# Patient Record
Sex: Female | Born: 1950
Health system: Southern US, Community
[De-identification: ages and names within clinical notes are randomized; demographics above are authoritative.]

## PROBLEM LIST (undated history)

## (undated) DIAGNOSIS — E785 Hyperlipidemia, unspecified: Secondary | ICD-10-CM

## (undated) DIAGNOSIS — E119 Type 2 diabetes mellitus without complications: Secondary | ICD-10-CM

## (undated) DIAGNOSIS — I10 Essential (primary) hypertension: Secondary | ICD-10-CM

## (undated) HISTORY — DX: Type 2 diabetes mellitus without complications: E11.9

## (undated) HISTORY — PX: EYE SURGERY: SHX253

## (undated) HISTORY — PX: LEG SURGERY: SHX1003

## (undated) HISTORY — DX: Hyperlipidemia, unspecified: E78.5

## (undated) HISTORY — DX: Essential (primary) hypertension: I10

## (undated) HISTORY — PX: TUBAL LIGATION: SHX77

---

## 2019-08-28 ENCOUNTER — Other Ambulatory Visit: Payer: Self-pay

## 2019-08-28 ENCOUNTER — Ambulatory Visit (INDEPENDENT_AMBULATORY_CARE_PROVIDER_SITE_OTHER): Payer: Medicare HMO | Admitting: Family Medicine

## 2019-08-28 ENCOUNTER — Encounter: Payer: Self-pay | Admitting: Family Medicine

## 2019-08-28 DIAGNOSIS — R6889 Other general symptoms and signs: Secondary | ICD-10-CM | POA: Diagnosis not present

## 2019-08-28 DIAGNOSIS — Z1231 Encounter for screening mammogram for malignant neoplasm of breast: Secondary | ICD-10-CM | POA: Diagnosis not present

## 2019-08-28 DIAGNOSIS — E1149 Type 2 diabetes mellitus with other diabetic neurological complication: Secondary | ICD-10-CM | POA: Diagnosis not present

## 2019-08-28 DIAGNOSIS — Z13820 Encounter for screening for osteoporosis: Secondary | ICD-10-CM | POA: Diagnosis not present

## 2019-08-28 DIAGNOSIS — E1140 Type 2 diabetes mellitus with diabetic neuropathy, unspecified: Secondary | ICD-10-CM | POA: Diagnosis not present

## 2019-08-28 DIAGNOSIS — Z1159 Encounter for screening for other viral diseases: Secondary | ICD-10-CM | POA: Diagnosis not present

## 2019-08-28 DIAGNOSIS — E1169 Type 2 diabetes mellitus with other specified complication: Secondary | ICD-10-CM | POA: Diagnosis not present

## 2019-08-28 DIAGNOSIS — I10 Essential (primary) hypertension: Secondary | ICD-10-CM | POA: Diagnosis not present

## 2019-08-28 DIAGNOSIS — E559 Vitamin D deficiency, unspecified: Secondary | ICD-10-CM | POA: Diagnosis not present

## 2019-08-28 DIAGNOSIS — E785 Hyperlipidemia, unspecified: Secondary | ICD-10-CM

## 2019-08-28 MED ORDER — GLIPIZIDE ER 5 MG PO TB24: 5.0000 mg | ORAL_TABLET | Freq: Every day | ORAL | 1 refills | Status: DC

## 2019-08-28 MED ORDER — GABAPENTIN 300 MG PO CAPS: 300.0000 mg | ORAL_CAPSULE | Freq: Three times a day (TID) | ORAL | 1 refills | Status: DC

## 2019-08-28 MED ORDER — PRAVASTATIN SODIUM 40 MG PO TABS: 40.0000 mg | ORAL_TABLET | Freq: Every day | ORAL | 1 refills | Status: DC

## 2019-08-28 MED ORDER — FARXIGA 5 MG PO TABS: 5.0000 mg | ORAL_TABLET | Freq: Every day | ORAL | 1 refills | Status: DC

## 2019-08-28 NOTE — Progress Notes (Signed)
Subjective:  Patient ID: Ghina Bittinger, female    DOB: [birth year]  Age: 69 y.o. MRN: [id number]  CC:  Chief Complaint  Patient presents with  . New Patient (Initial Visit)    establish care      HPI  HPI   Ms Donaldson is a 69 year old female patient who recently moved up here in November 2020 from [place].  She is now living with her daughter at this time.  She reports a history of smoking, diabetes, high blood pressure, high cholesterol, vitamin deficiency in the, obesity.  She reports that she is down 25 pounds since stopping Metformin.  Decided to stop that secondary to the side effects that she saw on recalls.  Does not recall if she was on extended release or not.  Has had a leg surgery and a tibial ligation.  She reports that she is doing really well with her legs she does not have any issues.  She has no mobility issues to report.  She is unable to tell me how many cigarettes she smokes today because she rolls her own.  Occasional beer or 2 but not a weekly event per her.  She likes to crochet and play apps on her phone.  She loves to eat seafood, eggs and veggies.  Limited fast food and red meat intake.  Does drink 2 pots of coffee daily.  And will drink a lot of sweet tea.  Does not drive anymore.  Health maintenance wise she declines having vaccines.  Is open to Cologuard/colonoscopy.  Bone scan, mammogram.  Need to get her to sign a release for her records so that we can make sure that we do not repeat anything that she has had in the past.  And that we try to get her up today as much as possible.  She reports she is run out of her medications now that it has been over 3 months that she moved up here.  Needs refills on those that are provided today.  She denies having any other concerns or issues at this time.  And is willing to get updated labs as well.  Today patient denies signs and symptoms of COVID 19 infection including fever, chills, cough, shortness  of breath, and headache. Past Medical, Surgical, Social History, Allergies, and Medications have been Reviewed.   Past Medical History:  Diagnosis Date  . Diabetes mellitus without complication ([place])   . Hyperlipidemia   . Hypertension     Current Meds  Medication Sig  . FARXIGA 5 MG TABS tablet Take 5 mg by mouth daily.  . fluticasone (FLONASE) 50 MCG/ACT nasal spray Place 1 spray into both nostrils 2 (two) times daily as needed for allergies or rhinitis.  Marland Kitchen gabapentin (NEURONTIN) 300 MG capsule Take 1 capsule (300 mg total) by mouth 3 (three) times daily.  Marland Kitchen glipiZIDE (GLUCOTROL XL) 5 MG 24 hr tablet Take 1 tablet (5 mg total) by mouth daily after breakfast.  . IBU 800 MG tablet Take 800 mg by mouth 3 (three) times daily.  . pravastatin (PRAVACHOL) 40 MG tablet Take 1 tablet (40 mg total) by mouth daily.  . [DISCONTINUED] FARXIGA 5 MG TABS tablet Take 5 mg by mouth daily.  . [DISCONTINUED] gabapentin (NEURONTIN) 300 MG capsule Take 300 mg by mouth in the morning, at noon, in the evening, and at bedtime.  . [DISCONTINUED] glipiZIDE (GLUCOTROL XL) 5 MG 24 hr tablet Take 5 mg by mouth 2 (two) times daily.  . [  DISCONTINUED] pravastatin (PRAVACHOL) 40 MG tablet Take 40 mg by mouth daily.    ROS:  Review of Systems  HENT: Negative.        Dentures -bottom and top   Eyes: Negative.        Sty on eye- healing  Glasses - just readers Cataract sx on both eyes   Respiratory: Negative.   Cardiovascular: Negative.   Gastrointestinal: Negative.   Genitourinary: Negative.   Musculoskeletal: Negative.   Skin: Negative.   Neurological: Negative.   Endo/Heme/Allergies: Negative.   Psychiatric/Behavioral: Negative.   All other systems reviewed and are negative.    Objective:   Today's Vitals: BP 118/78   Pulse 86   Temp (!) 97.2 F (36.2 C) (Temporal)   Resp 15   Ht 5' 5.5" (1.664 m)   Wt 226 lb 1.3 oz (102.5 kg)   SpO2 96%   BMI 37.05 kg/m  Vitals with BMI 08/28/2019  Height  5' 5.5"  Weight 226 lbs 1 oz  BMI 37.04  Systolic 118  Diastolic 78  Pulse 86     Physical Exam Vitals and nursing note reviewed.  Constitutional:      Appearance: Normal appearance. She is well-developed and well-groomed. She is morbidly obese.  HENT:     Head: Normocephalic and atraumatic.     Comments: Mask in place    Right Ear: External ear normal.     Left Ear: External ear normal.  Eyes:     General:        Right eye: No discharge.        Left eye: No discharge.     Conjunctiva/sclera: Conjunctivae normal.  Cardiovascular:     Rate and Rhythm: Normal rate and regular rhythm.     Pulses: Normal pulses.     Heart sounds: Normal heart sounds.  Pulmonary:     Effort: Pulmonary effort is normal.     Breath sounds: Normal breath sounds.  Musculoskeletal:        General: Normal range of motion.     Cervical back: Normal range of motion and neck supple.  Skin:    General: Skin is warm.  Neurological:     General: No focal deficit present.     Mental Status: She is alert and oriented to person, place, and time.  Psychiatric:        Attention and Perception: Attention normal.        Mood and Affect: Mood normal.        Speech: Speech normal.        Behavior: Behavior normal. Behavior is cooperative.        Thought Content: Thought content normal.        Cognition and Memory: Cognition normal.        Judgment: Judgment normal.     Assessment   1. Morbid obesity (HCC)   2. Type 2 diabetes mellitus with diabetic neuropathy, without long-term current use of insulin (HCC)   3. Hyperlipidemia associated with type 2 diabetes mellitus (HCC)   4. Other diabetic neurological complication associated with type 2 diabetes mellitus (HCC)   5. Essential hypertension   6. Vitamin D deficiency   7. Encounter for hepatitis C screening test for low risk patient   8. Encounter for imaging to assess osteoporosis   9. Encounter for screening mammogram for malignant neoplasm of breast      Tests ordered Orders Placed This Encounter  Procedures  . DG Bone Density  . MM  3D SCREEN BREAST BILATERAL  . CBC  . COMPLETE METABOLIC PANEL WITH GFR  . Hemoglobin A1c  . Lipid panel  . TSH  . VITAMIN D 25 Hydroxy (Vit-D Deficiency, Fractures)  . HEP C AB W/REFL     Plan: Please see assessment and plan per problem list above.   Meds ordered this encounter  Medications  . FARXIGA 5 MG TABS tablet    Sig: Take 5 mg by mouth daily.    Dispense:  90 tablet    Refill:  1    Order Specific Question:   Supervising Provider    Answer:   SIMPSON, MARGARET E [[id number]]  . gabapentin (NEURONTIN) 300 MG capsule    Sig: Take 1 capsule (300 mg total) by mouth 3 (three) times daily.    Dispense:  270 capsule    Refill:  1    Order Specific Question:   Supervising Provider    Answer:   SIMPSON, MARGARET E [[id number]]  . glipiZIDE (GLUCOTROL XL) 5 MG 24 hr tablet    Sig: Take 1 tablet (5 mg total) by mouth daily after breakfast.    Dispense:  90 tablet    Refill:  1    Order Specific Question:   Supervising Provider    Answer:   SIMPSON, MARGARET E [[id number]]  . pravastatin (PRAVACHOL) 40 MG tablet    Sig: Take 1 tablet (40 mg total) by mouth daily.    Dispense:  90 tablet    Refill:  1    Order Specific Question:   Supervising Provider    Answer:   Kerri Perches [[id number]]    Patient to follow-up in 3 months .  Freddy Finner, NP

## 2019-08-28 NOTE — Patient Instructions (Addendum)
Happy New Year! May you have a year filled with hope, love, happiness and laughter.  I appreciate the opportunity to provide you with care for your health and wellness. Today we discussed: establish care  Follow up: 3 month *sign release form for records  Labs in the next week fasting at [facility] (same floor, at end of hallway)  Bone Scan and Mammogram placed today.  Please continue to practice social distancing to keep you, your family, and our community safe.  If you must go out, please wear a mask and practice good handwashing.  It was a pleasure to see you and I look forward to continuing to work together on your health and well-being. Please do not hesitate to call the office if you need care or have questions about your care.  Have a wonderful day and week. With Gratitude, Tereasa Coop, DNP, AGNP-BC

## 2019-08-30 ENCOUNTER — Encounter: Payer: Self-pay | Admitting: Family Medicine

## 2019-08-30 DIAGNOSIS — E1149 Type 2 diabetes mellitus with other diabetic neurological complication: Secondary | ICD-10-CM

## 2019-08-30 DIAGNOSIS — E1140 Type 2 diabetes mellitus with diabetic neuropathy, unspecified: Secondary | ICD-10-CM | POA: Insufficient documentation

## 2019-08-30 DIAGNOSIS — Z1231 Encounter for screening mammogram for malignant neoplasm of breast: Secondary | ICD-10-CM | POA: Insufficient documentation

## 2019-08-30 DIAGNOSIS — Z13820 Encounter for screening for osteoporosis: Secondary | ICD-10-CM | POA: Insufficient documentation

## 2019-08-30 DIAGNOSIS — Z1159 Encounter for screening for other viral diseases: Secondary | ICD-10-CM

## 2019-08-30 DIAGNOSIS — I10 Essential (primary) hypertension: Secondary | ICD-10-CM | POA: Insufficient documentation

## 2019-08-30 DIAGNOSIS — E1169 Type 2 diabetes mellitus with other specified complication: Secondary | ICD-10-CM | POA: Insufficient documentation

## 2019-08-30 DIAGNOSIS — E559 Vitamin D deficiency, unspecified: Secondary | ICD-10-CM | POA: Insufficient documentation

## 2019-08-30 DIAGNOSIS — E785 Hyperlipidemia, unspecified: Secondary | ICD-10-CM | POA: Insufficient documentation

## 2019-08-30 HISTORY — DX: Encounter for screening for osteoporosis: Z13.820

## 2019-08-30 HISTORY — DX: Encounter for screening mammogram for malignant neoplasm of breast: Z12.31

## 2019-08-30 HISTORY — DX: Encounter for screening for other viral diseases: Z11.59

## 2019-08-30 HISTORY — DX: Type 2 diabetes mellitus with other diabetic neurological complication: E11.49

## 2019-08-30 NOTE — Assessment & Plan Note (Signed)
Needs updated labs-will prescribe supplement if needed

## 2019-08-30 NOTE — Assessment & Plan Note (Signed)
Madison Ho is encouraged to check blood sugar daily as directed. Continue current medications. Is on statin as well. Might need ACE pending labs Educated on importance of maintain a well balanced diabetic friendly diet.  She is reminded the importance of maintaining  good blood sugars,  taking medications as directed, daily foot care, annual eye exams. Additionally educated about keeping good control over blood pressure and cholesterol as well.

## 2019-08-30 NOTE — Assessment & Plan Note (Signed)
Needs refill on medications. Provided today. Advised tight control with BS to help keep this from getting worse.

## 2019-08-30 NOTE — Assessment & Plan Note (Signed)
She is not currently on anything for this, would benefit from ACE given she is DM. She wants to wait on labs.  Will address at that time.

## 2019-08-30 NOTE — Assessment & Plan Note (Signed)
US task force recommendation 

## 2019-08-30 NOTE — Assessment & Plan Note (Signed)
  Madison Ho is educated about the importance of exercise daily to help with weight management. A minumum of 30 minutes daily is recommended. Additionally, importance of healthy food choices  with portion control discussed.  Wt Readings from Last 3 Encounters:  08/28/19 226 lb 1.3 oz (102.5 kg)

## 2019-08-30 NOTE — Assessment & Plan Note (Signed)
Continue statin, will get updated labs, encouraged a heart healthy, low fat diet. 5 days of 30 minutes of exercise recommended.

## 2019-08-30 NOTE — Assessment & Plan Note (Signed)
Does not recall last one, maybe 2 years per her. Will order update.

## 2019-08-30 NOTE — Assessment & Plan Note (Signed)
Does not recall having this. Need records and update of scan.  Has fractured ribs by lending over a drawer.  Encouraged vitamin D and Ca+ Will be getting labs as well

## 2019-09-05 DIAGNOSIS — E119 Type 2 diabetes mellitus without complications: Secondary | ICD-10-CM | POA: Diagnosis not present

## 2019-09-05 DIAGNOSIS — Z6837 Body mass index (BMI) 37.0-37.9, adult: Secondary | ICD-10-CM | POA: Diagnosis not present

## 2019-09-05 DIAGNOSIS — E6609 Other obesity due to excess calories: Secondary | ICD-10-CM | POA: Diagnosis not present

## 2019-09-05 DIAGNOSIS — Z1159 Encounter for screening for other viral diseases: Secondary | ICD-10-CM | POA: Diagnosis not present

## 2019-09-05 DIAGNOSIS — R6889 Other general symptoms and signs: Secondary | ICD-10-CM | POA: Diagnosis not present

## 2019-09-05 DIAGNOSIS — E559 Vitamin D deficiency, unspecified: Secondary | ICD-10-CM | POA: Diagnosis not present

## 2019-09-05 DIAGNOSIS — I10 Essential (primary) hypertension: Secondary | ICD-10-CM | POA: Diagnosis not present

## 2019-09-06 LAB — HEMOGLOBIN A1C
Hgb A1c MFr Bld: 10.7 % of total Hgb — ABNORMAL HIGH (ref <5.7)
Mean Plasma Glucose: 260 (calc)
eAG (mmol/L): 14.4 (calc)

## 2019-09-06 LAB — COMPLETE METABOLIC PANEL WITH GFR
AG Ratio: 1.3 (calc) (ref 1.0–2.5)
ALT: 7 U/L (ref 6–29)
AST: 8 U/L — ABNORMAL LOW (ref 10–35)
Albumin: 3.9 g/dL (ref 3.6–5.1)
Alkaline phosphatase (APISO): 82 U/L (ref 37–153)
BUN: 13 mg/dL (ref 7–25)
CO2: 33 mmol/L — ABNORMAL HIGH (ref 20–32)
Calcium: 9.4 mg/dL (ref 8.6–10.4)
Chloride: 97 mmol/L — ABNORMAL LOW (ref 98–110)
Creat: 0.85 mg/dL (ref 0.60–0.93)
GFR, Est African American: 80 mL/min/{1.73_m2} (ref >=60)
GFR, Est Non African American: 69 mL/min/{1.73_m2} (ref >=60)
Globulin: 3.1 g/dL (calc) (ref 1.9–3.7)
Glucose, Bld: 321 mg/dL — ABNORMAL HIGH (ref 65–99)
Potassium: 4.9 mmol/L (ref 3.5–5.3)
Sodium: 136 mmol/L (ref 135–146)
Total Bilirubin: 0.3 mg/dL (ref 0.2–1.2)
Total Protein: 7 g/dL (ref 6.1–8.1)

## 2019-09-06 LAB — CBC
HCT: 47 % — ABNORMAL HIGH (ref 35.0–45.0)
Hemoglobin: 15.4 g/dL (ref 11.7–15.5)
MCH: 31.1 pg (ref 27.0–33.0)
MCHC: 32.8 g/dL (ref 32.0–36.0)
MCV: 94.9 fL (ref 80.0–100.0)
MPV: 10.8 fL (ref 7.5–12.5)
Platelets: 278 10*3/uL (ref 140–400)
RBC: 4.95 10*6/uL (ref 3.80–5.10)
RDW: 13.1 % (ref 11.0–15.0)
WBC: 10.1 10*3/uL (ref 3.8–10.8)

## 2019-09-06 LAB — LIPID PANEL
Cholesterol: 200 mg/dL — ABNORMAL HIGH (ref <200)
HDL: 46 mg/dL — ABNORMAL LOW (ref >=50)
LDL Cholesterol (Calc): 131 mg/dL (calc) — ABNORMAL HIGH
Non-HDL Cholesterol (Calc): 154 mg/dL (calc) — ABNORMAL HIGH (ref <130)
Total CHOL/HDL Ratio: 4.3 (calc) (ref <5.0)
Triglycerides: 120 mg/dL (ref <150)

## 2019-09-06 LAB — HEP C AB W/REFL
HEPATITIS C ANTIBODY REFILL$(REFL): NONREACTIVE
SIGNAL TO CUT-OFF: 0.01 (ref <1.00)

## 2019-09-06 LAB — VITAMIN D 25 HYDROXY (VIT D DEFICIENCY, FRACTURES): Vit D, 25-Hydroxy: 10 ng/mL — ABNORMAL LOW (ref 30–100)

## 2019-09-06 LAB — TSH: TSH: 1.12 mIU/L (ref 0.40–4.50)

## 2019-09-06 LAB — REFLEX TIQ

## 2019-09-24 ENCOUNTER — Other Ambulatory Visit: Payer: Self-pay | Admitting: Family Medicine

## 2019-10-20 ENCOUNTER — Ambulatory Visit (INDEPENDENT_AMBULATORY_CARE_PROVIDER_SITE_OTHER): Payer: Medicare HMO

## 2019-10-20 ENCOUNTER — Other Ambulatory Visit: Payer: Self-pay

## 2019-10-20 VITALS — BP 118/78 | Ht 65.5 in | Wt 226.0 lb

## 2019-10-20 DIAGNOSIS — Z0000 Encounter for general adult medical examination without abnormal findings: Secondary | ICD-10-CM

## 2019-10-20 NOTE — Progress Notes (Deleted)
Subjective:   Madison Ho is a 69 y.o. female who presents for an Initial Medicare Annual Wellness Visit.  Review of Systems    ***        Objective:    There were no vitals filed for this visit. There is no height or weight on file to calculate BMI.  No flowsheet data found.  Current Medications (verified) Outpatient Encounter Medications as of 10/20/2019  Medication Sig  . Blood Glucose Monitoring Suppl (ACCU-CHEK AVIVA PLUS) w/Device KIT   . FARXIGA 5 MG TABS tablet Take 5 mg by mouth daily.  . fluticasone (FLONASE) 50 MCG/ACT nasal spray Place 1 spray into both nostrils 2 (two) times daily as needed for allergies or rhinitis.  Marland Kitchen gabapentin (NEURONTIN) 300 MG capsule Take 1 capsule (300 mg total) by mouth 3 (three) times daily.  Marland Kitchen glipiZIDE (GLUCOTROL XL) 5 MG 24 hr tablet Take 1 tablet (5 mg total) by mouth daily after breakfast.  . IBU 800 MG tablet Take 800 mg by mouth 3 (three) times daily.  . pravastatin (PRAVACHOL) 40 MG tablet Take 1 tablet (40 mg total) by mouth daily.   No facility-administered encounter medications on file as of 10/20/2019.    Allergies (verified) Penicillins   History: Past Medical History:  Diagnosis Date  . Diabetes mellitus without complication ([place])   . Hyperlipidemia   . Hypertension    Past Surgical History:  Procedure Laterality Date  . LEG SURGERY    . TUBAL LIGATION     Family History  Problem Relation Age of Onset  . Stroke Mother   . Heart attack Mother   . Heart attack Father    Social History   Socioeconomic History  . Marital status: Widowed    Spouse name: Not on file  . Number of children: 2  . Years of education: Not on file  . Highest education level: Bachelor's degree (e.g., BA, AB, BS)  Occupational History  . Not on file  Tobacco Use  . Smoking status: Current Every Day Smoker  . Smokeless tobacco: Never Used  Substance and Sexual Activity  . Alcohol use: Yes    Comment: occas  . Drug use:  Never  . Sexual activity: Not Currently  Other Topics Concern  . Not on file  Social History Narrative   Lives with Lynelle Smoke recently moved from [place] in nov 2020   Grandson and her son       Cats      Enjoy: crochet, play merge dragons phone apps game       Diet: loves seafood, eggs, veggies, chicken and sides, limited fast food and red meat   Caffeine: 2 pots of coffee daily, drink a lot of sweet tea, (root beer)   Water: 3 cups daily at least       Wears seat belt   Smoke and carbon monoxide detectors    Does not drive   Social Determinants of Health   Financial Resource Strain: Low Risk   . Difficulty of Paying Living Expenses: Not very hard  Food Insecurity: No Food Insecurity  . Worried About Charity fundraiser in the Last Year: Never true  . Ran Out of Food in the Last Year: Never true  Transportation Needs: No Transportation Needs  . Lack of Transportation (Medical): No  . Lack of Transportation (Non-Medical): No  Physical Activity: Inactive  . Days of Exercise per Week: 0 days  . Minutes of Exercise per Session: 0 min  Stress: No Stress Concern Present  . Feeling of Stress : Not at all  Social Connections: Moderately Isolated  . Frequency of Communication with Friends and Family: More than three times a week  . Frequency of Social Gatherings with Friends and Family: More than three times a week  . Attends Religious Services: Never  . Active Member of Clubs or Organizations: No  . Attends Archivist Meetings: Never  . Marital Status: Widowed    Tobacco Counseling Ready to quit: Not Answered Counseling given: Not Answered   Clinical Intake:                        Activities of Daily Living No flowsheet data found.   Immunizations and Health Maintenance  There is no immunization history on file for this patient. Health Maintenance Due  Topic Date Due  . Hepatitis C Screening  Never done  . FOOT EXAM  Never done  . OPHTHALMOLOGY  EXAM  Never done  . URINE MICROALBUMIN  Never done  . TETANUS/TDAP  Never done  . MAMMOGRAM  Never done  . COLONOSCOPY  Never done  . DEXA SCAN  Never done    Patient Care Team: Perlie Mayo, NP as PCP - General (Family Medicine)  Indicate any recent Medical Services you may have received from other than [facility] providers in the past year (date may be approximate).     Assessment:   This is a routine wellness examination for [name].  Hearing/Vision screen No exam data present  Dietary issues and exercise activities discussed:    Goals   None    Depression Screen PHQ 2/9 Scores 08/28/2019  PHQ - 2 Score 0    Fall Risk Fall Risk  08/28/2019  Falls in the past year? 1  Number falls in past yr: 1  Injury with Fall? 1    Is the patient's home free of loose throw rugs in walkways, pet beds, electrical cords, etc?   {Blank single:19197::"yes","no"}      Grab bars in the bathroom? {Blank single:19197::"yes","no"}      Handrails on the stairs?   {Blank single:19197::"yes","no"}      Adequate lighting?   {Blank single:19197::"yes","no"}  Timed Get Up and Go Performed ***  Cognitive Function:        Screening Tests Health Maintenance  Topic Date Due  . Hepatitis C Screening  Never done  . FOOT EXAM  Never done  . OPHTHALMOLOGY EXAM  Never done  . URINE MICROALBUMIN  Never done  . TETANUS/TDAP  Never done  . MAMMOGRAM  Never done  . COLONOSCOPY  Never done  . DEXA SCAN  Never done  . INFLUENZA VACCINE  10/29/2019 (Originally 03/01/2019)  . PNA vac Low Risk Adult (1 of 2 - PCV13) 08/27/2020 (Originally 06/19/2014)  . HEMOGLOBIN A1C  03/04/2020    Qualifies for Shingles Vaccine? ***  Cancer Screenings: Lung: Low Dose CT Chest recommended if Age 56-80 years, 30 pack-year currently smoking OR have quit w/in 15years. Patient {DOES NOT does:27190::"does not"} qualify. Breast: Up to date on Mammogram? {Yes/No:30480221}   Up to date of Bone Density/Dexa?  {Yes/No:30480221} Colorectal: ***  Additional Screenings: *** Hepatitis C Screening:      Plan:   ***  I have personally reviewed and noted the following in the patient's chart:   . Medical and social history . Use of alcohol, tobacco or illicit drugs  . Current medications and supplements . Functional ability  and status . Nutritional status . Physical activity . Advanced directives . List of other physicians . Hospitalizations, surgeries, and ER visits in previous 12 months . Vitals . Screenings to include cognitive, depression, and falls . Referrals and appointments  In addition, I have reviewed and discussed with patient certain preventive protocols, quality metrics, and best practice recommendations. A written personalized care plan for preventive services as well as general preventive health recommendations were provided to patient.     Kate Sable, LPN, LPN   [id number]

## 2019-11-10 ENCOUNTER — Ambulatory Visit (INDEPENDENT_AMBULATORY_CARE_PROVIDER_SITE_OTHER): Payer: Medicare HMO

## 2019-11-10 ENCOUNTER — Other Ambulatory Visit: Payer: Self-pay

## 2019-11-10 VITALS — BP 118/78 | Ht 65.5 in | Wt 226.0 lb

## 2019-11-10 DIAGNOSIS — Z0000 Encounter for general adult medical examination without abnormal findings: Secondary | ICD-10-CM | POA: Diagnosis not present

## 2019-11-10 MED ORDER — FLUTICASONE PROPIONATE 50 MCG/ACT NA SUSP: 1.0000 | Freq: Two times a day (BID) | NASAL | 1 refills | Status: DC | PRN

## 2019-11-10 NOTE — Patient Instructions (Signed)
Madison Ho , Thank you for taking time to come for your Medicare Wellness Visit. I appreciate your ongoing commitment to your health goals. Please review the following plan we discussed and let me know if I can assist you in the future.   Screening recommendations/referrals: Colonoscopy: need to sign records release  Mammogram:  Needs to schedule mammo for mid May  Bone Density: will call if decides she wants Recommended yearly ophthalmology/optometry visit for glaucoma screening and checkup Recommended yearly dental visit for hygiene and checkup  Vaccinations: Influenza vaccine: declined  Pneumococcal vaccine: declined  Tdap vaccine: declined Shingles vaccine: declined   Advanced directives: will mail with avs   Conditions/risks identified: fall risk  Next appointment: 11/26/2019 1pm   Preventive Care 65 Years and Older, Female Preventive care refers to lifestyle choices and visits with your health care provider that can promote health and wellness. What does preventive care include?  A yearly physical exam. This is also called an annual well check.  Dental exams once or twice a year.  Routine eye exams. Ask your health care provider how often you should have your eyes checked.  Personal lifestyle choices, including:  Daily care of your teeth and gums.  Regular physical activity.  Eating a healthy diet.  Avoiding tobacco and drug use.  Limiting alcohol use.  Practicing safe sex.  Taking low-dose aspirin every day.  Taking vitamin and mineral supplements as recommended by your health care provider. What happens during an annual well check? The services and screenings done by your health care provider during your annual well check will depend on your age, overall health, lifestyle risk factors, and family history of disease. Counseling  Your health care provider may ask you questions about your:  Alcohol use.  Tobacco use.  Drug use.  Emotional  well-being.  Home and relationship well-being.  Sexual activity.  Eating habits.  History of falls.  Memory and ability to understand (cognition).  Work and work Astronomer.  Reproductive health. Screening  You may have the following tests or measurements:  Height, weight, and BMI.  Blood pressure.  Lipid and cholesterol levels. These may be checked every 5 years, or more frequently if you are over 60 years old.  Skin check.  Lung cancer screening. You may have this screening every year starting at age 6 if you have a 30-pack-year history of smoking and currently smoke or have quit within the past 15 years.  Fecal occult blood test (FOBT) of the stool. You may have this test every year starting at age 56.  Flexible sigmoidoscopy or colonoscopy. You may have a sigmoidoscopy every 5 years or a colonoscopy every 10 years starting at age 33.  Hepatitis C blood test.  Hepatitis B blood test.  Sexually transmitted disease (STD) testing.  Diabetes screening. This is done by checking your blood sugar (glucose) after you have not eaten for a while (fasting). You may have this done every 1-3 years.  Bone density scan. This is done to screen for osteoporosis. You may have this done starting at age 35.  Mammogram. This may be done every 1-2 years. Talk to your health care provider about how often you should have regular mammograms. Talk with your health care provider about your test results, treatment options, and if necessary, the need for more tests. Vaccines  Your health care provider may recommend certain vaccines, such as:  Influenza vaccine. This is recommended every year.  Tetanus, diphtheria, and acellular pertussis (Tdap, Td) vaccine. You  may need a Td booster every 10 years.  Zoster vaccine. You may need this after age 27.  Pneumococcal 13-valent conjugate (PCV13) vaccine. One dose is recommended after age 30.  Pneumococcal polysaccharide (PPSV23) vaccine. One  dose is recommended after age 65. Talk to your health care provider about which screenings and vaccines you need and how often you need them. This information is not intended to replace advice given to you by your health care provider. Make sure you discuss any questions you have with your health care provider. Document Released: 08/13/2015 Document Revised: 04/05/2016 Document Reviewed: 05/18/2015 Elsevier Interactive Patient Education  2017 [place] Prevention in the Home Falls can cause injuries. They can happen to people of all ages. There are many things you can do to make your home safe and to help prevent falls. What can I do on the outside of my home?  Regularly fix the edges of walkways and driveways and fix any cracks.  Remove anything that might make you trip as you walk through a door, such as a raised step or threshold.  Trim any bushes or trees on the path to your home.  Use bright outdoor lighting.  Clear any walking paths of anything that might make someone trip, such as rocks or tools.  Regularly check to see if handrails are loose or broken. Make sure that both sides of any steps have handrails.  Any raised decks and porches should have guardrails on the edges.  Have any leaves, snow, or ice cleared regularly.  Use sand or salt on walking paths during winter.  Clean up any spills in your garage right away. This includes oil or grease spills. What can I do in the bathroom?  Use night lights.  Install grab bars by the toilet and in the tub and shower. Do not use towel bars as grab bars.  Use non-skid mats or decals in the tub or shower.  If you need to sit down in the shower, use a plastic, non-slip stool.  Keep the floor dry. Clean up any water that spills on the floor as soon as it happens.  Remove soap buildup in the tub or shower regularly.  Attach bath mats securely with double-sided non-slip rug tape.  Do not have throw rugs and other  things on the floor that can make you trip. What can I do in the bedroom?  Use night lights.  Make sure that you have a light by your bed that is easy to reach.  Do not use any sheets or blankets that are too big for your bed. They should not hang down onto the floor.  Have a firm chair that has side arms. You can use this for support while you get dressed.  Do not have throw rugs and other things on the floor that can make you trip. What can I do in the kitchen?  Clean up any spills right away.  Avoid walking on wet floors.  Keep items that you use a lot in easy-to-reach places.  If you need to reach something above you, use a strong step stool that has a grab bar.  Keep electrical cords out of the way.  Do not use floor polish or wax that makes floors slippery. If you must use wax, use non-skid floor wax.  Do not have throw rugs and other things on the floor that can make you trip. What can I do with my stairs?  Do not leave any items  on the stairs.  Make sure that there are handrails on both sides of the stairs and use them. Fix handrails that are broken or loose. Make sure that handrails are as long as the stairways.  Check any carpeting to make sure that it is firmly attached to the stairs. Fix any carpet that is loose or worn.  Avoid having throw rugs at the top or bottom of the stairs. If you do have throw rugs, attach them to the floor with carpet tape.  Make sure that you have a light switch at the top of the stairs and the bottom of the stairs. If you do not have them, ask someone to add them for you. What else can I do to help prevent falls?  Wear shoes that:  Do not have high heels.  Have rubber bottoms.  Are comfortable and fit you well.  Are closed at the toe. Do not wear sandals.  If you use a stepladder:  Make sure that it is fully opened. Do not climb a closed stepladder.  Make sure that both sides of the stepladder are locked into place.  Ask  someone to hold it for you, if possible.  Clearly mark and make sure that you can see:  Any grab bars or handrails.  First and last steps.  Where the edge of each step is.  Use tools that help you move around (mobility aids) if they are needed. These include:  Canes.  Walkers.  Scooters.  Crutches.  Turn on the lights when you go into a dark area. Replace any light bulbs as soon as they burn out.  Set up your furniture so you have a clear path. Avoid moving your furniture around.  If any of your floors are uneven, fix them.  If there are any pets around you, be aware of where they are.  Review your medicines with your doctor. Some medicines can make you feel dizzy. This can increase your chance of falling. Ask your doctor what other things that you can do to help prevent falls. This information is not intended to replace advice given to you by your health care provider. Make sure you discuss any questions you have with your health care provider. Document Released: 05/13/2009 Document Revised: 12/23/2015 Document Reviewed: 08/21/2014 Elsevier Interactive Patient Education  2017 [employer].

## 2019-11-10 NOTE — Progress Notes (Signed)
Subjective:   Madison Ho is a 69 y.o. female who presents for an Initial Medicare Annual Wellness Visit.  Review of Systems      Cardiac Risk Factors include: advanced age (>57mn, >>35women);diabetes mellitus;dyslipidemia;hypertension;obesity (BMI >30kg/m2);sedentary lifestyle;smoking/ tobacco exposure     Objective:    Today's Vitals   11/10/19 1616 11/10/19 1638  BP: 118/78   Weight: 226 lb (102.5 kg)   Height: 5' 5.5" (1.664 m)   PainSc: 0-No pain 0-No pain   Body mass index is 37.04 kg/m.  No flowsheet data found.  Current Medications (verified) Outpatient Encounter Medications as of 11/10/2019  Medication Sig  . Blood Glucose Monitoring Suppl (ACCU-CHEK AVIVA PLUS) w/Device KIT   . FARXIGA 5 MG TABS tablet Take 5 mg by mouth daily.  . fluticasone (FLONASE) 50 MCG/ACT nasal spray Place 1 spray into both nostrils 2 (two) times daily as needed for allergies or rhinitis.  .Marland Kitchengabapentin (NEURONTIN) 300 MG capsule Take 1 capsule (300 mg total) by mouth 3 (three) times daily.  .Marland KitchenglipiZIDE (GLUCOTROL XL) 5 MG 24 hr tablet Take 1 tablet (5 mg total) by mouth daily after breakfast.  . IBU 800 MG tablet Take 800 mg by mouth 3 (three) times daily.  . pravastatin (PRAVACHOL) 40 MG tablet Take 1 tablet (40 mg total) by mouth daily.  . [DISCONTINUED] fluticasone (FLONASE) 50 MCG/ACT nasal spray Place 1 spray into both nostrils 2 (two) times daily as needed for allergies or rhinitis.   No facility-administered encounter medications on file as of 11/10/2019.    Allergies (verified) Penicillins   History: Past Medical History:  Diagnosis Date  . Diabetes mellitus without complication ([place]   . Hyperlipidemia   . Hypertension    Past Surgical History:  Procedure Laterality Date  . LEG SURGERY    . TUBAL LIGATION     Family History  Problem Relation Age of Onset  . Stroke Mother   . Heart attack Mother   . Heart attack Father    Social History   Socioeconomic  History  . Marital status: Widowed    Spouse name: Not on file  . Number of children: 2  . Years of education: Not on file  . Highest education level: Bachelor's degree (e.g., BA, AB, BS)  Occupational History  . Not on file  Tobacco Use  . Smoking status: Current Every Day Smoker  . Smokeless tobacco: Never Used  Substance and Sexual Activity  . Alcohol use: Yes    Comment: occas  . Drug use: Never  . Sexual activity: Not Currently  Other Topics Concern  . Not on file  Social History Narrative   [place]   Grandson and her son       Cats      Enjoy: crochet, play merge dragons phone apps game       Diet: loves seafood, eggs, veggies, chicken and sides, limited fast food and red meat   Caffeine: 2 pots of coffee daily, drink a lot of sweet tea, (root beer)   Water: 3 cups daily at least       Wears seat belt   Ho and carbon monoxide detectors    Does not drive   Social Determinants of Health   Financial Resource Strain: Low Risk   . Difficulty of Paying Living Expenses: Not very hard  Food Insecurity: No Food Insecurity  . Worried About RCharity fundraiserin the  Last Year: Never true  . Ran Out of Food in the Last Year: Never true  Transportation Needs: No Transportation Needs  . Lack of Transportation (Medical): No  . Lack of Transportation (Non-Medical): No  Physical Activity: Insufficiently Active  . Days of Exercise per Week: 2 days  . Minutes of Exercise per Session: 10 min  Stress: No Stress Concern Present  . Feeling of Stress : Not at all  Social Connections: Moderately Isolated  . Frequency of Communication with Friends and Family: More than three times a week  . Frequency of Social Gatherings with Friends and Family: More than three times a week  . Attends Religious Services: Never  . Active Member of Clubs or Organizations: No  . Attends Archivist Meetings: Never  . Marital Status: Widowed     Tobacco Counseling Ready to quit: Not Answered Counseling given: Not Answered   Clinical Intake:  Pre-visit preparation completed: No  Pain : No/denies pain Pain Score: 0-No pain     Nutritional Status: BMI > 30  Obese Diabetes: Yes CBG done?: No Did pt. bring in CBG monitor from home?: No  How often do you need to have someone help you when you read instructions, pamphlets, or other written materials from your doctor or pharmacy?: 1 - Never         Activities of Daily Living In your present state of health, do you have any difficulty performing the following activities: 11/10/2019  Hearing? N  Vision? N  Difficulty concentrating or making decisions? N  Walking or climbing stairs? Y  Dressing or bathing? N  Doing errands, shopping? N  Preparing Food and eating ? N  Using the Toilet? N  In the past six months, have you accidently leaked urine? N  Do you have problems with loss of bowel control? N  Managing your Medications? N  Managing your Finances? N  Housekeeping or managing your Housekeeping? N     Immunizations and Health Maintenance  There is no immunization history on file for this patient. Health Maintenance Due  Topic Date Due  . Hepatitis C Screening  Never done  . FOOT EXAM  Never done  . OPHTHALMOLOGY EXAM  Never done  . URINE MICROALBUMIN  Never done  . TETANUS/TDAP  Never done  . MAMMOGRAM  Never done  . COLONOSCOPY  Never done  . DEXA SCAN  Never done    Patient Care Team: Perlie Mayo, NP as PCP - General (Family Medicine)  Indicate any recent Medical Services you may have received from other than [facility] providers in the past year (date may be approximate).     Assessment:   This is a routine wellness examination for Madison Ho.  Hearing/Vision screen No exam data present  Dietary issues and exercise activities discussed: Current Exercise Habits: Home exercise routine, Time (Minutes): 15, Frequency (Times/Week): 3, Weekly Exercise  (Minutes/Week): 45, Intensity: Mild  Goals    . DIET - EAT MORE FRUITS AND VEGETABLES    . DIET - INCREASE WATER INTAKE    . LIFESTYLE - DECREASE FALLS RISK      Depression Screen PHQ 2/9 Scores 11/10/2019 08/28/2019  PHQ - 2 Score 0 0    Fall Risk Fall Risk  11/10/2019 08/28/2019  Falls in the past year? 1 1  Number falls in past yr: 0 1  Injury with Fall? 0 1    Is the patient's home free of loose throw rugs in walkways, pet beds, electrical cords,  etc?   no      Grab bars in the bathroom? yes      Handrails on the stairs?   yes      Adequate lighting?   yes  Timed Get Up and Go Performed virtual visit   Cognitive Function:     6CIT Screen 11/10/2019  What Year? 0 points  What month? 0 points  What time? 0 points  Count back from 20 0 points  Months in reverse 2 points  Repeat phrase 0 points  Total Score 2    Screening Tests Health Maintenance  Topic Date Due  . Hepatitis C Screening  Never done  . FOOT EXAM  Never done  . OPHTHALMOLOGY EXAM  Never done  . URINE MICROALBUMIN  Never done  . TETANUS/TDAP  Never done  . MAMMOGRAM  Never done  . COLONOSCOPY  Never done  . DEXA SCAN  Never done  . PNA vac Low Risk Adult (1 of 2 - PCV13) 08/27/2020 (Originally 06/19/2014)  . INFLUENZA VACCINE  02/29/2020  . HEMOGLOBIN A1C  03/04/2020    Qualifies for Shingles Vaccine? declined  Cancer Screenings: Lung: Low Dose CT Chest recommended if Age 40-80 years, 30 pack-year currently smoking OR have quit w/in 15years. Patient does qualify. Breast: Up to date on Mammogram? Yes   Up to date of Bone Density/Dexa? Yes Colorectal:   Additional Screenings: Hepatitis C Screening: does not want     Plan:    I have personally reviewed and noted the following in the patient's chart:   . Medical and social history . Use of alcohol, tobacco or illicit drugs  . Current medications and supplements . Functional ability and status . Nutritional status . Physical activity .  Advanced directives . List of other physicians . Hospitalizations, surgeries, and ER visits in previous 12 months . Vitals . Screenings to include cognitive, depression, and falls . Referrals and appointments  In addition, I have reviewed and discussed with patient certain preventive protocols, quality metrics, and best practice recommendations. A written personalized care plan for preventive services as well as general preventive health recommendations were provided to patient.     Kate Sable, LPN, LPN   [id number]

## 2019-11-11 ENCOUNTER — Telehealth: Payer: Self-pay

## 2019-11-11 NOTE — Telephone Encounter (Signed)
Has appt at the end of the month and at her AWV yesterday she wanted me to see if you wanted her to go back on the metformin because her sugars were still elevated and you had taken her off of it and she wasn't sure why.

## 2019-11-11 NOTE — Telephone Encounter (Signed)
She said she was taking both the glipizide and the farxiga.

## 2019-11-11 NOTE — Telephone Encounter (Signed)
In review, she was not on this at her first visit with me.I did not stop it from the chart she has never been on it from the Epic side of things. I am happy to restart her on this, if she would like. Is she taking the glipizide?

## 2019-11-11 NOTE — Telephone Encounter (Signed)
Lets start metformin 500 mg TID with meals.

## 2019-11-12 ENCOUNTER — Other Ambulatory Visit: Payer: Self-pay

## 2019-11-12 DIAGNOSIS — E1149 Type 2 diabetes mellitus with other diabetic neurological complication: Secondary | ICD-10-CM

## 2019-11-12 MED ORDER — METFORMIN HCL 500 MG PO TABS: 500.0000 mg | ORAL_TABLET | Freq: Three times a day (TID) | ORAL | 1 refills | Status: DC

## 2019-11-12 NOTE — Telephone Encounter (Signed)
Med sent and messsage left for her and lab order mailed

## 2019-11-26 ENCOUNTER — Ambulatory Visit: Payer: Medicare HMO | Admitting: Family Medicine

## 2019-12-04 ENCOUNTER — Encounter: Payer: Self-pay | Admitting: Family Medicine

## 2019-12-04 ENCOUNTER — Ambulatory Visit: Payer: Medicare HMO | Admitting: Family Medicine

## 2019-12-14 DIAGNOSIS — I87312 Chronic venous hypertension (idiopathic) with ulcer of left lower extremity: Secondary | ICD-10-CM | POA: Diagnosis not present

## 2019-12-14 DIAGNOSIS — I87313 Chronic venous hypertension (idiopathic) with ulcer of bilateral lower extremity: Secondary | ICD-10-CM | POA: Diagnosis not present

## 2019-12-14 DIAGNOSIS — I87311 Chronic venous hypertension (idiopathic) with ulcer of right lower extremity: Secondary | ICD-10-CM | POA: Diagnosis not present

## 2019-12-14 DIAGNOSIS — L97909 Non-pressure chronic ulcer of unspecified part of unspecified lower leg with unspecified severity: Secondary | ICD-10-CM | POA: Diagnosis not present

## 2019-12-14 DIAGNOSIS — I83009 Varicose veins of unspecified lower extremity with ulcer of unspecified site: Secondary | ICD-10-CM | POA: Diagnosis not present

## 2019-12-14 DIAGNOSIS — L03115 Cellulitis of right lower limb: Secondary | ICD-10-CM | POA: Diagnosis not present

## 2019-12-14 DIAGNOSIS — E119 Type 2 diabetes mellitus without complications: Secondary | ICD-10-CM | POA: Diagnosis not present

## 2019-12-14 DIAGNOSIS — L03116 Cellulitis of left lower limb: Secondary | ICD-10-CM | POA: Diagnosis not present

## 2019-12-14 DIAGNOSIS — L0390 Cellulitis, unspecified: Secondary | ICD-10-CM | POA: Diagnosis not present

## 2019-12-15 ENCOUNTER — Other Ambulatory Visit: Payer: Self-pay

## 2019-12-15 ENCOUNTER — Ambulatory Visit (HOSPITAL_COMMUNITY)
Admission: RE | Admit: 2019-12-15 | Discharge: 2019-12-15 | Disposition: A | Discharge status: Home / Self Care | Payer: Medicare HMO | Source: Ambulatory Visit | Attending: Family Medicine | Admitting: Family Medicine

## 2019-12-15 DIAGNOSIS — Z1231 Encounter for screening mammogram for malignant neoplasm of breast: Secondary | ICD-10-CM | POA: Insufficient documentation

## 2019-12-15 DIAGNOSIS — R6889 Other general symptoms and signs: Secondary | ICD-10-CM | POA: Diagnosis not present

## 2019-12-17 ENCOUNTER — Ambulatory Visit (INDEPENDENT_AMBULATORY_CARE_PROVIDER_SITE_OTHER): Payer: Medicare HMO | Admitting: Family Medicine

## 2019-12-17 ENCOUNTER — Encounter: Payer: Self-pay | Admitting: Family Medicine

## 2019-12-17 ENCOUNTER — Other Ambulatory Visit: Payer: Self-pay

## 2019-12-17 VITALS — BP 133/75 | HR 79 | Temp 97.1°F | Ht 65.5 in | Wt 233.0 lb

## 2019-12-17 DIAGNOSIS — L97219 Non-pressure chronic ulcer of right calf with unspecified severity: Secondary | ICD-10-CM | POA: Diagnosis not present

## 2019-12-17 DIAGNOSIS — E1149 Type 2 diabetes mellitus with other diabetic neurological complication: Secondary | ICD-10-CM | POA: Diagnosis not present

## 2019-12-17 DIAGNOSIS — R6889 Other general symptoms and signs: Secondary | ICD-10-CM | POA: Diagnosis not present

## 2019-12-17 DIAGNOSIS — I10 Essential (primary) hypertension: Secondary | ICD-10-CM

## 2019-12-17 DIAGNOSIS — I83012 Varicose veins of right lower extremity with ulcer of calf: Secondary | ICD-10-CM

## 2019-12-17 NOTE — Progress Notes (Signed)
Subjective:  Patient ID: Madison Ho, female    DOB: May 15, 1951  Age: 69 y.o. MRN: [id number]  CC:  Chief Complaint  Patient presents with  . Follow-up    3 month follow up pt right leg has open sore on it was seen in ER 12-15-19 was given an antibiotic but was told to follow up by primary also needs a walker with a seat as she gives out easily       HPI  HPI Ms. Person is a 69 year old female patient of mine. Who presents today after being seen in [place] [facility]. [place]. She presented to the ED stating that the wound to the right lower leg with purulent drainage has had it since 2018 but it opened back up a week ago. Had right lower extremity venous stasis ulcer. In the past she been treated with Cipro. She also has chronic peripheral edema. She opted not to get IV treatment and to do trial of outpatient treatment. She received intramuscular antibiotics followed by p.o. outpatient antibiotics. Which she reports that she has not started yet. She was given clindamycin IM and a prescription.  She reports taking all of her medications as directed and without any issue.   Today patient denies signs and symptoms of COVID 19 infection including fever, chills, cough, shortness of breath, and headache. Past Medical, Surgical, Social History, Allergies, and Medications have been Reviewed.   Past Medical History:  Diagnosis Date  . Diabetes mellitus without complication ([place])   . Hyperlipidemia   . Hypertension     Current Meds  Medication Sig  . Blood Glucose Monitoring Suppl (ACCU-CHEK AVIVA PLUS) w/Device KIT   . clindamycin (CLEOCIN) 150 MG capsule Take by mouth.  Marland Kitchen FARXIGA 5 MG TABS tablet Take 5 mg by mouth daily.  . fluticasone (FLONASE) 50 MCG/ACT nasal spray Place 1 spray into both nostrils 2 (two) times daily as needed for allergies or rhinitis.  Marland Kitchen gabapentin (NEURONTIN) 300 MG capsule Take 1 capsule (300 mg total) by mouth 3 (three) times daily.  Marland Kitchen glipiZIDE  (GLUCOTROL XL) 5 MG 24 hr tablet Take 1 tablet (5 mg total) by mouth daily after breakfast.  . IBU 800 MG tablet Take 800 mg by mouth 3 (three) times daily.  . metFORMIN (GLUCOPHAGE) 500 MG tablet Take 1 tablet (500 mg total) by mouth with breakfast, with lunch, and with evening meal.  . pravastatin (PRAVACHOL) 40 MG tablet Take 1 tablet (40 mg total) by mouth daily.    ROS:  Review of Systems  Constitutional: Negative.   HENT: Negative.   Eyes: Negative.   Respiratory: Negative.   Cardiovascular: Negative.   Gastrointestinal: Negative.   Genitourinary: Negative.   Musculoskeletal:       See HPI  Skin: Negative.   Neurological: Negative.   Endo/Heme/Allergies: Negative.   Psychiatric/Behavioral: Negative.   All other systems reviewed and are negative.    Objective:   Today's Vitals: BP 133/75 (BP Location: Right Arm, Patient Position: Sitting, Cuff Size: Normal)   Pulse 79   Temp (!) 97.1 F (36.2 C) (Temporal)   Ht 5' 5.5" (1.664 m)   Wt 233 lb (105.7 kg)   SpO2 91%   BMI 38.18 kg/m  Vitals with BMI 12/17/2019 11/10/2019 10/20/2019  Height 5' 5.5" 5' 5.5" 5' 5.5"  Weight 233 lbs 226 lbs 226 lbs  BMI 38.17 22.48 25.00  Systolic 370 488 891  Diastolic 75 78 78  Pulse 79 - -  Physical Exam Vitals and nursing note reviewed.  Constitutional:      Appearance: Normal appearance. She is well-developed and well-groomed. She is obese.  HENT:     Head: Normocephalic and atraumatic.     Right Ear: External ear normal.     Left Ear: External ear normal.     Mouth/Throat:     Comments: Mask in place Eyes:     General:        Right eye: No discharge.        Left eye: No discharge.     Conjunctiva/sclera: Conjunctivae normal.  Cardiovascular:     Rate and Rhythm: Normal rate and regular rhythm.     Pulses: Normal pulses.     Heart sounds: Normal heart sounds.  Pulmonary:     Effort: Pulmonary effort is normal.     Breath sounds: Normal breath sounds.   Musculoskeletal:        General: Normal range of motion.     Cervical back: Normal range of motion and neck supple.  Skin:    General: Skin is warm.     Comments: Bilateral lower extremity chronic edema with venous stasis ulcer in the right lower extremity draining with small surrounding cellulitis  Neurological:     General: No focal deficit present.     Mental Status: She is alert and oriented to person, place, and time.  Psychiatric:        Attention and Perception: Attention normal.        Mood and Affect: Mood normal.        Speech: Speech normal.        Behavior: Behavior normal. Behavior is cooperative.        Thought Content: Thought content normal.        Cognition and Memory: Cognition normal.        Judgment: Judgment normal.      Assessment   1. Venous stasis ulcer of right calf, unspecified ulcer stage, unspecified whether varicose veins present ([place])   2. Essential hypertension   3. Type 2 diabetes mellitus with neurological complications ([place])     Tests ordered Orders Placed This Encounter  Procedures  . For home use only DME 4 wheeled rolling walker with seat ([id number])     Plan: Please see assessment and plan per problem list above.   No orders of the defined types were placed in this encounter.   Patient to follow-up in 01/16/2020   Perlie Mayo, NP

## 2019-12-17 NOTE — Patient Instructions (Signed)
I appreciate the opportunity to provide you with care for your health and wellness. Today we discussed: leg wound  Follow up: 30 days for leg wound  No labs or referrals today  Take medications for leg infection. If worse go to ED infection: fevers, chills, redness going up leg  Avoid cream and lotion.  Please continue to practice social distancing to keep you, your family, and our community safe.  If you must go out, please wear a mask and practice good handwashing.  It was a pleasure to see you and I look forward to continuing to work together on your health and well-being. Please do not hesitate to call the office if you need care or have questions about your care.  Have a wonderful day and week. With Gratitude, Tereasa Coop, DNP, AGNP-BC

## 2019-12-19 NOTE — Assessment & Plan Note (Addendum)
Encouraged to continue checking blood sugar daily. Is on a statin medication. Would like to get her on an ACE inhibitor. Reports she would like to wait till next appointment to do this.  She is reminded to maintain good blood sugars, take medication as directed, daily foot care, annual eye exams. And to maintain good blood pressure

## 2019-12-19 NOTE — Assessment & Plan Note (Signed)
Treatment given at the emergency room. We will follow-up in 30 days to see if she is started to heal. She declined wanting any wound care or referral to vascular surgery.

## 2019-12-19 NOTE — Assessment & Plan Note (Signed)
She is not currently taking anything would benefit from an ACE given she is a diabetic she wants to wait for the next appointment and will address at that time. This is already been delayed by several months.

## 2019-12-24 ENCOUNTER — Encounter: Payer: Self-pay | Admitting: Family Medicine

## 2019-12-24 NOTE — Telephone Encounter (Signed)
Was seen on 5/19 and wants to know what you want to do about her leg ulcer

## 2019-12-29 DIAGNOSIS — L97911 Non-pressure chronic ulcer of unspecified part of right lower leg limited to breakdown of skin: Secondary | ICD-10-CM | POA: Diagnosis not present

## 2019-12-29 DIAGNOSIS — L97919 Non-pressure chronic ulcer of unspecified part of right lower leg with unspecified severity: Secondary | ICD-10-CM | POA: Diagnosis not present

## 2019-12-29 DIAGNOSIS — R600 Localized edema: Secondary | ICD-10-CM | POA: Diagnosis not present

## 2019-12-29 DIAGNOSIS — R229 Localized swelling, mass and lump, unspecified: Secondary | ICD-10-CM | POA: Diagnosis not present

## 2020-01-13 ENCOUNTER — Other Ambulatory Visit: Payer: Self-pay

## 2020-01-13 MED ORDER — UNABLE TO FIND: 0 refills | Status: DC

## 2020-01-16 ENCOUNTER — Ambulatory Visit: Payer: Medicare HMO | Admitting: Family Medicine

## 2020-01-22 ENCOUNTER — Other Ambulatory Visit: Payer: Self-pay

## 2020-01-22 ENCOUNTER — Ambulatory Visit (INDEPENDENT_AMBULATORY_CARE_PROVIDER_SITE_OTHER): Payer: Medicare HMO | Admitting: Family Medicine

## 2020-01-22 ENCOUNTER — Encounter: Payer: Self-pay | Admitting: Family Medicine

## 2020-01-22 VITALS — BP 134/73 | HR 73 | Temp 97.3°F | Ht 65.0 in | Wt 229.1 lb

## 2020-01-22 DIAGNOSIS — I83012 Varicose veins of right lower extremity with ulcer of calf: Secondary | ICD-10-CM | POA: Diagnosis not present

## 2020-01-22 DIAGNOSIS — L97219 Non-pressure chronic ulcer of right calf with unspecified severity: Secondary | ICD-10-CM

## 2020-01-22 DIAGNOSIS — R6889 Other general symptoms and signs: Secondary | ICD-10-CM | POA: Diagnosis not present

## 2020-01-22 NOTE — Patient Instructions (Addendum)
I appreciate the opportunity to provide you with care for your health and wellness. Today we discussed: leg wound   Follow up: 8 weeks   No labs  Referrals today wound clinic in [facility] for Dan Humphreys resent to [facility]   Please continue to keep the wound clean and dressed.  Please continue to practice social distancing to keep you, your family, and our community safe.  If you must go out, please wear a mask and practice good handwashing.  It was a pleasure to see you and I look forward to continuing to work together on your health and well-being. Please do not hesitate to call the office if you need care or have questions about your care.  Have a wonderful day and week. With Gratitude, Tereasa Coop, DNP, AGNP-BC

## 2020-01-22 NOTE — Progress Notes (Signed)
Subjective:  Patient ID: Madison Ho, female    DOB: 02/07/1951  Age: 69 y.o. MRN: [id number]  CC:  Chief Complaint  Patient presents with  . Follow-up    right leg wound pt feels like its better its not as dark as it was she thinks it was much worse       HPI  HPI   Madison Ho is a pleasant 69 year old female patient of mine.  Who presents today for follow-up on the leg wound.  Was seen about a month ago for venous stasis ulcer of the right calf on the medial side.  This looks to be down to the the fascia if not further.  Is willing to do wound clinic.  Did do a treatment course of antibiotics without much relief.  Has been seen by emergency room as well.  They have told her that it was not infected but she definitely needed to get wound care treatment and would have to get that order from me.  Previously did not want to do the wound care if antibiotic was available and could help.  She appeared to have some cellulitis when I first met her she was started on antibiotic the redness has improved to some of the swelling.  However the open area appears to be a little bit worse draining serous sanguinous fluid There is still some increased warmth and redness noted.  There is foul odor coming from the site.  Pulses still present 2+ with cap refill present less than 3.   Today patient denies signs and symptoms of COVID 19 infection including fever, chills, cough, shortness of breath, and headache. Past Medical, Surgical, Social History, Allergies, and Medications have been Reviewed.   Past Medical History:  Diagnosis Date  . Diabetes mellitus without complication ([place])   . Hyperlipidemia   . Hypertension     Current Meds  Medication Sig  . Blood Glucose Monitoring Suppl (ACCU-CHEK AVIVA PLUS) w/Device KIT   . FARXIGA 5 MG TABS tablet Take 5 mg by mouth daily.  . fluticasone (FLONASE) 50 MCG/ACT nasal spray Place 1 spray into both nostrils 2 (two) times daily as needed for  allergies or rhinitis.  Marland Kitchen gabapentin (NEURONTIN) 300 MG capsule Take 1 capsule (300 mg total) by mouth 3 (three) times daily.  Marland Kitchen glipiZIDE (GLUCOTROL XL) 5 MG 24 hr tablet Take 1 tablet (5 mg total) by mouth daily after breakfast.  . IBU 800 MG tablet Take 800 mg by mouth 3 (three) times daily.  . metFORMIN (GLUCOPHAGE) 500 MG tablet Take 1 tablet (500 mg total) by mouth with breakfast, with lunch, and with evening meal.  . pravastatin (PRAVACHOL) 40 MG tablet Take 1 tablet (40 mg total) by mouth daily.  Marland Kitchen UNABLE TO FIND Accu-Chek Softclix Lacet Kit  Use as directed to check blood sugar once daily  . UNABLE TO FIND Accu-Chek Aviva Solution   Use as directed    ROS:  Review of Systems  HENT: Negative.   Eyes: Negative.   Respiratory: Negative.   Cardiovascular: Negative.   Gastrointestinal: Negative.   Genitourinary: Negative.   Musculoskeletal: Negative.   Skin: Negative.        See HPI  Neurological: Negative.   Endo/Heme/Allergies: Negative.   Psychiatric/Behavioral: Negative.   All other systems reviewed and are negative.    Objective:   Today's Vitals: BP 134/73 (BP Location: Right Arm, Patient Position: Sitting, Cuff Size: Normal)   Pulse 73   Temp Marland Kitchen)  97.3 F (36.3 C) (Temporal)   Ht '5\' 5"'  (1.651 m)   Wt 229 lb 1.9 oz (103.9 kg)   SpO2 91%   BMI 38.13 kg/m  Vitals with BMI 01/22/2020 12/17/2019 11/10/2019  Height '5\' 5"'  5' 5.5" 5' 5.5"  Weight 229 lbs 2 oz 233 lbs 226 lbs  BMI 38.13 99.35 70.17  Systolic 793 903 009  Diastolic 73 75 78  Pulse 73 79 -     Physical Exam Vitals and nursing note reviewed.  Constitutional:      Appearance: Normal appearance. She is well-developed and well-groomed. She is obese.  HENT:     Head: Normocephalic and atraumatic.     Right Ear: External ear normal.     Left Ear: External ear normal.     Mouth/Throat:     Comments: Mask in place Eyes:     General:        Right eye: No discharge.        Left eye: No discharge.      Conjunctiva/sclera: Conjunctivae normal.  Cardiovascular:     Rate and Rhythm: Normal rate and regular rhythm.     Pulses: Normal pulses.     Heart sounds: Normal heart sounds.  Pulmonary:     Effort: Pulmonary effort is normal.     Breath sounds: Normal breath sounds.  Musculoskeletal:        General: Normal range of motion.     Cervical back: Normal range of motion and neck supple.  Skin:    General: Skin is warm.     Findings: Erythema and wound present.     Comments: Ulceration on medial RLE Drainage serous sanguinous Noted yellow tissue red granular tissue Mild swelling   Neurological:     General: No focal deficit present.     Mental Status: She is alert and oriented to person, place, and time.  Psychiatric:        Attention and Perception: Attention normal.        Mood and Affect: Mood normal.        Speech: Speech normal.        Behavior: Behavior normal. Behavior is cooperative.        Thought Content: Thought content normal.        Cognition and Memory: Cognition normal.        Judgment: Judgment normal.    Dressing change performed.   Assessment   1. Venous stasis ulcer of right calf, unspecified ulcer stage, unspecified whether varicose veins present ([place])     Tests ordered No orders of the defined types were placed in this encounter.    Plan: Please see assessment and plan per problem list above.   No orders of the defined types were placed in this encounter.   Patient to follow-up in 8 weeks  Perlie Mayo, NP

## 2020-02-03 ENCOUNTER — Encounter: Payer: Self-pay | Admitting: Family Medicine

## 2020-02-04 ENCOUNTER — Other Ambulatory Visit: Payer: Self-pay | Admitting: *Deleted

## 2020-02-04 DIAGNOSIS — E1140 Type 2 diabetes mellitus with diabetic neuropathy, unspecified: Secondary | ICD-10-CM

## 2020-02-04 MED ORDER — FARXIGA 5 MG PO TABS: 5.0000 mg | ORAL_TABLET | Freq: Every day | ORAL | 1 refills | Status: DC

## 2020-02-04 MED ORDER — METFORMIN HCL 500 MG PO TABS: 500.0000 mg | ORAL_TABLET | Freq: Three times a day (TID) | ORAL | 1 refills | Status: DC

## 2020-02-10 ENCOUNTER — Encounter: Payer: Self-pay | Admitting: Family Medicine

## 2020-02-10 NOTE — Telephone Encounter (Signed)
Try sending this referral to [facility]  [facility] he is new to [employer]

## 2020-02-16 ENCOUNTER — Ambulatory Visit: Payer: Medicare HMO

## 2020-02-16 ENCOUNTER — Other Ambulatory Visit: Payer: Self-pay

## 2020-02-16 LAB — HM DIABETES EYE EXAM

## 2020-02-18 ENCOUNTER — Ambulatory Visit: Payer: Medicare HMO | Admitting: Cardiothoracic Surgery

## 2020-02-18 DIAGNOSIS — R6889 Other general symptoms and signs: Secondary | ICD-10-CM | POA: Diagnosis not present

## 2020-02-25 ENCOUNTER — Ambulatory Visit (INDEPENDENT_AMBULATORY_CARE_PROVIDER_SITE_OTHER): Payer: Medicare HMO | Admitting: Cardiothoracic Surgery

## 2020-02-25 ENCOUNTER — Other Ambulatory Visit: Payer: Self-pay

## 2020-02-25 ENCOUNTER — Encounter: Payer: Self-pay | Admitting: Cardiothoracic Surgery

## 2020-02-25 VITALS — BP 129/83 | HR 86 | Temp 97.6°F | Resp 18 | Ht 65.0 in | Wt 226.0 lb

## 2020-02-25 DIAGNOSIS — I83012 Varicose veins of right lower extremity with ulcer of calf: Secondary | ICD-10-CM | POA: Diagnosis not present

## 2020-02-25 DIAGNOSIS — L97211 Non-pressure chronic ulcer of right calf limited to breakdown of skin: Secondary | ICD-10-CM

## 2020-02-25 DIAGNOSIS — R6889 Other general symptoms and signs: Secondary | ICD-10-CM | POA: Diagnosis not present

## 2020-02-25 NOTE — Progress Notes (Signed)
Patient ID: Madison Ho, female   DOB: 1950-12-07, 69 y.o.   MRN: [id number]  Chief Complaint  Patient presents with  . New Patient (Initial Visit)    Wound on right lower leg    Referred By Tereasa Coop, NP Reason for Referral lower extremity wound  HPI Location, Quality, Duration, Severity, Timing, Context, Modifying Factors, Associated Signs and Symptoms.  Madison Ho is a 69 y.o. female.  She states that she moved to the [place] area from [place] several months ago and establish care here.  Her problems began about 4 5 years ago when she had an episode where a dog chain was wrapped around her right lower extremity resulting in a wound around her ankle.  This healed but over the next several months it broke down again when she placed some lotion on her leg.  She states that she did see several physicians for it and the wound has healed in the past.  She has been prescribed compression garments but does not wear them.  She states they are too uncomfortable and she cannot get them on.  She presents now with a right lower extremity wound which is several months in duration.  She has been treating this with homemade Dakin solution.  She saw several emergency room physicians for this wound and has been placed on antibiotics at least twice.  She has been treated with clindamycin and ciprofloxacin.  She also carries a diagnosis of psoriasis and has extensive psoriasis on her right foot some on her left foot as well as her ankles and scalp.  She is a diabetic.  She states that she checks her blood sugars and that they range between 120 and 150.  She also has a smoker.  She states that she rolls her own cigarettes and rolls about 20 to 30/day.   Past Medical History:  Diagnosis Date  . Diabetes mellitus without complication ([facility])   . Hyperlipidemia   . Hypertension     Past Surgical History:  Procedure Laterality Date  . LEG SURGERY    . TUBAL LIGATION      Family History   Problem Relation Age of Onset  . Stroke Mother   . Heart attack Mother   . Heart attack Father     Social History Social History   Tobacco Use  . Smoking status: Current Every Day Smoker  . Smokeless tobacco: Never Used  Substance Use Topics  . Alcohol use: Yes    Comment: occas  . Drug use: Never    Allergies  Allergen Reactions  . Penicillins Swelling    Has taken cipro with no problems    Current Outpatient Medications  Medication Sig Dispense Refill  . Blood Glucose Monitoring Suppl (ACCU-CHEK AVIVA PLUS) w/Device KIT     . FARXIGA 5 MG TABS tablet Take 1 tablet (5 mg total) by mouth daily. 90 tablet 1  . fluticasone (FLONASE) 50 MCG/ACT nasal spray Place 1 spray into both nostrils 2 (two) times daily as needed for allergies or rhinitis. 48 g 1  . gabapentin (NEURONTIN) 300 MG capsule Take 1 capsule (300 mg total) by mouth 3 (three) times daily. 270 capsule 1  . glipiZIDE (GLUCOTROL XL) 5 MG 24 hr tablet Take 1 tablet (5 mg total) by mouth daily after breakfast. 90 tablet 1  . IBU 800 MG tablet Take 800 mg by mouth 3 (three) times daily.    . metFORMIN (GLUCOPHAGE) 500 MG tablet Take 1 tablet (500 mg total)  by mouth with breakfast, with lunch, and with evening meal. 270 tablet 1  . pravastatin (PRAVACHOL) 40 MG tablet Take 1 tablet (40 mg total) by mouth daily. 90 tablet 1  . UNABLE TO FIND Accu-Chek Softclix Lacet Kit  Use as directed to check blood sugar once daily 1 each 0  . UNABLE TO FIND Accu-Chek Aviva Solution   Use as directed 1 each 0   No current facility-administered medications for this visit.      Review of Systems A complete review of systems was asked and was negative except for the following positive findings no pain in her legs when she walks.  Blood pressure (!) 129/83, pulse 86, temperature 97.6 F (36.4 C), temperature source Temporal, resp. rate 18, height 5' 5" (1.651 m), weight (!) 226 lb (102.5 kg), SpO2 93 %.  Physical  Exam CONSTITUTIONAL:  Pleasant, well-developed, well-nourished, and in no acute distress. RESPIRATORY:  Lungs were clear.  Normal respiratory effort without pathologic use of accessory muscles of respiration CARDIOVASCULAR: Heart was regular without murmurs.  There were no carotid bruits. GI: The abdomen was soft, nontender, and nondistended. There were no palpable masses. There was no hepatosplenomegaly. There were normal bowel sounds in all quadrants. GU:  Rectal deferred.   MUSCULOSKELETAL:  Normal muscle strength and tone.  No clubbing or cyanosis.   SKIN:  There were lesions on both lower extremities consistent with psoriasis.  There were no nodules on palpation. NEUROLOGIC:  Sensation is normal.  Cranial nerves are grossly intact. PSYCH:  Oriented to person, place and time.  Mood and affect are normal. She did have palpable pulses in her foot.  The wound measures approximately 3 cm and has a moderate amount of yellow slough.  There is some surrounding dermatitic changes.  There is no purulence.   Data Reviewed Laboratory studies from February showing a glucose of over 300  I have personally reviewed the patient's imaging, laboratory findings and medical records.    Assessment    At the present time I think that she most likely has a venous stasis ulcer.  The patient also has diabetes and a history of smoking.    Plan    We will plan on obtaining some lower extremity vascular studies to rule out any significant arterial inflow disease.  We will use Hydrofera Blue and wrap her leg.  We instructed her on changing this weekly here in the office.  We will try to schedule her test for soon as possible and they may be removed at that time for the studies.  Otherwise she will see [place] again in 1 week.       Nestor Lewandowsky, MD 02/25/2020, 9:59 AM   New Patient, Wound care  Right medial lower leg (suspected venous ulcer, full thickness) Measurements: 2.5x4x0.3 Wound Bed Description: 50%  yellow and 50% pink/red, small amount of serosanguinous drainage.  Treatment: Cleansed wound with anacept and gauze, applied TCA to periwound, moisturizer lotion to leg, Hydrofera Blue-Ready applied to wound bed, wrapped lightly with kerlix and coban.   Leg Circumference: Calf=42.5 cm and Ankle=25.7 cm  MD ordered Arterial Dopplers (including ABIs and TBIs to be done).   Follow-up appointment in one week

## 2020-02-27 ENCOUNTER — Other Ambulatory Visit: Payer: Self-pay | Admitting: Family Medicine

## 2020-02-27 DIAGNOSIS — L97211 Non-pressure chronic ulcer of right calf limited to breakdown of skin: Secondary | ICD-10-CM

## 2020-02-27 DIAGNOSIS — I83012 Varicose veins of right lower extremity with ulcer of calf: Secondary | ICD-10-CM

## 2020-03-03 ENCOUNTER — Encounter: Payer: Self-pay | Admitting: Cardiothoracic Surgery

## 2020-03-03 ENCOUNTER — Ambulatory Visit (INDEPENDENT_AMBULATORY_CARE_PROVIDER_SITE_OTHER): Payer: Medicare HMO | Admitting: Cardiothoracic Surgery

## 2020-03-03 ENCOUNTER — Other Ambulatory Visit: Payer: Self-pay

## 2020-03-03 VITALS — BP 124/78 | HR 94 | Temp 98.3°F

## 2020-03-03 DIAGNOSIS — L97211 Non-pressure chronic ulcer of right calf limited to breakdown of skin: Secondary | ICD-10-CM

## 2020-03-03 DIAGNOSIS — R6889 Other general symptoms and signs: Secondary | ICD-10-CM | POA: Diagnosis not present

## 2020-03-03 DIAGNOSIS — I83012 Varicose veins of right lower extremity with ulcer of calf: Secondary | ICD-10-CM | POA: Diagnosis not present

## 2020-03-03 NOTE — Progress Notes (Signed)
Follow-up Wound Care  Right medial lower leg (suspected venous ulcer, full thickness) Measurements: 2x3.4x0.3, smaller in length and width. Wound Bed Description: 25% yellow and 75% pink/red, small amount of serosanguinous drainage.  Treatment: Cleansed wound with anacept and gauze, cleansed leg with antibacterial soap and water, applied hydrofera blue-ready to wound bed and covered with 4x4 and kerlix due to arterial studies scheduled for tomorrow.   Patient educated on how to change dressing until return appointment next week.  Leg Circumference: Calf=45 cm and Ankle=26 cm  Arterial Dopplers (including ABIs and TBIs to be done) scheduled for tomorrow.   Follow-up appointment in one week  She returns today in follow-up of her leg wound.  She has no new complaints today.  She states that she has been compliant with her dressing although today's wound did appear to be somewhat macerated and the dressings were wet.  The details are outlined above.  The wound itself does appear to have some new skin at the edges.  The wound bed is mostly pink with a small amount of slough present.  There is no erythema.  There is no discharge from the wound.  I did check her posterior tibial and dorsalis pedis pulses with the Doppler.  The dorsalis pedis pulse appeared normal.  The posterior tibial had high velocities.  She is scheduled to have her lower extremity vascular studies performed tomorrow.  Therefore we will continue the current dressing regimen.  She will we will see her back again in 1 week.

## 2020-03-04 ENCOUNTER — Ambulatory Visit (HOSPITAL_COMMUNITY)
Admission: RE | Admit: 2020-03-04 | Discharge: 2020-03-04 | Disposition: A | Discharge status: Home / Self Care | Payer: Medicare HMO | Source: Ambulatory Visit | Attending: Cardiothoracic Surgery | Admitting: Cardiothoracic Surgery

## 2020-03-04 DIAGNOSIS — R6889 Other general symptoms and signs: Secondary | ICD-10-CM | POA: Diagnosis not present

## 2020-03-04 DIAGNOSIS — L97211 Non-pressure chronic ulcer of right calf limited to breakdown of skin: Secondary | ICD-10-CM | POA: Insufficient documentation

## 2020-03-04 DIAGNOSIS — I83012 Varicose veins of right lower extremity with ulcer of calf: Secondary | ICD-10-CM | POA: Diagnosis not present

## 2020-03-04 DIAGNOSIS — L97212 Non-pressure chronic ulcer of right calf with fat layer exposed: Secondary | ICD-10-CM | POA: Diagnosis not present

## 2020-03-10 ENCOUNTER — Encounter: Payer: Self-pay | Admitting: Cardiothoracic Surgery

## 2020-03-10 ENCOUNTER — Ambulatory Visit (INDEPENDENT_AMBULATORY_CARE_PROVIDER_SITE_OTHER): Payer: Medicare HMO | Admitting: Cardiothoracic Surgery

## 2020-03-10 VITALS — BP 150/91 | HR 88 | Temp 99.0°F | Resp 20 | Ht 65.5 in | Wt 228.0 lb

## 2020-03-10 DIAGNOSIS — I83012 Varicose veins of right lower extremity with ulcer of calf: Secondary | ICD-10-CM

## 2020-03-10 DIAGNOSIS — R6889 Other general symptoms and signs: Secondary | ICD-10-CM | POA: Diagnosis not present

## 2020-03-10 DIAGNOSIS — L97211 Non-pressure chronic ulcer of right calf limited to breakdown of skin: Secondary | ICD-10-CM

## 2020-03-10 NOTE — Progress Notes (Signed)
Follow-up Wound Care  Arterial Studies from 03/04/2020: Right ABI:1.21 Left ABI: 1.23  Right medial lower leg (suspected venous ulcer, full thickness) Measurements: 2x3.3x0.3, smaller in length and width. Wound Bed Description: 15% yellow and 85% pink/red, small amount of serosanguinous drainage.  Treatment: Cleansed wound with anacept and gauze, cleansed leg with antibacterial soap and water, applied hydrofera blue-ready, unna boot with kerlix/coban to leg.   Leg Circumference: Calf=45.5 cm and Ankle=35.5 cm    Follow-up appointment in one week  She returns today in follow-up.  She did have her lower extremity arterial studies performed.  These revealed no evidence of arterial disease.  The wound on her lower extremity is slightly smaller.  There does appear to be more swelling of her lower extremity this week.  The wound is described above.  It is mostly pink with a good granulating base.  There is no surrounding erythema or discharge from the wound.  There is no undermining.  I have independently reviewed her lower extremity arterial studies and agree that there does not appear to be any significant arterial disease.  She does have psoriasis over her large extent of her right foot and for this reason we have elected to use a Unna boot which may provide some relief from her psoriasis as well.  We will use Hydrofera Blue on the wound itself.  She will come back to see [place] again in 1 week.

## 2020-03-17 ENCOUNTER — Other Ambulatory Visit: Payer: Self-pay

## 2020-03-17 ENCOUNTER — Encounter: Payer: Self-pay | Admitting: Cardiothoracic Surgery

## 2020-03-17 ENCOUNTER — Ambulatory Visit (INDEPENDENT_AMBULATORY_CARE_PROVIDER_SITE_OTHER): Payer: Medicare HMO | Admitting: Cardiothoracic Surgery

## 2020-03-17 VITALS — BP 133/85 | HR 88 | Temp 98.6°F | Resp 18 | Ht 65.5 in | Wt 223.0 lb

## 2020-03-17 DIAGNOSIS — I83012 Varicose veins of right lower extremity with ulcer of calf: Secondary | ICD-10-CM

## 2020-03-17 DIAGNOSIS — R6889 Other general symptoms and signs: Secondary | ICD-10-CM | POA: Diagnosis not present

## 2020-03-17 DIAGNOSIS — L97211 Non-pressure chronic ulcer of right calf limited to breakdown of skin: Secondary | ICD-10-CM | POA: Diagnosis not present

## 2020-03-17 NOTE — Progress Notes (Signed)
Follow-up Wound Care  Arterial Studies from 03/04/2020: Right ABI:1.21 Left ABI: 1.23  Right medial lower leg (suspected venous ulcer, full thickness) Measurements: 1.4x2x0.1, smaller in length,width, and depth. Wound Bed Description:15% yellow and85% pink/red, small amount of serosanguinous drainage.  Treatment: Cleansed wound with anacept and gauze,cleansed leg with antibacterial soap and water, applied hydrofera blue-ready, unna boot with kerlix/coban to leg.   Leg Circumference: Calf=45.5cm and Ankle=35.5cm   Follow-up appointment in one week  She returns today in follow-up.  She has no new complaints.  She had the same dressing applied today when she returned.  She has been very compliant with that.  The wound is measured as above.  It is smaller and has an excellent granulating base.  There is only a small amount of yellowish wound exudate and a small amount of serosanguineous drainage.  There is no surrounding erythema or purulent discharge.  We have cleansed the wound and will apply Hydrofera Blue and an Radio broadcast assistant.  She will come back to see [place] again in 1 week.

## 2020-03-18 ENCOUNTER — Encounter: Payer: Self-pay | Admitting: Family Medicine

## 2020-03-18 ENCOUNTER — Ambulatory Visit (INDEPENDENT_AMBULATORY_CARE_PROVIDER_SITE_OTHER): Payer: Medicare HMO | Admitting: Family Medicine

## 2020-03-18 VITALS — BP 122/76 | HR 75 | Resp 16 | Ht 65.0 in | Wt 222.1 lb

## 2020-03-18 DIAGNOSIS — E1169 Type 2 diabetes mellitus with other specified complication: Secondary | ICD-10-CM | POA: Diagnosis not present

## 2020-03-18 DIAGNOSIS — B372 Candidiasis of skin and nail: Secondary | ICD-10-CM | POA: Insufficient documentation

## 2020-03-18 DIAGNOSIS — E785 Hyperlipidemia, unspecified: Secondary | ICD-10-CM

## 2020-03-18 DIAGNOSIS — R6889 Other general symptoms and signs: Secondary | ICD-10-CM | POA: Diagnosis not present

## 2020-03-18 DIAGNOSIS — E559 Vitamin D deficiency, unspecified: Secondary | ICD-10-CM | POA: Diagnosis not present

## 2020-03-18 DIAGNOSIS — E1140 Type 2 diabetes mellitus with diabetic neuropathy, unspecified: Secondary | ICD-10-CM | POA: Diagnosis not present

## 2020-03-18 DIAGNOSIS — I83012 Varicose veins of right lower extremity with ulcer of calf: Secondary | ICD-10-CM | POA: Diagnosis not present

## 2020-03-18 DIAGNOSIS — L97211 Non-pressure chronic ulcer of right calf limited to breakdown of skin: Secondary | ICD-10-CM

## 2020-03-18 MED ORDER — NYSTATIN 100000 UNIT/GM EX POWD: 1.0000 "application " | Freq: Two times a day (BID) | CUTANEOUS | 3 refills | Status: DC

## 2020-03-18 NOTE — Assessment & Plan Note (Signed)
Vitamin D ordered for follow-up annual appointment

## 2020-03-18 NOTE — Assessment & Plan Note (Signed)
Madison Ho is encouraged to check blood sugar daily as directed. Continue current medications. Is on statin as well. Educated on importance of maintain a well balanced diabetic friendly diet.  She is reminded the importance of maintaining  good blood sugars,  taking medications as directed, daily foot care, annual eye exams. Additionally educated about keeping good control over blood pressure and cholesterol as well.

## 2020-03-18 NOTE — Progress Notes (Signed)
Subjective:  Patient ID: Madison Ho, female    DOB: 1950-08-12  Age: 69 y.o. MRN: [id number]  CC:  Chief Complaint  Patient presents with  . Venous stasis ulcer of right calf    8 week follow up      HPI  HPI Madison Ho is a 69 year old female patient who has a history of diabetes without complication, hypertension, hyper lipidemia, morbid obesity, vitamin D deficiency, GERD, venous stasis ulcer.  She presents today for follow-up on venous stasis ulcer of the right calf.  She is now being followed closely by [facility].  Dr. Genevive Bi.  She is doing well it is improving greatly.  Follows with him every week.  She does not want to wear the compression socks she keeps her legs quite elevated.  Only complaint today is that she is having some yeast dermatitis up underneath the pannus and would like to get some more nystatin powder she has not had any in some time.  When the sweating occurs more frequently it creates an environment for yeast and she says it is hard to keep dry.  She also wants to mention that she does eat that she does not eat normally she eats when she gets hungry if she feels like it and if she is not hungry she does not eat.  Slight appetite change.  Only 6 pound weight loss in the last month.  Denies having any changes in bowel or bladder habits.  Today patient denies signs and symptoms of COVID 19 infection including fever, chills, cough, shortness of breath, and headache. Past Medical, Surgical, Social History, Allergies, and Medications have been Reviewed.   Past Medical History:  Diagnosis Date  . Diabetes mellitus without complication ([place])   . Encounter for hepatitis C screening test for low risk patient 08/30/2019  . Encounter for imaging to assess osteoporosis 08/30/2019  . Encounter for screening mammogram for malignant neoplasm of breast 08/30/2019  . Hyperlipidemia   . Hypertension   . Type 2 diabetes mellitus with neurological complications  ([place]) 7/97/2820    Current Meds  Medication Sig  . Blood Glucose Monitoring Suppl (ACCU-CHEK AVIVA PLUS) w/Device KIT   . FARXIGA 5 MG TABS tablet Take 1 tablet (5 mg total) by mouth daily.  . fluticasone (FLONASE) 50 MCG/ACT nasal spray Place 1 spray into both nostrils 2 (two) times daily as needed for allergies or rhinitis.  Marland Kitchen gabapentin (NEURONTIN) 300 MG capsule Take 1 capsule (300 mg total) by mouth 3 (three) times daily.  Marland Kitchen glipiZIDE (GLUCOTROL XL) 5 MG 24 hr tablet Take 1 tablet (5 mg total) by mouth daily after breakfast.  . IBU 800 MG tablet Take 800 mg by mouth 3 (three) times daily.  . metFORMIN (GLUCOPHAGE) 500 MG tablet Take 1 tablet (500 mg total) by mouth with breakfast, with lunch, and with evening meal.  . pravastatin (PRAVACHOL) 40 MG tablet Take 1 tablet (40 mg total) by mouth daily.  Marland Kitchen UNABLE TO FIND Accu-Chek Softclix Lacet Kit  Use as directed to check blood sugar once daily  . UNABLE TO FIND Accu-Chek Aviva Solution   Use as directed    ROS:  Review of Systems  Constitutional: Positive for weight loss.  HENT: Negative.   Eyes: Negative.   Respiratory: Negative.   Cardiovascular: Negative.   Gastrointestinal: Negative.   Genitourinary: Negative.   Musculoskeletal: Negative.   Skin: Negative.        Wound  Neurological: Negative.  Endo/Heme/Allergies: Negative.   Psychiatric/Behavioral: Negative.   All other systems reviewed and are negative.    Objective:   Today's Vitals: BP 122/76   Pulse 75   Resp 16   Ht _0  (1.651 m)   Wt 222 lb 1.9 oz (100.8 kg)   SpO2 97%   BMI 36.96 kg/m  Vitals with BMI 03/18/2020 03/17/2020 03/10/2020  Height _1  5' 5.5" 5' 5.5"  Weight 222 lbs 2 oz 223 lbs 228 lbs  BMI 36.96 52.84 13.24  Systolic 401 027 253  Diastolic 76 85 91  Pulse 75 88 88     Physical Exam Vitals and nursing note reviewed.  Constitutional:      Appearance: Normal appearance. She is well-developed and well-groomed. She is morbidly obese.   HENT:     Head: Normocephalic and atraumatic.     Right Ear: External ear normal.     Left Ear: External ear normal.     Nose: Nose normal.     Mouth/Throat:     Mouth: Mucous membranes are moist.     Pharynx: Oropharynx is clear.  Eyes:     General:        Right eye: No discharge.        Left eye: No discharge.     Conjunctiva/sclera: Conjunctivae normal.  Cardiovascular:     Rate and Rhythm: Normal rate and regular rhythm.     Pulses: Normal pulses.     Heart sounds: Normal heart sounds.  Pulmonary:     Effort: Pulmonary effort is normal.     Breath sounds: Normal air entry. Examination of the right-middle field reveals wheezing. Examination of the right-lower field reveals wheezing. Wheezing present.  Musculoskeletal:        General: Normal range of motion.     Cervical back: Normal range of motion and neck supple.  Skin:    General: Skin is warm.     Comments: Right leg is wrapped as per dressed and request of wound care  Neurological:     General: No focal deficit present.     Mental Status: She is alert and oriented to person, place, and time.  Psychiatric:        Attention and Perception: Attention normal.        Mood and Affect: Mood normal.        Speech: Speech normal.        Behavior: Behavior normal. Behavior is cooperative.        Thought Content: Thought content normal.        Cognition and Memory: Cognition normal.        Judgment: Judgment normal.     Assessment   1. Type 2 diabetes mellitus with diabetic neuropathy, without long-term current use of insulin ([place])   2. Morbid obesity ([place])   3. Hyperlipidemia associated with type 2 diabetes mellitus ([place])   4. Venous stasis ulcer of right calf limited to breakdown of skin, unspecified whether varicose veins present ([place])   5. Vitamin D deficiency   6. Yeast dermatitis     Tests ordered Orders Placed This Encounter  Procedures  . CBC  . Comprehensive metabolic panel  . Hemoglobin A1c  . Lipid  panel  . VITAMIN D 25 Hydroxy (Vit-D Deficiency, Fractures)  . CBC  . COMPLETE METABOLIC PANEL WITH GFR  . Hemoglobin A1c  . Lipid panel  . VITAMIN D 25 Hydroxy (Vit-D Deficiency, Fractures)     Plan: Please see assessment and  plan per problem list above.   Meds ordered this encounter  Medications  . nystatin (MYCOSTATIN/NYSTOP) powder    Sig: Apply 1 application topically 2 (two) times daily.    Dispense:  15 g    Refill:  3    Order Specific Question:   Supervising Provider    Answer:   Jacklynn Bue    Patient to follow-up in Feb .  Perlie Mayo, NP

## 2020-03-18 NOTE — Assessment & Plan Note (Signed)
Currently diet is encouraged.  Ordered labs for February continue current medication.

## 2020-03-18 NOTE — Assessment & Plan Note (Addendum)
Close follow-up with wound care now. Overall doing really well with this and healing. Still refuses to wear compression socks.

## 2020-03-18 NOTE — Assessment & Plan Note (Signed)
Nystatin powder provided.

## 2020-03-18 NOTE — Patient Instructions (Addendum)
I appreciate the opportunity to provide you with care for your health and wellness. Today we discussed:   Follow up: Feb for annual visit -fasting labs before appt same day   Labs-fasting same day of next appt right before hand No referrals today Orders: [place]- please call us back with the number  Nystatin sent in. Use as directed. Please eat more regularly :)  Please continue to practice social distancing to keep you, your family, and our community safe.  If you must go out, please wear a mask and practice good handwashing.  It was a pleasure to see you and I look forward to continuing to work together on your health and well-being. Please do not hesitate to call the office if you need care or have questions about your care.  Have a wonderful day and week. With Gratitude, Tereasa Coop, DNP, AGNP-BC

## 2020-03-18 NOTE — Assessment & Plan Note (Signed)
Slight improvement, obesity linked to hyperlipidemia, type 2 diabetes  I have encouraged her to eat more consistently given that she is a diabetic and on medications.  If she continues to lose weight I have asked her to recheck Before next appointment.  Wt Readings from Last 3 Encounters:  03/18/20 222 lb 1.9 oz (100.8 kg)  03/17/20 223 lb (101.2 kg)  03/10/20 228 lb (103.4 kg)

## 2020-03-24 ENCOUNTER — Other Ambulatory Visit: Payer: Self-pay

## 2020-03-24 ENCOUNTER — Encounter: Payer: Self-pay | Admitting: Cardiothoracic Surgery

## 2020-03-24 ENCOUNTER — Ambulatory Visit (INDEPENDENT_AMBULATORY_CARE_PROVIDER_SITE_OTHER): Payer: Medicare HMO | Admitting: Cardiothoracic Surgery

## 2020-03-24 VITALS — BP 145/82 | HR 94 | Temp 98.4°F | Resp 19 | Wt 224.0 lb

## 2020-03-24 DIAGNOSIS — I83012 Varicose veins of right lower extremity with ulcer of calf: Secondary | ICD-10-CM | POA: Diagnosis not present

## 2020-03-24 DIAGNOSIS — L97211 Non-pressure chronic ulcer of right calf limited to breakdown of skin: Secondary | ICD-10-CM | POA: Diagnosis not present

## 2020-03-24 NOTE — Progress Notes (Signed)
Follow-up Wound Care  Arterial Studies from 03/04/2020: Right ABI:1.21 Left ABI: 1.23  Right medial lower leg (suspected venous ulcer, full thickness) Measurements: 0.7x1.5x0.1, smaller in length and width. Wound Bed Description:100% pink/red, small amount of serosanguinous drainage.  Treatment: Cleansed wound with anacept and gauze,cleansed leg with antibacterial soap and water, applied hydrofera blue-ready, TCA cream to periwound, unna boot with kerlix/coban to leg.   Leg Circumference: Calf=45.5cm and Ankle=35.5cm   Follow-up appointment in one week  She returns today in follow-up.  She has no new complaints.  She states that her wound was not painful but pruritic.  The wound measurements are outlined above.  The wound bed is granulation tissue with minimal serous drainage.  There is no surrounding erythema.  There is no purulent discharge.  We will continue the [facility].  We will also apply an Radio broadcast assistant.  She will come back to see [place] again in 1 week.

## 2020-03-31 ENCOUNTER — Encounter: Payer: Self-pay | Admitting: Cardiothoracic Surgery

## 2020-03-31 ENCOUNTER — Other Ambulatory Visit: Payer: Self-pay

## 2020-03-31 ENCOUNTER — Ambulatory Visit (INDEPENDENT_AMBULATORY_CARE_PROVIDER_SITE_OTHER): Payer: Medicare HMO | Admitting: Cardiothoracic Surgery

## 2020-03-31 VITALS — BP 129/76 | HR 94 | Temp 98.3°F | Resp 19

## 2020-03-31 DIAGNOSIS — I83012 Varicose veins of right lower extremity with ulcer of calf: Secondary | ICD-10-CM

## 2020-03-31 DIAGNOSIS — L97211 Non-pressure chronic ulcer of right calf limited to breakdown of skin: Secondary | ICD-10-CM

## 2020-03-31 NOTE — Progress Notes (Signed)
Follow-up Wound Care   Right medial lower leg (suspected venous ulcer, full thickness) Measurements:0.4x0.7x0.1, smaller in length and width. Wound Bed Description:100% pink/red, small amount of serosanguinous drainage.  Treatment: Cleansed wound with anacept and gauze,cleansed leg with antibacterial soap and water, applied hydrofera blue-ready, unna boot with kerlix/coban to leg.   Leg Circumference: Calf=45.5cm and Ankle=35.5cm   Follow-up appointment in one week  She returns today in follow-up.  She has no new complaints.  We did discuss with her the long-term management of her psoriasis as well as her lower extremity edema.  She is not interested in any compression garments.  She is not interested in any juxta light external garments as well.  The wound measurements are outlined as above.  There is an excellent granulating base.  There is no purulent discharge.  There is no erythema.  Although there are no palpable pulses in her feet her foot is warm and well-perfused.  We will continue the [facility] as well as the [employer].  She will come back to see [place] again in 1 week.

## 2020-04-07 ENCOUNTER — Encounter: Payer: Self-pay | Admitting: Cardiothoracic Surgery

## 2020-04-07 ENCOUNTER — Ambulatory Visit (INDEPENDENT_AMBULATORY_CARE_PROVIDER_SITE_OTHER): Payer: Medicare HMO | Admitting: Cardiothoracic Surgery

## 2020-04-07 ENCOUNTER — Other Ambulatory Visit: Payer: Self-pay

## 2020-04-07 VITALS — BP 130/85 | HR 90 | Temp 98.0°F | Resp 16 | Ht 65.5 in | Wt 223.0 lb

## 2020-04-07 DIAGNOSIS — L97211 Non-pressure chronic ulcer of right calf limited to breakdown of skin: Secondary | ICD-10-CM

## 2020-04-07 DIAGNOSIS — I83012 Varicose veins of right lower extremity with ulcer of calf: Secondary | ICD-10-CM | POA: Diagnosis not present

## 2020-04-07 NOTE — Progress Notes (Signed)
Follow-up Wound Care   Right medial lower leg is healed/100% epithelialized. Patient discharged from wound care services. Educated patient on importance of wearing compression stockings daily, she stated full understanding. Patient can call if wound re-opens or new wound develops.   She returns today in follow-up.  She has no new complaints.  Her wounds are now completely healed.  There is no open areas.  We again discussed the possibility of compression garments or external compression devices.  She is not interested in that today.  She knows that she can call us anytime if she has any further wounds develop.  All of her questions were answered.

## 2020-05-05 ENCOUNTER — Other Ambulatory Visit: Payer: Self-pay | Admitting: Family Medicine

## 2020-05-05 DIAGNOSIS — E1169 Type 2 diabetes mellitus with other specified complication: Secondary | ICD-10-CM

## 2020-05-12 ENCOUNTER — Other Ambulatory Visit: Payer: Self-pay | Admitting: Family Medicine

## 2020-05-12 DIAGNOSIS — E1149 Type 2 diabetes mellitus with other diabetic neurological complication: Secondary | ICD-10-CM

## 2020-05-12 DIAGNOSIS — E1140 Type 2 diabetes mellitus with diabetic neuropathy, unspecified: Secondary | ICD-10-CM

## 2020-05-25 ENCOUNTER — Ambulatory Visit: Payer: Medicare HMO | Admitting: Family Medicine

## 2020-05-27 ENCOUNTER — Encounter: Payer: Self-pay | Admitting: Family Medicine

## 2020-05-27 ENCOUNTER — Other Ambulatory Visit: Payer: Self-pay

## 2020-05-27 ENCOUNTER — Ambulatory Visit (INDEPENDENT_AMBULATORY_CARE_PROVIDER_SITE_OTHER): Payer: Medicare HMO | Admitting: Family Medicine

## 2020-05-27 VITALS — BP 132/78 | HR 92 | Temp 97.7°F | Ht 65.5 in | Wt 219.0 lb

## 2020-05-27 DIAGNOSIS — H1032 Unspecified acute conjunctivitis, left eye: Secondary | ICD-10-CM | POA: Insufficient documentation

## 2020-05-27 DIAGNOSIS — B351 Tinea unguium: Secondary | ICD-10-CM | POA: Insufficient documentation

## 2020-05-27 DIAGNOSIS — M2550 Pain in unspecified joint: Secondary | ICD-10-CM | POA: Diagnosis not present

## 2020-05-27 MED ORDER — POLYMYXIN B-TRIMETHOPRIM 10000-0.1 UNIT/ML-% OP SOLN: 1.0000 [drp] | OPHTHALMIC | 0 refills | Status: DC

## 2020-05-27 MED ORDER — IBU 800 MG PO TABS: 800.0000 mg | ORAL_TABLET | Freq: Three times a day (TID) | ORAL | 1 refills | Status: DC | PRN

## 2020-05-27 NOTE — Patient Instructions (Signed)
°  HAPPY FALL!  I appreciate the opportunity to provide you with care for your health and wellness. Today we discussed: foot care needs, eye care needs, and joint pain  Follow up: 09/03/2020 as scheduled  No labs  Referrals today: foot dr  Please consider seeing the eye dr due to blurred vision  I have sent in drops, but you would benefit from having pressure checked  Please continue to practice social distancing to keep you, your family, and our community safe.  If you must go out, please wear a mask and practice good handwashing.  It was a pleasure to see you and I look forward to continuing to work together on your health and well-being. Please do not hesitate to call the office if you need care or have questions about your care.  Have a wonderful day and week. With Gratitude, Tereasa Coop, DNP, AGNP-BC

## 2020-05-27 NOTE — Progress Notes (Signed)
Subjective:  Patient ID: Madison Ho, female    DOB: [birth year]  Age: 69 y.o. MRN: [id number]  CC:  Chief Complaint  Patient presents with  . Follow-up    med refills-ibuprofen      HPI  HPI Madison Ho is a 69 year old female patient of mine. She presents today for med refill for ibuprofen secondary to on going joint pains generalized across several joints. She takes this daily without issue. Kidney function is stable. Denies trouble with GI upset or bleeding. She does not take a PPI, though I have recommended it today. I would like her to consider a different medication in future, she is willing to discuss at next visits.   Today patient denies signs and symptoms of COVID 19 infection including fever, chills, cough, shortness of breath, and headache. Past Medical, Surgical, Social History, Allergies, and Medications have been Reviewed.   Past Medical History:  Diagnosis Date  . Diabetes mellitus without complication ([place])   . Encounter for hepatitis C screening test for low risk patient 08/30/2019  . Encounter for imaging to assess osteoporosis 08/30/2019  . Encounter for screening mammogram for malignant neoplasm of breast 08/30/2019  . Hyperlipidemia   . Hypertension   . Type 2 diabetes mellitus with neurological complications ([place]) 6/38/1771    Current Meds  Medication Sig  . Blood Glucose Monitoring Suppl (ACCU-CHEK AVIVA PLUS) w/Device KIT   . FARXIGA 5 MG TABS tablet Take 1 tablet (5 mg total) by mouth daily.  . fluticasone (FLONASE) 50 MCG/ACT nasal spray Place 1 spray into both nostrils 2 (two) times daily as needed for allergies or rhinitis.  Marland Kitchen gabapentin (NEURONTIN) 300 MG capsule Take 1 capsule (300 mg total) by mouth 3 (three) times daily.  Marland Kitchen glipiZIDE (GLUCOTROL XL) 5 MG 24 hr tablet Take 1 tablet (5 mg total) by mouth daily after breakfast.  . IBU 800 MG tablet Take 1 tablet (800 mg total) by mouth every 8 (eight) hours as needed for moderate pain.  .  metFORMIN (GLUCOPHAGE) 500 MG tablet Take 1 tablet (500 mg total) by mouth with breakfast, with lunch, and with evening meal.  . nystatin (MYCOSTATIN/NYSTOP) powder Apply 1 application topically 2 (two) times daily.  . pravastatin (PRAVACHOL) 40 MG tablet Take 1 tablet (40 mg total) by mouth daily.  Marland Kitchen UNABLE TO FIND Accu-Chek Softclix Lacet Kit  Use as directed to check blood sugar once daily  . UNABLE TO FIND Accu-Chek Aviva Solution   Use as directed  . [DISCONTINUED] IBU 800 MG tablet Take 800 mg by mouth 3 (three) times daily.    ROS:  Review of Systems  HENT: Negative.   Eyes: Negative.   Respiratory: Negative.   Cardiovascular: Negative.   Gastrointestinal: Negative.   Genitourinary: Negative.   Musculoskeletal: Positive for joint pain.  Skin: Negative.   Neurological: Negative.   Endo/Heme/Allergies: Negative.   Psychiatric/Behavioral: Negative.      Objective:   Today's Vitals: BP 132/78 (BP Location: Left Arm, Patient Position: Sitting, Cuff Size: Large)   Pulse 92   Temp 97.7 F (36.5 C) (Tympanic)   Ht 5' 5.5" (1.664 m)   Wt 219 lb (99.3 kg)   SpO2 96%   BMI 35.89 kg/m  Vitals with BMI 05/27/2020 04/07/2020 03/31/2020  Height 5' 5.5" 5' 5.5" -  Weight 219 lbs 223 lbs -  BMI 16.57 90.38 -  Systolic 333 832 919  Diastolic 78 85 76  Pulse 92 90 94  Physical Exam Vitals and nursing note reviewed.  Constitutional:      Appearance: Normal appearance. She is well-developed and well-groomed. She is obese.  HENT:     Head: Normocephalic and atraumatic.     Right Ear: External ear normal.     Left Ear: External ear normal.  Eyes:     General: Lids are normal.        Right eye: No discharge.        Left eye: No discharge.     Extraocular Movements: Extraocular movements intact.     Conjunctiva/sclera:     Left eye: Left conjunctiva is injected. No exudate or hemorrhage. Cardiovascular:     Rate and Rhythm: Normal rate and regular rhythm.     Pulses: Normal  pulses.     Heart sounds: Normal heart sounds.  Pulmonary:     Effort: Pulmonary effort is normal.     Breath sounds: Normal breath sounds.  Musculoskeletal:        General: Normal range of motion.     Cervical back: Normal range of motion and neck supple.  Feet:     Right foot:     Skin integrity: Skin breakdown, callus and dry skin present.     Toenail Condition: Right toenails are abnormally thick and long.     Left foot:     Skin integrity: Skin breakdown, callus and dry skin present.     Toenail Condition: Left toenails are abnormally thick and long.  Skin:    General: Skin is warm.  Neurological:     General: No focal deficit present.     Mental Status: She is alert and oriented to person, place, and time.  Psychiatric:        Attention and Perception: Attention normal.        Mood and Affect: Mood normal.        Speech: Speech normal.        Behavior: Behavior normal. Behavior is cooperative.        Thought Content: Thought content normal.        Cognition and Memory: Cognition normal.        Judgment: Judgment normal.      Assessment   1. Generalized joint pain   2. Nail fungus   3. Acute conjunctivitis of left eye, unspecified acute conjunctivitis type     Tests ordered Orders Placed This Encounter  Procedures  . Ambulatory referral to Podiatry     Plan: Please see assessment and plan per problem list above.   Meds ordered this encounter  Medications  . IBU 800 MG tablet    Sig: Take 1 tablet (800 mg total) by mouth every 8 (eight) hours as needed for moderate pain.    Dispense:  30 tablet    Refill:  1    Order Specific Question:   Supervising Provider    Answer:   SIMPSON, MARGARET E [[id number]]  . trimethoprim-polymyxin b (POLYTRIM) ophthalmic solution    Sig: Place 1 drop into the left eye every 4 (four) hours.    Dispense:  10 mL    Refill:  0    Order Specific Question:   Supervising Provider    Answer:   Jacklynn Bue     Patient to follow-up in 09/03/2020   Note: This dictation was prepared with Dragon dictation along with smaller phrase technology. Similar sounding words can be transcribed inadequately or may not be corrected upon review. Any transcriptional errors  that result from this process are unintentional.      Perlie Mayo, NP

## 2020-05-27 NOTE — Assessment & Plan Note (Signed)
Refill today of IBU Encouraged the use of PPI.  Would like to see her possibly reduce the use of this medication or the risk that it can cause to the GI tract.  She states that she is willing to talk about this at future appointments

## 2020-05-27 NOTE — Assessment & Plan Note (Signed)
Referral to podiatry for nail trim, fungus tx and overall skin assessment for care

## 2020-05-27 NOTE — Assessment & Plan Note (Signed)
She did not notice or report trouble with her eye today.  On PE it was noted to be red and watery, at that time she was question about it and she reported some crust and grit like sensation.  Tx provided  I also advised eye dr follow up for pressure check

## 2020-07-05 ENCOUNTER — Other Ambulatory Visit: Payer: Self-pay | Admitting: Family Medicine

## 2020-08-29 ENCOUNTER — Other Ambulatory Visit: Payer: Self-pay | Admitting: Family Medicine

## 2020-09-03 ENCOUNTER — Encounter: Payer: Medicare HMO | Admitting: Family Medicine

## 2020-11-09 NOTE — Progress Notes (Signed)
Subjective:   Madison Ho is a 70 y.o. female who presents for Medicare Annual (Subsequent) preventive examination.  I connected with Cathryn Gallery  today by telephone and verified that I am speaking with the correct person using two identifiers. Location patient: home Location provider: work Persons participating in the virtual visit: patient, provider.   I discussed the limitations, risks, security and privacy concerns of performing an evaluation and management service by telephone and the availability of in person appointments. I also discussed with the patient that there may be a patient responsible charge related to this service. The patient expressed understanding and verbally consented to this telephonic visit.    Interactive audio and video telecommunications were attempted between this provider and patient, however failed, due to patient having technical difficulties OR patient did not have access to video capability.  We continued and completed visit with audio only.      Review of Systems    N/A  Cardiac Risk Factors include: diabetes mellitus;advanced age (>28mn, >>45women);hypertension;obesity (BMI >30kg/m2)     Objective:    Today's Vitals   11/10/20 1601  PainSc: 3    There is no height or weight on file to calculate BMI.  Advanced Directives 11/10/2020  Does Patient Have a Medical Advance Directive? No  Would patient like information on creating a medical advance directive? No - Patient declined    Current Medications (verified) Outpatient Encounter Medications as of 11/10/2020  Medication Sig  . ACCU-CHEK AVIVA PLUS test strip TEST BLOOD SUGAR EVERY DAY  . Accu-Chek Softclix Lancets lancets TEST BLOOD SUGAR EVERY DAY  . Alcohol Swabs (B-D SINGLE USE SWABS REGULAR) PADS USE AS DIRECTED EVERY DAY  . Blood Glucose Calibration (ACCU-CHEK AVIVA) SOLN USE AS DIRECTED WITH GLUCOMETER  . Blood Glucose Monitoring Suppl (ACCU-CHEK AVIVA PLUS) w/Device KIT   .  fluticasone (FLONASE) 50 MCG/ACT nasal spray USE 1 SPRAY INTO BOTH NOSTRILS 2 TIMES DAILY AS NEEDED FOR ALLERGIES OR RHINITIS  . gabapentin (NEURONTIN) 300 MG capsule TAKE 1 CAPSULE THREE TIMES DAILY  . glipiZIDE (GLUCOTROL XL) 5 MG 24 hr tablet TAKE 1 TABLET EVERY DAY AFTER BREAKFAST  . IBU 800 MG tablet Take 1 tablet (800 mg total) by mouth every 8 (eight) hours as needed for moderate pain.  . Lancets Misc. (ACCU-CHEK SOFTCLIX LANCET DEV) KIT USE AS DIRECTED  TO CHECK BLOOD SUGAR ONE TIME DAILY  . metFORMIN (GLUCOPHAGE) 500 MG tablet TAKE 1 TABLET  WITH BREAKFAST, WITH LUNCH, AND WITH EVENING MEAL.  . pravastatin (PRAVACHOL) 40 MG tablet TAKE 1 TABLET EVERY DAY  . UNABLE TO FIND Accu-Chek Softclix Lacet Kit  Use as directed to check blood sugar once daily  . UNABLE TO FIND Accu-Chek Aviva Solution   Use as directed  . FARXIGA 5 MG TABS tablet Take 1 tablet (5 mg total) by mouth daily. (Patient not taking: Reported on 11/10/2020)  . nystatin (MYCOSTATIN/NYSTOP) powder Apply 1 application topically 2 (two) times daily. (Patient not taking: Reported on 11/10/2020)  . [DISCONTINUED] trimethoprim-polymyxin b (POLYTRIM) ophthalmic solution Place 1 drop into the left eye every 4 (four) hours.   No facility-administered encounter medications on file as of 11/10/2020.    Allergies (verified) Penicillins   History: Past Medical History:  Diagnosis Date  . Diabetes mellitus without complication ([place]   . Encounter for hepatitis C screening test for low risk patient 08/30/2019  . Encounter for imaging to assess osteoporosis 08/30/2019  . Encounter for screening mammogram for malignant neoplasm  of breast 08/30/2019  . Hyperlipidemia   . Hypertension   . Type 2 diabetes mellitus with neurological complications ([place]) 6/62/9476   Past Surgical History:  Procedure Laterality Date  . EYE SURGERY N/A    [employer] 11/10/2020  . LEG SURGERY    . TUBAL LIGATION     Family History  Problem Relation Age of  Onset  . Stroke Mother   . Heart attack Mother   . Heart attack Father    Social History   Socioeconomic History  . Marital status: Widowed    Spouse name: Not on file  . Number of children: 2  . Years of education: Not on file  . Highest education level: Bachelor's degree (e.g., BA, AB, BS)  Occupational History  . Not on file  Tobacco Use  . Smoking status: Current Every Day Smoker  . Smokeless tobacco: Never Used  Substance and Sexual Activity  . Alcohol use: Yes    Comment: occas  . Drug use: Never  . Sexual activity: Not Currently  Other Topics Concern  . Not on file  Social History Narrative   Lives with Lynelle Smoke recently moved from [place] in nov 2020   Grandson and her son       Cats      Enjoy: crochet, play merge dragons phone apps game       Diet: loves seafood, eggs, veggies, chicken and sides, limited fast food and red meat   Caffeine: 2 pots of coffee daily, drink a lot of sweet tea, (root beer)   Water: 3 cups daily at least       Wears seat belt   Smoke and carbon monoxide detectors    Does not drive   Social Determinants of Health   Financial Resource Strain: Low Risk   . Difficulty of Paying Living Expenses: Not hard at all  Food Insecurity: No Food Insecurity  . Worried About Charity fundraiser in the Last Year: Never true  . Ran Out of Food in the Last Year: Never true  Transportation Needs: No Transportation Needs  . Lack of Transportation (Medical): No  . Lack of Transportation (Non-Medical): No  Physical Activity: Inactive  . Days of Exercise per Week: 0 days  . Minutes of Exercise per Session: 0 min  Stress: No Stress Concern Present  . Feeling of Stress : Not at all  Social Connections: Moderately Isolated  . Frequency of Communication with Friends and Family: More than three times a week  . Frequency of Social Gatherings with Friends and Family: More than three times a week  . Attends Religious Services: 1 to 4 times per year  . Active  Member of Clubs or Organizations: No  . Attends Archivist Meetings: Never  . Marital Status: Widowed    Tobacco Counseling Ready to quit: Not Answered Counseling given: Not Answered   Clinical Intake:  Pre-visit preparation completed: Yes  Pain : 0-10 Pain Score: 3  Pain Type: Chronic pain Pain Location: Back (knees,elbows) Pain Descriptors / Indicators: Aching Pain Onset: More than a month ago Pain Frequency: Constant Pain Relieving Factors: ibuprofen  Pain Relieving Factors: ibuprofen  Nutritional Risks: None Diabetes: Yes CBG done?: No Did pt. bring in CBG monitor from home?: No  How often do you need to have someone help you when you read instructions, pamphlets, or other written materials from your doctor or pharmacy?: 1 - Never  Diabetic?Yes Nutrition Risk Assessment:  Has the patient had any N/V/D  within the last 2 months?  No  Does the patient have any non-healing wounds?  No  Has the patient had any unintentional weight loss or weight gain?  No   Diabetes:  Is the patient diabetic?  Yes  If diabetic, was a CBG obtained today?  No  Did the patient bring in their glucometer from home?  No  How often do you monitor your CBG's? States checks glucose once or twice a day.   Financial Strains and Diabetes Management:  Are you having any financial strains with the device, your supplies or your medication? No .  Does the patient want to be seen by Chronic Care Management for management of their diabetes?  No  Would the patient like to be referred to a Nutritionist or for Diabetic Management?  No   Diabetic Exams:  Diabetic Eye Exam: Completed 02/16/2020 Diabetic Foot Exam: Overdue, Pt has been advised about the importance in completing this exam. Pt is scheduled for diabetic foot exam on next scheduled office visit.   Interpreter Needed?: No  Information entered by :: [place] of Daily Living In your present state of health, do  you have any difficulty performing the following activities: 11/10/2020  Hearing? N  Vision? N  Difficulty concentrating or making decisions? N  Walking or climbing stairs? N  Dressing or bathing? N  Doing errands, shopping? Y  Comment Patient no longer drives  Preparing Food and eating ? N  Using the Toilet? N  In the past six months, have you accidently leaked urine? Y  Do you have problems with loss of bowel control? N  Managing your Medications? N  Managing your Finances? N  Housekeeping or managing your Housekeeping? N  Some recent data might be hidden    Patient Care Team: Perlie Mayo, NP as PCP - General (Family Medicine)  Indicate any recent Medical Services you may have received from other than [facility] providers in the past year (date may be approximate).     Assessment:   This is a routine wellness examination for Madison Ho.  Hearing/Vision screen  Hearing Screening   '125Hz'  '250Hz'  '500Hz'  '1000Hz'  '2000Hz'  '3000Hz'  '4000Hz'  '6000Hz'  '8000Hz'   Right ear:           Left ear:           Vision Screening Comments: Patient states has not had an ophthalmology exam in years.  Wears reading glasses. Has hx of cataract surgery  Dietary issues and exercise activities discussed: Current Exercise Habits: The patient does not participate in regular exercise at present  Goals    . DIET - EAT MORE FRUITS AND VEGETABLES    . DIET - INCREASE WATER INTAKE    . LIFESTYLE - DECREASE FALLS RISK      Depression Screen PHQ 2/9 Scores 11/10/2020 05/27/2020 03/18/2020 01/22/2020 12/17/2019 11/10/2019 08/28/2019  PHQ - 2 Score 0 0 0 0 0 0 0  PHQ- 9 Score - - - - 0 - -    Fall Risk Fall Risk  11/10/2020 05/27/2020 03/31/2020 03/24/2020 03/18/2020  Falls in the past year? 0 0 0 0 1  Number falls in past yr: 0 0 - - 1  Injury with Fall? 0 0 - - 0  Risk for fall due to : No Fall Risks No Fall Risks - - -  Follow up Falls evaluation completed;Falls prevention discussed Falls evaluation completed - - -    FALL  RISK PREVENTION PERTAINING TO THE HOME:  Any  stairs in or around the home? Yes  If so, are there any without handrails? No  Home free of loose throw rugs in walkways, pet beds, electrical cords, etc? Yes  Adequate lighting in your home to reduce risk of falls? Yes   ASSISTIVE DEVICES UTILIZED TO PREVENT FALLS:  Life alert? No  Use of a cane, walker or w/c? No  Grab bars in the bathroom? No  Shower chair or bench in shower? No  Elevated toilet seat or a handicapped toilet? No     Cognitive Function:   Normal cognitive status assessed by direct observation by this Nurse Health Advisor. No abnormalities found.     6CIT Screen 11/10/2019  What Year? 0 points  What month? 0 points  What time? 0 points  Count back from 20 0 points  Months in reverse 2 points  Repeat phrase 0 points  Total Score 2    Immunizations  There is no immunization history on file for this patient.  TDAP status: Due, Education has been provided regarding the importance of this vaccine. Advised may receive this vaccine at local pharmacy or Health Dept. Aware to provide a copy of the vaccination record if obtained from local pharmacy or Health Dept. Verbalized acceptance and understanding.  Flu Vaccine status: Due, Education has been provided regarding the importance of this vaccine. Advised may receive this vaccine at local pharmacy or Health Dept. Aware to provide a copy of the vaccination record if obtained from local pharmacy or Health Dept. Verbalized acceptance and understanding.  Pneumococcal vaccine status: Due, Education has been provided regarding the importance of this vaccine. Advised may receive this vaccine at local pharmacy or Health Dept. Aware to provide a copy of the vaccination record if obtained from local pharmacy or Health Dept. Verbalized acceptance and understanding.  Covid-19 vaccine status: Declined, Education has been provided regarding the importance of this vaccine but patient still  declined. Advised may receive this vaccine at local pharmacy or Health Dept.or vaccine clinic. Aware to provide a copy of the vaccination record if obtained from local pharmacy or Health Dept. Verbalized acceptance and understanding.  Qualifies for Shingles Vaccine? Yes   Zostavax completed No   Shingrix Completed?: No.    Education has been provided regarding the importance of this vaccine. Patient has been advised to call insurance company to determine out of pocket expense if they have not yet received this vaccine. Advised may also receive vaccine at local pharmacy or Health Dept. Verbalized acceptance and understanding.  Screening Tests Health Maintenance  Topic Date Due  . Hepatitis C Screening  Never done  . FOOT EXAM  Never done  . URINE MICROALBUMIN  Never done  . TETANUS/TDAP  Never done  . COLONOSCOPY (Pts 45-69yr Insurance coverage will need to be confirmed)  Never done  . DEXA SCAN  Never done  . PNA vac Low Risk Adult (1 of 2 - PCV13) Never done  . HEMOGLOBIN A1C  03/04/2020  . MAMMOGRAM  12/14/2020  . OPHTHALMOLOGY EXAM  02/15/2021  . INFLUENZA VACCINE  02/28/2021  . HPV VACCINES  Aged Out  . COVID-19 Vaccine  Discontinued    Health Maintenance  Health Maintenance Due  Topic Date Due  . Hepatitis C Screening  Never done  . FOOT EXAM  Never done  . URINE MICROALBUMIN  Never done  . TETANUS/TDAP  Never done  . COLONOSCOPY (Pts 45-496yrInsurance coverage will need to be confirmed)  Never done  . DEXA SCAN  Never done  . PNA vac Low Risk Adult (1 of 2 - PCV13) Never done  . HEMOGLOBIN A1C  03/04/2020    Colorectal Cancer Screening: Patient declined   Mammogram status: Completed 12/15/2019. Repeat every year  Bone Density status: Ordered 11/10/2020. Pt provided with contact info and advised to call to schedule appt.  Lung Cancer Screening: (Low Dose CT Chest recommended if Age 59-80 years, 30 pack-year currently smoking OR have quit w/in 15years.) does qualify.    Lung Cancer Screening Referral: No  Additional Screening:  Hepatitis C Screening: does qualify;   Vision Screening: Recommended annual ophthalmology exams for early detection of glaucoma and other disorders of the eye. Is the patient up to date with their annual eye exam?  No  Who is the provider or what is the name of the office in which the patient attends annual eye exams? Does not have an eye doctor  If pt is not established with a provider, would they like to be referred to a provider to establish care? No .   Dental Screening: Recommended annual dental exams for proper oral hygiene  Community Resource Referral / Chronic Care Management: CRR required this visit?  No   CCM required this visit?  No      Plan:     I have personally reviewed and noted the following in the patient's chart:   . Medical and social history . Use of alcohol, tobacco or illicit drugs  . Current medications and supplements . Functional ability and status . Nutritional status . Physical activity . Advanced directives . List of other physicians . Hospitalizations, surgeries, and ER visits in previous 12 months . Vitals . Screenings to include cognitive, depression, and falls . Referrals and appointments  In addition, I have reviewed and discussed with patient certain preventive protocols, quality metrics, and best practice recommendations. A written personalized care plan for preventive services as well as general preventive health recommendations were provided to patient.     Ofilia Neas, LPN   [id number]   Nurse Notes: None

## 2020-11-10 ENCOUNTER — Telehealth (INDEPENDENT_AMBULATORY_CARE_PROVIDER_SITE_OTHER): Payer: Medicare HMO

## 2020-11-10 DIAGNOSIS — Z0000 Encounter for general adult medical examination without abnormal findings: Secondary | ICD-10-CM

## 2020-11-10 DIAGNOSIS — Z780 Asymptomatic menopausal state: Secondary | ICD-10-CM

## 2020-11-10 NOTE — Patient Instructions (Signed)
Madison Ho , Thank you for taking time to come for your Medicare Wellness Visit. I appreciate your ongoing commitment to your health goals. Please review the following plan we discussed and let me know if I can assist you in the future.   Screening recommendations/referrals: Colonoscopy: Patient declined  Mammogram: Up to date next due 12/14/2020 Bone Density: Currently due, orders placed this visit Recommended yearly ophthalmology/optometry visit for glaucoma screening and checkup Recommended yearly dental visit for hygiene and checkup  Vaccinations: Influenza vaccine: Patient declined Pneumococcal vaccine: Patient declined  Tdap vaccine: Patient declined  Shingles vaccine: Patient declined     Advanced directives: Advance directive discussed with you today. Even though you declined this today please call our office should you change your mind and we can give you the proper paperwork for you to fill out.   Conditions/risks identified: None   Next appointment: None    Preventive Care 65 Years and Older, Female Preventive care refers to lifestyle choices and visits with your health care provider that can promote health and wellness. What does preventive care include?  A yearly physical exam. This is also called an annual well check.  Dental exams once or twice a year.  Routine eye exams. Ask your health care provider how often you should have your eyes checked.  Personal lifestyle choices, including:  Daily care of your teeth and gums.  Regular physical activity.  Eating a healthy diet.  Avoiding tobacco and drug use.  Limiting alcohol use.  Practicing safe sex.  Taking low-dose aspirin every day.  Taking vitamin and mineral supplements as recommended by your health care provider. What happens during an annual well check? The services and screenings done by your health care provider during your annual well check will depend on your age, overall health, lifestyle  risk factors, and family history of disease. Counseling  Your health care provider may ask you questions about your:  Alcohol use.  Tobacco use.  Drug use.  Emotional well-being.  Home and relationship well-being.  Sexual activity.  Eating habits.  History of falls.  Memory and ability to understand (cognition).  Work and work Astronomer.  Reproductive health. Screening  You may have the following tests or measurements:  Height, weight, and BMI.  Blood pressure.  Lipid and cholesterol levels. These may be checked every 5 years, or more frequently if you are over 85 years old.  Skin check.  Lung cancer screening. You may have this screening every year starting at age 81 if you have a 30-pack-year history of smoking and currently smoke or have quit within the past 15 years.  Fecal occult blood test (FOBT) of the stool. You may have this test every year starting at age 66.  Flexible sigmoidoscopy or colonoscopy. You may have a sigmoidoscopy every 5 years or a colonoscopy every 10 years starting at age 71.  Hepatitis C blood test.  Hepatitis B blood test.  Sexually transmitted disease (STD) testing.  Diabetes screening. This is done by checking your blood sugar (glucose) after you have not eaten for a while (fasting). You may have this done every 1-3 years.  Bone density scan. This is done to screen for osteoporosis. You may have this done starting at age 31.  Mammogram. This may be done every 1-2 years. Talk to your health care provider about how often you should have regular mammograms. Talk with your health care provider about your test results, treatment options, and if necessary, the need for more tests.  Vaccines  Your health care provider may recommend certain vaccines, such as:  Influenza vaccine. This is recommended every year.  Tetanus, diphtheria, and acellular pertussis (Tdap, Td) vaccine. You may need a Td booster every 10 years.  Zoster vaccine.  You may need this after age 22.  Pneumococcal 13-valent conjugate (PCV13) vaccine. One dose is recommended after age 3.  Pneumococcal polysaccharide (PPSV23) vaccine. One dose is recommended after age 80. Talk to your health care provider about which screenings and vaccines you need and how often you need them. This information is not intended to replace advice given to you by your health care provider. Make sure you discuss any questions you have with your health care provider. Document Released: 08/13/2015 Document Revised: 04/05/2016 Document Reviewed: 05/18/2015 Elsevier Interactive Patient Education  2017 [place] Prevention in the Home Falls can cause injuries. They can happen to people of all ages. There are many things you can do to make your home safe and to help prevent falls. What can I do on the outside of my home?  Regularly fix the edges of walkways and driveways and fix any cracks.  Remove anything that might make you trip as you walk through a door, such as a raised step or threshold.  Trim any bushes or trees on the path to your home.  Use bright outdoor lighting.  Clear any walking paths of anything that might make someone trip, such as rocks or tools.  Regularly check to see if handrails are loose or broken. Make sure that both sides of any steps have handrails.  Any raised decks and porches should have guardrails on the edges.  Have any leaves, snow, or ice cleared regularly.  Use sand or salt on walking paths during winter.  Clean up any spills in your garage right away. This includes oil or grease spills. What can I do in the bathroom?  Use night lights.  Install grab bars by the toilet and in the tub and shower. Do not use towel bars as grab bars.  Use non-skid mats or decals in the tub or shower.  If you need to sit down in the shower, use a plastic, non-slip stool.  Keep the floor dry. Clean up any water that spills on the floor as soon  as it happens.  Remove soap buildup in the tub or shower regularly.  Attach bath mats securely with double-sided non-slip rug tape.  Do not have throw rugs and other things on the floor that can make you trip. What can I do in the bedroom?  Use night lights.  Make sure that you have a light by your bed that is easy to reach.  Do not use any sheets or blankets that are too big for your bed. They should not hang down onto the floor.  Have a firm chair that has side arms. You can use this for support while you get dressed.  Do not have throw rugs and other things on the floor that can make you trip. What can I do in the kitchen?  Clean up any spills right away.  Avoid walking on wet floors.  Keep items that you use a lot in easy-to-reach places.  If you need to reach something above you, use a strong step stool that has a grab bar.  Keep electrical cords out of the way.  Do not use floor polish or wax that makes floors slippery. If you must use wax, use non-skid floor wax.  Do not have throw rugs and other things on the floor that can make you trip. What can I do with my stairs?  Do not leave any items on the stairs.  Make sure that there are handrails on both sides of the stairs and use them. Fix handrails that are broken or loose. Make sure that handrails are as long as the stairways.  Check any carpeting to make sure that it is firmly attached to the stairs. Fix any carpet that is loose or worn.  Avoid having throw rugs at the top or bottom of the stairs. If you do have throw rugs, attach them to the floor with carpet tape.  Make sure that you have a light switch at the top of the stairs and the bottom of the stairs. If you do not have them, ask someone to add them for you. What else can I do to help prevent falls?  Wear shoes that:  Do not have high heels.  Have rubber bottoms.  Are comfortable and fit you well.  Are closed at the toe. Do not wear sandals.  If  you use a stepladder:  Make sure that it is fully opened. Do not climb a closed stepladder.  Make sure that both sides of the stepladder are locked into place.  Ask someone to hold it for you, if possible.  Clearly mark and make sure that you can see:  Any grab bars or handrails.  First and last steps.  Where the edge of each step is.  Use tools that help you move around (mobility aids) if they are needed. These include:  Canes.  Walkers.  Scooters.  Crutches.  Turn on the lights when you go into a dark area. Replace any light bulbs as soon as they burn out.  Set up your furniture so you have a clear path. Avoid moving your furniture around.  If any of your floors are uneven, fix them.  If there are any pets around you, be aware of where they are.  Review your medicines with your doctor. Some medicines can make you feel dizzy. This can increase your chance of falling. Ask your doctor what other things that you can do to help prevent falls. This information is not intended to replace advice given to you by your health care provider. Make sure you discuss any questions you have with your health care provider. Document Released: 05/13/2009 Document Revised: 12/23/2015 Document Reviewed: 08/21/2014 Elsevier Interactive Patient Education  2017 [employer].

## 2020-12-14 DIAGNOSIS — E1159 Type 2 diabetes mellitus with other circulatory complications: Secondary | ICD-10-CM | POA: Diagnosis not present

## 2020-12-14 DIAGNOSIS — F17210 Nicotine dependence, cigarettes, uncomplicated: Secondary | ICD-10-CM | POA: Diagnosis not present

## 2020-12-14 DIAGNOSIS — I872 Venous insufficiency (chronic) (peripheral): Secondary | ICD-10-CM | POA: Diagnosis not present

## 2020-12-14 DIAGNOSIS — L4050 Arthropathic psoriasis, unspecified: Secondary | ICD-10-CM | POA: Diagnosis not present

## 2020-12-14 DIAGNOSIS — M7989 Other specified soft tissue disorders: Secondary | ICD-10-CM | POA: Diagnosis not present

## 2020-12-14 DIAGNOSIS — N289 Disorder of kidney and ureter, unspecified: Secondary | ICD-10-CM | POA: Diagnosis not present

## 2020-12-14 DIAGNOSIS — J449 Chronic obstructive pulmonary disease, unspecified: Secondary | ICD-10-CM | POA: Diagnosis not present

## 2020-12-14 DIAGNOSIS — Z20822 Contact with and (suspected) exposure to covid-19: Secondary | ICD-10-CM | POA: Diagnosis not present

## 2020-12-14 DIAGNOSIS — M1711 Unilateral primary osteoarthritis, right knee: Secondary | ICD-10-CM | POA: Diagnosis not present

## 2020-12-14 DIAGNOSIS — L03115 Cellulitis of right lower limb: Secondary | ICD-10-CM | POA: Diagnosis not present

## 2020-12-14 DIAGNOSIS — S81801A Unspecified open wound, right lower leg, initial encounter: Secondary | ICD-10-CM | POA: Diagnosis not present

## 2020-12-14 DIAGNOSIS — I890 Lymphedema, not elsewhere classified: Secondary | ICD-10-CM | POA: Diagnosis not present

## 2020-12-15 DIAGNOSIS — M40204 Unspecified kyphosis, thoracic region: Secondary | ICD-10-CM | POA: Diagnosis not present

## 2020-12-15 DIAGNOSIS — M7989 Other specified soft tissue disorders: Secondary | ICD-10-CM | POA: Diagnosis not present

## 2020-12-15 DIAGNOSIS — Z9119 Patient's noncompliance with other medical treatment and regimen: Secondary | ICD-10-CM | POA: Diagnosis not present

## 2020-12-15 DIAGNOSIS — L4050 Arthropathic psoriasis, unspecified: Secondary | ICD-10-CM | POA: Diagnosis not present

## 2020-12-15 DIAGNOSIS — D72829 Elevated white blood cell count, unspecified: Secondary | ICD-10-CM | POA: Diagnosis not present

## 2020-12-15 DIAGNOSIS — S81801A Unspecified open wound, right lower leg, initial encounter: Secondary | ICD-10-CM | POA: Diagnosis not present

## 2020-12-15 DIAGNOSIS — M1711 Unilateral primary osteoarthritis, right knee: Secondary | ICD-10-CM | POA: Diagnosis not present

## 2020-12-15 DIAGNOSIS — L03115 Cellulitis of right lower limb: Secondary | ICD-10-CM | POA: Diagnosis not present

## 2020-12-15 DIAGNOSIS — M2578 Osteophyte, vertebrae: Secondary | ICD-10-CM | POA: Diagnosis not present

## 2020-12-15 DIAGNOSIS — S299XXA Unspecified injury of thorax, initial encounter: Secondary | ICD-10-CM | POA: Diagnosis not present

## 2020-12-15 DIAGNOSIS — M47814 Spondylosis without myelopathy or radiculopathy, thoracic region: Secondary | ICD-10-CM | POA: Diagnosis not present

## 2020-12-16 DIAGNOSIS — E119 Type 2 diabetes mellitus without complications: Secondary | ICD-10-CM | POA: Diagnosis not present

## 2020-12-16 DIAGNOSIS — L4050 Arthropathic psoriasis, unspecified: Secondary | ICD-10-CM | POA: Diagnosis not present

## 2020-12-16 DIAGNOSIS — R197 Diarrhea, unspecified: Secondary | ICD-10-CM | POA: Diagnosis not present

## 2020-12-16 DIAGNOSIS — L03115 Cellulitis of right lower limb: Secondary | ICD-10-CM | POA: Diagnosis not present

## 2020-12-16 DIAGNOSIS — I890 Lymphedema, not elsewhere classified: Secondary | ICD-10-CM | POA: Diagnosis not present

## 2020-12-16 DIAGNOSIS — R0602 Shortness of breath: Secondary | ICD-10-CM | POA: Diagnosis not present

## 2020-12-16 DIAGNOSIS — N289 Disorder of kidney and ureter, unspecified: Secondary | ICD-10-CM | POA: Diagnosis not present

## 2020-12-16 DIAGNOSIS — D72829 Elevated white blood cell count, unspecified: Secondary | ICD-10-CM | POA: Diagnosis not present

## 2020-12-16 DIAGNOSIS — Z792 Long term (current) use of antibiotics: Secondary | ICD-10-CM | POA: Diagnosis not present

## 2020-12-17 DIAGNOSIS — J449 Chronic obstructive pulmonary disease, unspecified: Secondary | ICD-10-CM | POA: Diagnosis not present

## 2020-12-17 DIAGNOSIS — Z9981 Dependence on supplemental oxygen: Secondary | ICD-10-CM | POA: Diagnosis not present

## 2020-12-17 DIAGNOSIS — E119 Type 2 diabetes mellitus without complications: Secondary | ICD-10-CM | POA: Diagnosis not present

## 2020-12-17 DIAGNOSIS — B958 Unspecified staphylococcus as the cause of diseases classified elsewhere: Secondary | ICD-10-CM | POA: Diagnosis not present

## 2020-12-17 DIAGNOSIS — L03115 Cellulitis of right lower limb: Secondary | ICD-10-CM | POA: Diagnosis not present

## 2020-12-17 DIAGNOSIS — D72829 Elevated white blood cell count, unspecified: Secondary | ICD-10-CM | POA: Diagnosis not present

## 2020-12-17 DIAGNOSIS — I890 Lymphedema, not elsewhere classified: Secondary | ICD-10-CM | POA: Diagnosis not present

## 2020-12-17 DIAGNOSIS — L4050 Arthropathic psoriasis, unspecified: Secondary | ICD-10-CM | POA: Diagnosis not present

## 2020-12-17 DIAGNOSIS — F17200 Nicotine dependence, unspecified, uncomplicated: Secondary | ICD-10-CM | POA: Diagnosis not present

## 2020-12-17 DIAGNOSIS — N289 Disorder of kidney and ureter, unspecified: Secondary | ICD-10-CM | POA: Diagnosis not present

## 2020-12-20 ENCOUNTER — Telehealth: Payer: Self-pay

## 2020-12-20 NOTE — Telephone Encounter (Signed)
Pt refused to schedule TOC - States she is switching to [facility]

## 2020-12-31 DIAGNOSIS — R6889 Other general symptoms and signs: Secondary | ICD-10-CM | POA: Diagnosis not present

## 2020-12-31 DIAGNOSIS — L4050 Arthropathic psoriasis, unspecified: Secondary | ICD-10-CM | POA: Diagnosis not present

## 2020-12-31 DIAGNOSIS — F17210 Nicotine dependence, cigarettes, uncomplicated: Secondary | ICD-10-CM | POA: Diagnosis not present

## 2020-12-31 DIAGNOSIS — Z299 Encounter for prophylactic measures, unspecified: Secondary | ICD-10-CM | POA: Diagnosis not present

## 2020-12-31 DIAGNOSIS — Z6841 Body Mass Index (BMI) 40.0 and over, adult: Secondary | ICD-10-CM | POA: Diagnosis not present

## 2020-12-31 DIAGNOSIS — E7800 Pure hypercholesterolemia, unspecified: Secondary | ICD-10-CM | POA: Diagnosis not present

## 2020-12-31 DIAGNOSIS — E1165 Type 2 diabetes mellitus with hyperglycemia: Secondary | ICD-10-CM | POA: Diagnosis not present

## 2021-01-12 IMAGING — MG DIGITAL SCREENING BILAT W/ TOMO W/ CAD
8 series · 8 of 24 positions shown · non-contrast
Comparison: None.

CLINICAL DATA: Screening.

EXAM:
DIGITAL SCREENING BILATERAL MAMMOGRAM WITH TOMO AND CAD

[R CC synth-2D]
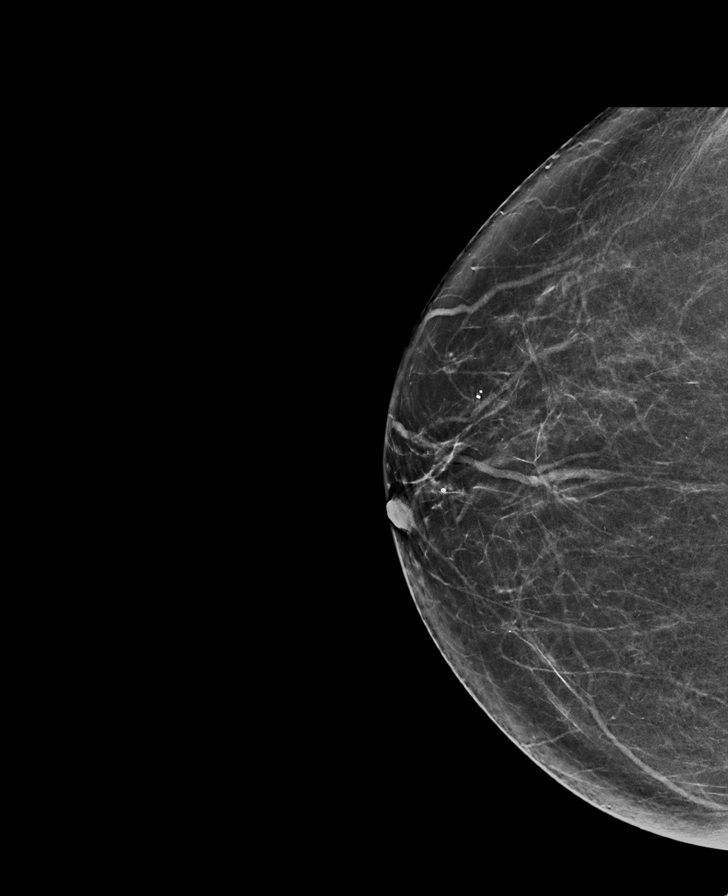

[L MLO synth-2D]
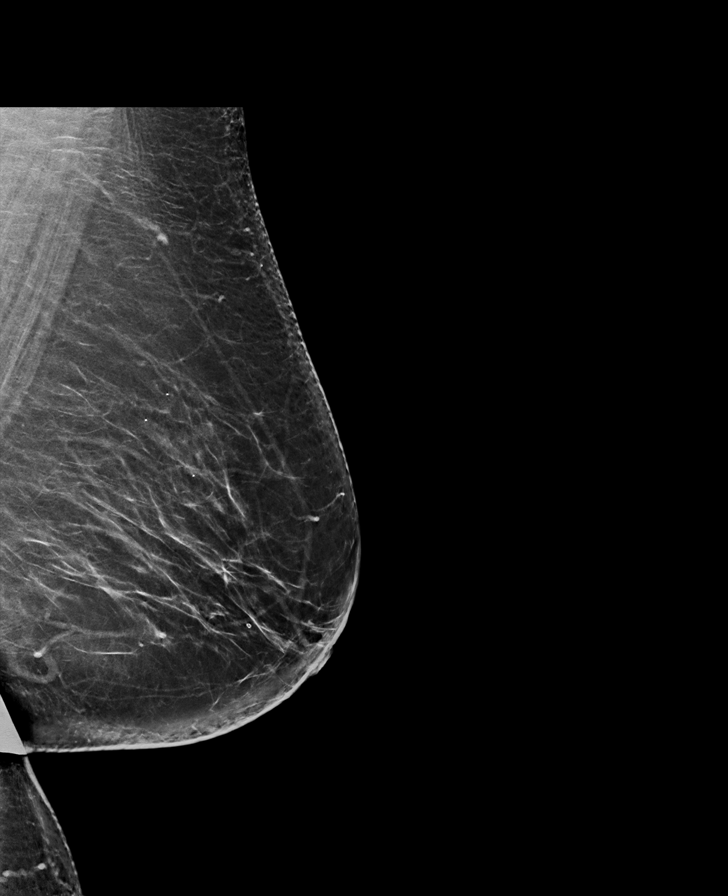

[R MLO synth-2D]
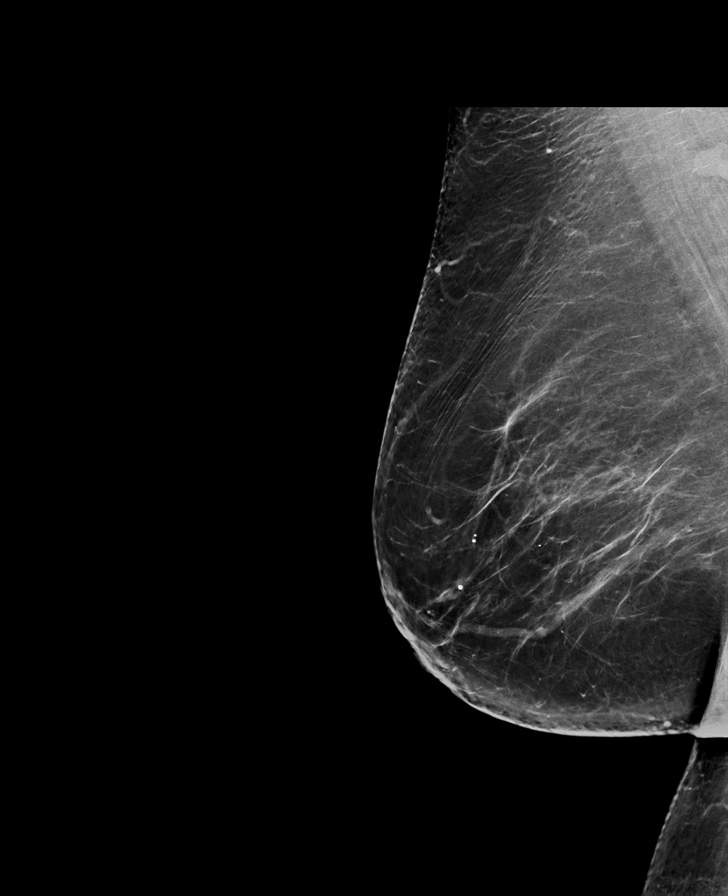

[L CC synth-2D]
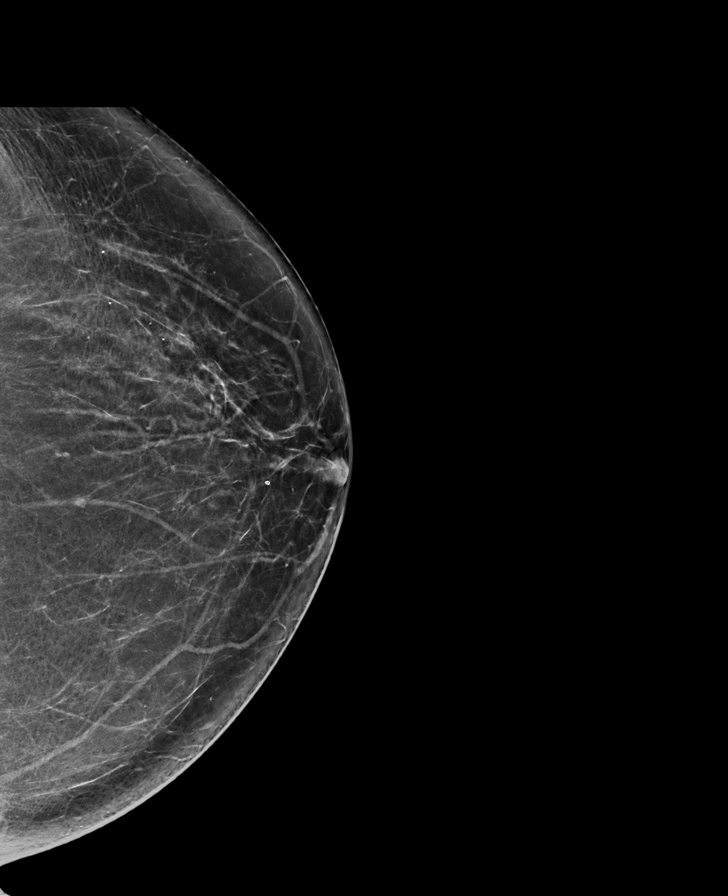

[L MLO tomo · tomo slice 41/80.0]
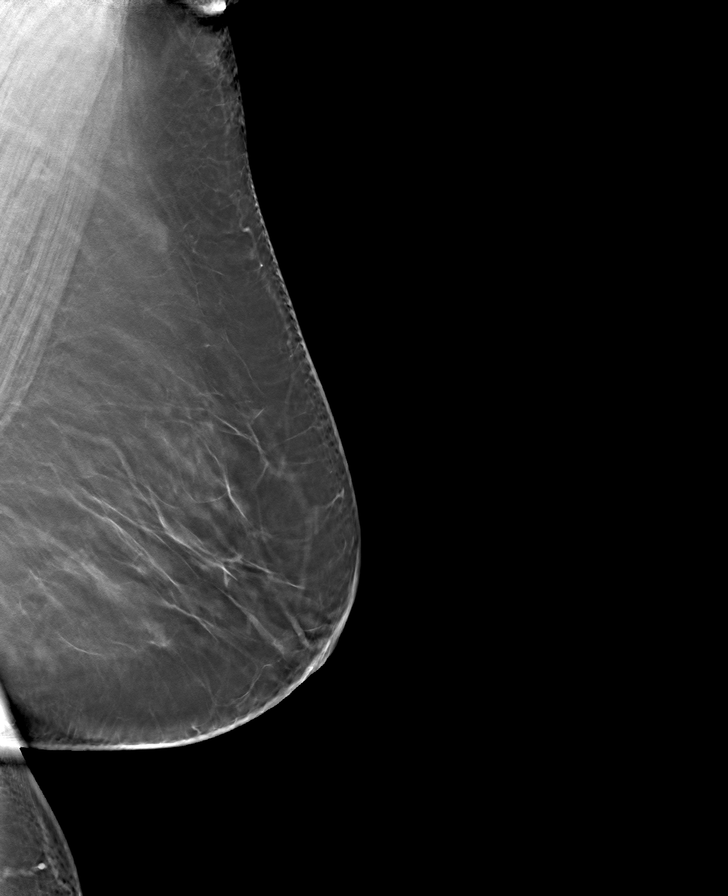

[L CC tomo · tomo slice 35/68.0]
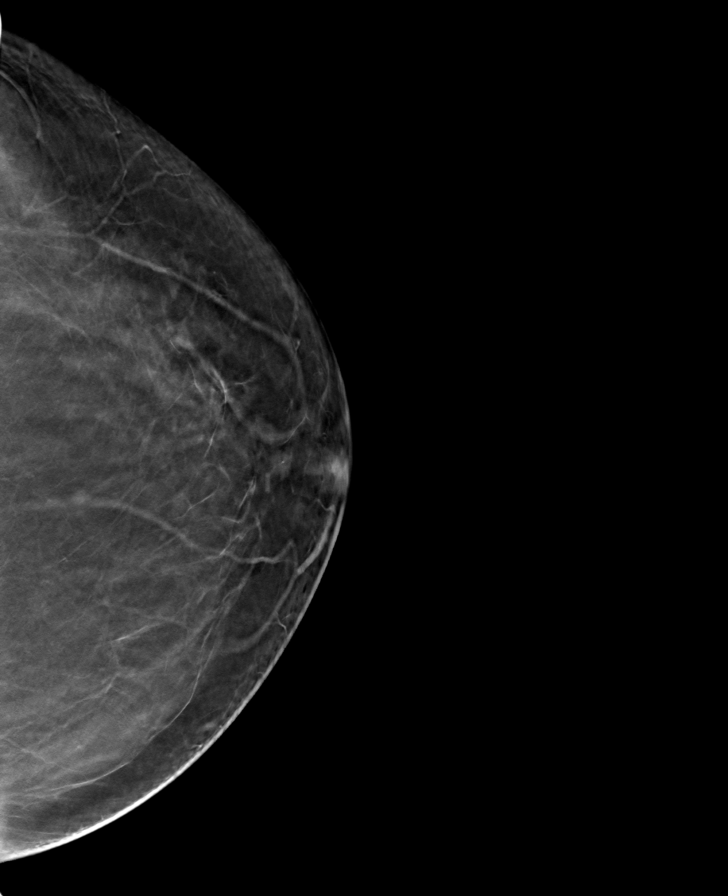

[R MLO tomo · tomo slice 44/87.0]
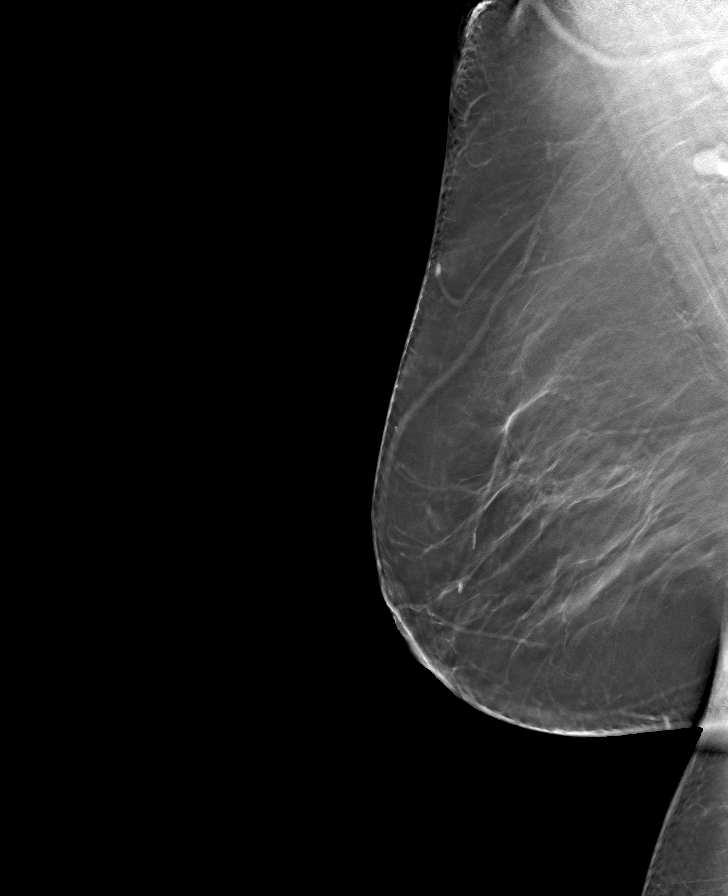

[R CC tomo · tomo slice 33/66.0]
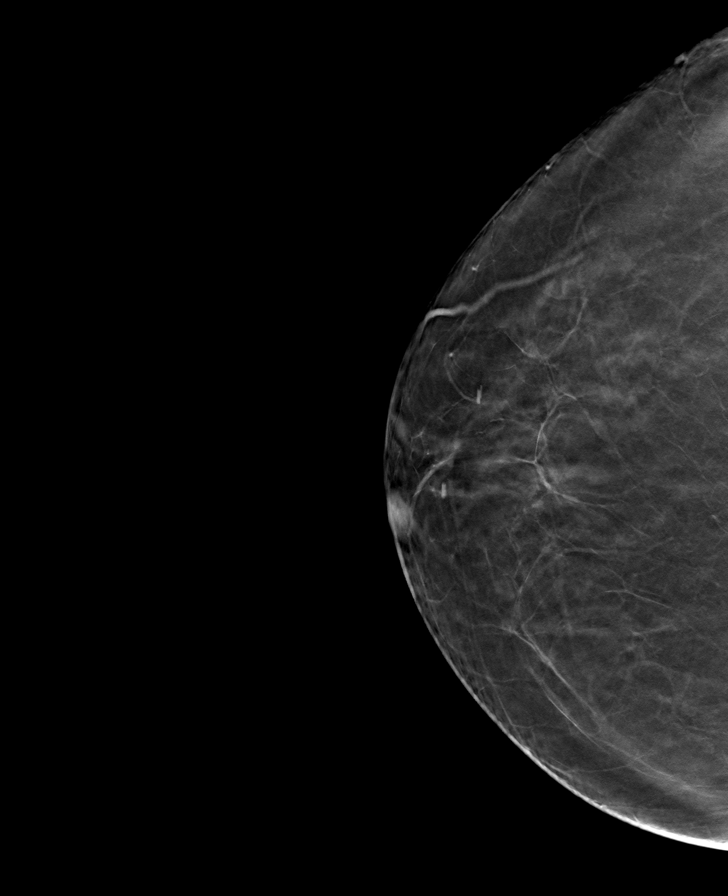

[8 of 24 positions shown; findings below may reference images not displayed]

ACR Breast Density Category b: There are scattered areas of
fibroglandular density.
FINDINGS: There are no findings suspicious for malignancy. Images were
processed with CAD.
IMPRESSION: No mammographic evidence of malignancy. A result letter of this
screening mammogram will be mailed directly to the patient.

RECOMMENDATION:
Screening mammogram in one year. (Code:Y5-G-EJ6)

BI-RADS CATEGORY  1: Negative.

## 2021-01-17 DIAGNOSIS — J449 Chronic obstructive pulmonary disease, unspecified: Secondary | ICD-10-CM | POA: Diagnosis not present

## 2021-01-23 ENCOUNTER — Other Ambulatory Visit: Payer: Self-pay | Admitting: Family Medicine

## 2021-02-16 DIAGNOSIS — J449 Chronic obstructive pulmonary disease, unspecified: Secondary | ICD-10-CM | POA: Diagnosis not present

## 2021-03-16 DIAGNOSIS — R6889 Other general symptoms and signs: Secondary | ICD-10-CM | POA: Diagnosis not present

## 2021-03-16 DIAGNOSIS — I872 Venous insufficiency (chronic) (peripheral): Secondary | ICD-10-CM | POA: Diagnosis not present

## 2021-03-19 DIAGNOSIS — J449 Chronic obstructive pulmonary disease, unspecified: Secondary | ICD-10-CM | POA: Diagnosis not present

## 2021-03-25 DIAGNOSIS — E1165 Type 2 diabetes mellitus with hyperglycemia: Secondary | ICD-10-CM | POA: Diagnosis not present

## 2021-03-25 DIAGNOSIS — F17210 Nicotine dependence, cigarettes, uncomplicated: Secondary | ICD-10-CM | POA: Diagnosis not present

## 2021-03-25 DIAGNOSIS — R6889 Other general symptoms and signs: Secondary | ICD-10-CM | POA: Diagnosis not present

## 2021-03-25 DIAGNOSIS — L4050 Arthropathic psoriasis, unspecified: Secondary | ICD-10-CM | POA: Diagnosis not present

## 2021-03-25 DIAGNOSIS — Z299 Encounter for prophylactic measures, unspecified: Secondary | ICD-10-CM | POA: Diagnosis not present

## 2021-04-07 DIAGNOSIS — L74519 Primary focal hyperhidrosis, unspecified: Secondary | ICD-10-CM | POA: Diagnosis not present

## 2021-04-07 DIAGNOSIS — I872 Venous insufficiency (chronic) (peripheral): Secondary | ICD-10-CM | POA: Diagnosis not present

## 2021-04-07 DIAGNOSIS — R6889 Other general symptoms and signs: Secondary | ICD-10-CM | POA: Diagnosis not present

## 2021-07-07 DIAGNOSIS — Z299 Encounter for prophylactic measures, unspecified: Secondary | ICD-10-CM | POA: Diagnosis not present

## 2021-07-07 DIAGNOSIS — Z6841 Body Mass Index (BMI) 40.0 and over, adult: Secondary | ICD-10-CM | POA: Diagnosis not present

## 2021-07-07 DIAGNOSIS — E1165 Type 2 diabetes mellitus with hyperglycemia: Secondary | ICD-10-CM | POA: Diagnosis not present

## 2021-07-07 DIAGNOSIS — M1990 Unspecified osteoarthritis, unspecified site: Secondary | ICD-10-CM | POA: Diagnosis not present

## 2021-08-24 NOTE — Congregational Nurse Program (Signed)
No complaints or concerns. BP 140/84 HR 82. Stated she felt fine today. Jenene Slicker RN

## 2021-10-10 DIAGNOSIS — Z299 Encounter for prophylactic measures, unspecified: Secondary | ICD-10-CM | POA: Diagnosis not present

## 2021-10-10 DIAGNOSIS — F17210 Nicotine dependence, cigarettes, uncomplicated: Secondary | ICD-10-CM | POA: Diagnosis not present

## 2021-10-10 DIAGNOSIS — E1129 Type 2 diabetes mellitus with other diabetic kidney complication: Secondary | ICD-10-CM | POA: Diagnosis not present

## 2021-10-10 DIAGNOSIS — E1165 Type 2 diabetes mellitus with hyperglycemia: Secondary | ICD-10-CM | POA: Diagnosis not present

## 2021-10-10 DIAGNOSIS — L4050 Arthropathic psoriasis, unspecified: Secondary | ICD-10-CM | POA: Diagnosis not present

## 2021-11-15 DIAGNOSIS — Z1331 Encounter for screening for depression: Secondary | ICD-10-CM | POA: Diagnosis not present

## 2021-11-15 DIAGNOSIS — Z0000 Encounter for general adult medical examination without abnormal findings: Secondary | ICD-10-CM | POA: Diagnosis not present

## 2021-11-15 DIAGNOSIS — F17210 Nicotine dependence, cigarettes, uncomplicated: Secondary | ICD-10-CM | POA: Diagnosis not present

## 2021-11-15 DIAGNOSIS — Z1211 Encounter for screening for malignant neoplasm of colon: Secondary | ICD-10-CM | POA: Diagnosis not present

## 2021-11-15 DIAGNOSIS — Z6841 Body Mass Index (BMI) 40.0 and over, adult: Secondary | ICD-10-CM | POA: Diagnosis not present

## 2021-11-15 DIAGNOSIS — Z1339 Encounter for screening examination for other mental health and behavioral disorders: Secondary | ICD-10-CM | POA: Diagnosis not present

## 2021-11-15 DIAGNOSIS — Z7189 Other specified counseling: Secondary | ICD-10-CM | POA: Diagnosis not present

## 2021-11-15 DIAGNOSIS — Z299 Encounter for prophylactic measures, unspecified: Secondary | ICD-10-CM | POA: Diagnosis not present

## 2022-01-17 DIAGNOSIS — E1165 Type 2 diabetes mellitus with hyperglycemia: Secondary | ICD-10-CM | POA: Diagnosis not present

## 2022-01-17 DIAGNOSIS — F17210 Nicotine dependence, cigarettes, uncomplicated: Secondary | ICD-10-CM | POA: Diagnosis not present

## 2022-01-17 DIAGNOSIS — Z299 Encounter for prophylactic measures, unspecified: Secondary | ICD-10-CM | POA: Diagnosis not present

## 2022-01-17 DIAGNOSIS — M1990 Unspecified osteoarthritis, unspecified site: Secondary | ICD-10-CM | POA: Diagnosis not present

## 2022-01-20 DIAGNOSIS — E2839 Other primary ovarian failure: Secondary | ICD-10-CM | POA: Diagnosis not present

## 2022-01-30 ENCOUNTER — Other Ambulatory Visit: Payer: Self-pay

## 2022-01-30 MED ORDER — ACCU-CHEK SOFTCLIX LANCETS MISC: 0 refills | Status: DC

## 2022-01-30 MED ORDER — FLUTICASONE PROPIONATE 50 MCG/ACT NA SUSP: NASAL | 1 refills | Status: DC

## 2022-03-26 DIAGNOSIS — S91201A Unspecified open wound of right great toe with damage to nail, initial encounter: Secondary | ICD-10-CM | POA: Diagnosis not present

## 2022-03-26 DIAGNOSIS — M79671 Pain in right foot: Secondary | ICD-10-CM | POA: Diagnosis not present

## 2022-03-26 DIAGNOSIS — M25571 Pain in right ankle and joints of right foot: Secondary | ICD-10-CM | POA: Diagnosis not present

## 2022-03-26 DIAGNOSIS — S91211A Laceration without foreign body of right great toe with damage to nail, initial encounter: Secondary | ICD-10-CM | POA: Diagnosis not present

## 2022-03-26 DIAGNOSIS — F17210 Nicotine dependence, cigarettes, uncomplicated: Secondary | ICD-10-CM | POA: Diagnosis not present

## 2022-03-26 DIAGNOSIS — F17220 Nicotine dependence, chewing tobacco, uncomplicated: Secondary | ICD-10-CM | POA: Diagnosis not present

## 2022-03-26 DIAGNOSIS — W010XXA Fall on same level from slipping, tripping and stumbling without subsequent striking against object, initial encounter: Secondary | ICD-10-CM | POA: Diagnosis not present

## 2022-03-26 DIAGNOSIS — Z880 Allergy status to penicillin: Secondary | ICD-10-CM | POA: Diagnosis not present

## 2022-04-05 DIAGNOSIS — F17210 Nicotine dependence, cigarettes, uncomplicated: Secondary | ICD-10-CM | POA: Diagnosis not present

## 2022-04-05 DIAGNOSIS — E1165 Type 2 diabetes mellitus with hyperglycemia: Secondary | ICD-10-CM | POA: Diagnosis not present

## 2022-04-05 DIAGNOSIS — L4050 Arthropathic psoriasis, unspecified: Secondary | ICD-10-CM | POA: Diagnosis not present

## 2022-04-05 DIAGNOSIS — Z299 Encounter for prophylactic measures, unspecified: Secondary | ICD-10-CM | POA: Diagnosis not present

## 2022-04-05 DIAGNOSIS — L409 Psoriasis, unspecified: Secondary | ICD-10-CM | POA: Diagnosis not present

## 2022-04-18 DIAGNOSIS — Z1212 Encounter for screening for malignant neoplasm of rectum: Secondary | ICD-10-CM | POA: Diagnosis not present

## 2022-04-18 DIAGNOSIS — Z1211 Encounter for screening for malignant neoplasm of colon: Secondary | ICD-10-CM | POA: Diagnosis not present

## 2022-06-30 DIAGNOSIS — F17210 Nicotine dependence, cigarettes, uncomplicated: Secondary | ICD-10-CM | POA: Diagnosis not present

## 2022-06-30 DIAGNOSIS — Z0000 Encounter for general adult medical examination without abnormal findings: Secondary | ICD-10-CM | POA: Diagnosis not present

## 2022-06-30 DIAGNOSIS — E1165 Type 2 diabetes mellitus with hyperglycemia: Secondary | ICD-10-CM | POA: Diagnosis not present

## 2022-06-30 DIAGNOSIS — E7800 Pure hypercholesterolemia, unspecified: Secondary | ICD-10-CM | POA: Diagnosis not present

## 2022-06-30 DIAGNOSIS — Z79899 Other long term (current) drug therapy: Secondary | ICD-10-CM | POA: Diagnosis not present

## 2022-06-30 DIAGNOSIS — Z6841 Body Mass Index (BMI) 40.0 and over, adult: Secondary | ICD-10-CM | POA: Diagnosis not present

## 2022-06-30 DIAGNOSIS — Z299 Encounter for prophylactic measures, unspecified: Secondary | ICD-10-CM | POA: Diagnosis not present

## 2022-07-28 DIAGNOSIS — Z299 Encounter for prophylactic measures, unspecified: Secondary | ICD-10-CM | POA: Diagnosis not present

## 2022-07-28 DIAGNOSIS — E1165 Type 2 diabetes mellitus with hyperglycemia: Secondary | ICD-10-CM | POA: Diagnosis not present

## 2022-07-28 DIAGNOSIS — L4050 Arthropathic psoriasis, unspecified: Secondary | ICD-10-CM | POA: Diagnosis not present

## 2022-07-28 DIAGNOSIS — Z713 Dietary counseling and surveillance: Secondary | ICD-10-CM | POA: Diagnosis not present

## 2022-07-30 DIAGNOSIS — E1165 Type 2 diabetes mellitus with hyperglycemia: Secondary | ICD-10-CM | POA: Diagnosis not present

## 2022-09-28 DIAGNOSIS — E785 Hyperlipidemia, unspecified: Secondary | ICD-10-CM | POA: Diagnosis not present

## 2022-09-28 DIAGNOSIS — E1165 Type 2 diabetes mellitus with hyperglycemia: Secondary | ICD-10-CM | POA: Diagnosis not present

## 2022-11-02 DIAGNOSIS — F339 Major depressive disorder, recurrent, unspecified: Secondary | ICD-10-CM | POA: Diagnosis not present

## 2022-11-02 DIAGNOSIS — Z299 Encounter for prophylactic measures, unspecified: Secondary | ICD-10-CM | POA: Diagnosis not present

## 2022-11-02 DIAGNOSIS — E1165 Type 2 diabetes mellitus with hyperglycemia: Secondary | ICD-10-CM | POA: Diagnosis not present

## 2022-11-02 DIAGNOSIS — L4050 Arthropathic psoriasis, unspecified: Secondary | ICD-10-CM | POA: Diagnosis not present

## 2022-11-02 DIAGNOSIS — F17210 Nicotine dependence, cigarettes, uncomplicated: Secondary | ICD-10-CM | POA: Diagnosis not present

## 2022-11-24 DIAGNOSIS — Z7189 Other specified counseling: Secondary | ICD-10-CM | POA: Diagnosis not present

## 2022-11-24 DIAGNOSIS — Z299 Encounter for prophylactic measures, unspecified: Secondary | ICD-10-CM | POA: Diagnosis not present

## 2022-11-24 DIAGNOSIS — F17210 Nicotine dependence, cigarettes, uncomplicated: Secondary | ICD-10-CM | POA: Diagnosis not present

## 2022-11-24 DIAGNOSIS — Z0000 Encounter for general adult medical examination without abnormal findings: Secondary | ICD-10-CM | POA: Diagnosis not present

## 2022-11-24 DIAGNOSIS — R5383 Other fatigue: Secondary | ICD-10-CM | POA: Diagnosis not present

## 2022-11-24 DIAGNOSIS — Z6838 Body mass index (BMI) 38.0-38.9, adult: Secondary | ICD-10-CM | POA: Diagnosis not present

## 2022-11-24 DIAGNOSIS — E7800 Pure hypercholesterolemia, unspecified: Secondary | ICD-10-CM | POA: Diagnosis not present

## 2022-11-24 DIAGNOSIS — F339 Major depressive disorder, recurrent, unspecified: Secondary | ICD-10-CM | POA: Diagnosis not present

## 2022-11-24 DIAGNOSIS — Z1339 Encounter for screening examination for other mental health and behavioral disorders: Secondary | ICD-10-CM | POA: Diagnosis not present

## 2022-11-24 DIAGNOSIS — Z1331 Encounter for screening for depression: Secondary | ICD-10-CM | POA: Diagnosis not present

## 2022-11-24 DIAGNOSIS — Z79899 Other long term (current) drug therapy: Secondary | ICD-10-CM | POA: Diagnosis not present

## 2022-11-24 DIAGNOSIS — E559 Vitamin D deficiency, unspecified: Secondary | ICD-10-CM | POA: Diagnosis not present

## 2023-01-11 DIAGNOSIS — B351 Tinea unguium: Secondary | ICD-10-CM | POA: Diagnosis not present

## 2023-01-11 DIAGNOSIS — E1142 Type 2 diabetes mellitus with diabetic polyneuropathy: Secondary | ICD-10-CM | POA: Diagnosis not present

## 2023-01-11 DIAGNOSIS — M79676 Pain in unspecified toe(s): Secondary | ICD-10-CM | POA: Diagnosis not present

## 2023-02-13 DIAGNOSIS — H26493 Other secondary cataract, bilateral: Secondary | ICD-10-CM | POA: Diagnosis not present

## 2023-02-13 DIAGNOSIS — H401132 Primary open-angle glaucoma, bilateral, moderate stage: Secondary | ICD-10-CM | POA: Diagnosis not present

## 2023-02-23 DIAGNOSIS — H26493 Other secondary cataract, bilateral: Secondary | ICD-10-CM | POA: Diagnosis not present

## 2023-03-01 DIAGNOSIS — F17210 Nicotine dependence, cigarettes, uncomplicated: Secondary | ICD-10-CM | POA: Diagnosis not present

## 2023-03-01 DIAGNOSIS — Z299 Encounter for prophylactic measures, unspecified: Secondary | ICD-10-CM | POA: Diagnosis not present

## 2023-03-01 DIAGNOSIS — F339 Major depressive disorder, recurrent, unspecified: Secondary | ICD-10-CM | POA: Diagnosis not present

## 2023-03-01 DIAGNOSIS — E1165 Type 2 diabetes mellitus with hyperglycemia: Secondary | ICD-10-CM | POA: Diagnosis not present

## 2023-03-01 DIAGNOSIS — L4050 Arthropathic psoriasis, unspecified: Secondary | ICD-10-CM | POA: Diagnosis not present

## 2023-03-01 DIAGNOSIS — Z0000 Encounter for general adult medical examination without abnormal findings: Secondary | ICD-10-CM | POA: Diagnosis not present

## 2023-03-09 ENCOUNTER — Emergency Department (HOSPITAL_COMMUNITY)
Admission: EM | Admit: 2023-03-09 | Discharge: 2023-03-09 | Disposition: A | Discharge status: Home / Self Care | Payer: Medicare PPO | Attending: Student | Admitting: Student

## 2023-03-09 ENCOUNTER — Emergency Department (HOSPITAL_COMMUNITY): Payer: Medicare PPO

## 2023-03-09 ENCOUNTER — Encounter (HOSPITAL_COMMUNITY): Payer: Self-pay | Admitting: *Deleted

## 2023-03-09 ENCOUNTER — Other Ambulatory Visit: Payer: Self-pay

## 2023-03-09 DIAGNOSIS — M25511 Pain in right shoulder: Secondary | ICD-10-CM | POA: Diagnosis present

## 2023-03-09 DIAGNOSIS — M19012 Primary osteoarthritis, left shoulder: Secondary | ICD-10-CM | POA: Diagnosis not present

## 2023-03-09 DIAGNOSIS — E119 Type 2 diabetes mellitus without complications: Secondary | ICD-10-CM | POA: Diagnosis not present

## 2023-03-09 DIAGNOSIS — W19XXXA Unspecified fall, initial encounter: Secondary | ICD-10-CM | POA: Diagnosis not present

## 2023-03-09 DIAGNOSIS — Z7984 Long term (current) use of oral hypoglycemic drugs: Secondary | ICD-10-CM | POA: Diagnosis not present

## 2023-03-09 DIAGNOSIS — G8929 Other chronic pain: Secondary | ICD-10-CM | POA: Diagnosis not present

## 2023-03-09 DIAGNOSIS — I10 Essential (primary) hypertension: Secondary | ICD-10-CM | POA: Insufficient documentation

## 2023-03-09 DIAGNOSIS — M25512 Pain in left shoulder: Secondary | ICD-10-CM | POA: Diagnosis not present

## 2023-03-09 MED ORDER — ACETAMINOPHEN 325 MG PO TABS
650.0000 mg | ORAL_TABLET | Freq: Once | ORAL | Status: AC
  Administered 2023-03-09: 650 mg via ORAL
  Filled 2023-03-09: qty 2

## 2023-03-09 MED ORDER — LIDOCAINE 5 % EX PTCH: 1.0000 | MEDICATED_PATCH | CUTANEOUS | 0 refills | Status: DC

## 2023-03-09 MED ORDER — LIDOCAINE 5 % EX PTCH
1.0000 | MEDICATED_PATCH | CUTANEOUS | Status: DC
  Administered 2023-03-09: 1 via TRANSDERMAL
  Filled 2023-03-09: qty 1

## 2023-03-09 NOTE — ED Triage Notes (Signed)
Pt with an old left shoulder injury,  pain with movement for weeks.

## 2023-03-09 NOTE — Discharge Instructions (Signed)
Your x-rays show that you have arthritis in your shoulder, however I cannot rule out the possibility of a ligament or tendon injury, also known as a rotator cuff injury as the source of your increased pain.  You can wear the sling for comfort, however make sure you are removing the sling twice daily to range your shoulder as we discussed to make sure it is not getting stiff.  Use the medication patch to help with pain, also recommend arthritis strength Tylenol which is the best medicine to start with for arthritis pain.  I have referred you to orthopedics for further evaluation of your shoulder problem.

## 2023-03-10 NOTE — ED Provider Notes (Signed)
[place] EMERGENCY DEPARTMENT AT [facility] Provider Note   CSN: [id number] Arrival date & time: 03/09/23  1236     History  Chief Complaint  Patient presents with   Shoulder Injury    Roselene Deemer is a 72 y.o. female with a history including type 2 diabetes, hyperlipidemia, hypertension and a history of left shoulder rotator cuff injury from a fall presenting for evaluation of worsening left shoulder pain.  She states her rotator cuff injury never completely resolved and over the past month it has been progressively worsening.  She denies any new injuries, she describes pain with movement, specifically when she tries to raise her left arm over shoulder height level.  She denies weakness or numbness in her arms or hands, denies neck pain or stiffness.  She has been taking ibuprofen which allows her enough relief to allow her to sleep but does not fully eliminate her pain symptoms.  She has not had surgery of the shoulder, she was treated with rest, [place].  She denies chest pain, shortness of breath, fevers or chills. The history is provided by the patient and a relative (grandson at bedside).       Home Medications Prior to Admission medications   Medication Sig Start Date End Date Taking? Authorizing Provider  lidocaine (LIDODERM) 5 % Place 1 patch onto the skin daily. Remove & Discard patch within 12 hours or as directed by MD 03/09/23  Yes , Raynelle Fanning, PA-C  ACCU-CHEK AVIVA PLUS test strip TEST BLOOD SUGAR EVERY DAY 08/10/20   Freddy Finner, NP  Accu-Chek Softclix Lancets lancets TEST BLOOD SUGAR EVERY DAY 01/30/22   Anabel Halon, MD  Alcohol Swabs (B-D SINGLE USE SWABS REGULAR) PADS USE AS DIRECTED EVERY DAY 08/10/20   Freddy Finner, NP  Blood Glucose Calibration (ACCU-CHEK AVIVA) SOLN USE AS DIRECTED WITH GLUCOMETER 08/10/20   Freddy Finner, NP  Blood Glucose Monitoring Suppl (ACCU-CHEK AVIVA PLUS) w/Device KIT   08/12/19   [provider]  FARXIGA 5 MG TABS tablet Take 1 tablet (5 mg total) by mouth daily. Patient not taking: Reported on 11/10/2020 02/04/20   Freddy Finner, NP  fluticasone [facility]) 50 MCG/ACT nasal spray USE 1 SPRAY INTO BOTH NOSTRILS 2 TIMES DAILY AS NEEDED FOR ALLERGIES OR RHINITIS 01/30/22   Anabel Halon, MD  gabapentin (NEURONTIN) 300 MG capsule TAKE 1 CAPSULE THREE TIMES DAILY 08/10/20   Freddy Finner, NP  glipiZIDE (GLUCOTROL XL) 5 MG 24 hr tablet TAKE 1 TABLET EVERY DAY AFTER BREAKFAST 08/10/20   Freddy Finner, NP  IBU 800 MG tablet Take 1 tablet (800 mg total) by mouth every 8 (eight) hours as needed for moderate pain. 05/27/20   Freddy Finner, NP  Lancets Misc. (ACCU-CHEK SOFTCLIX LANCET DEV) KIT USE AS DIRECTED  TO CHECK BLOOD SUGAR ONE TIME DAILY 08/10/20   Freddy Finner, NP  metFORMIN (GLUCOPHAGE) 500 MG tablet TAKE 1 TABLET WITH BREAKFAST, WITH LUNCH, AND WITH EVENING MEAL 01/24/21   Heather Roberts, NP  nystatin (MYCOSTATIN/NYSTOP) powder Apply 1 application topically 2 (two) times daily. Patient not taking: Reported on 11/10/2020 03/18/20   Freddy Finner, NP  pravastatin (PRAVACHOL) 40 MG tablet TAKE 1 TABLET EVERY DAY 08/10/20   Freddy Finner, NP  UNABLE TO FIND Accu-Chek Softclix Lacet Kit  Use as directed to check blood sugar once daily 01/13/20   Freddy Finner, NP  UNABLE TO FIND Accu-Chek Aviva Solution   Use as directed 01/13/20   Freddy Finner, NP      Allergies    Penicillins    Review of Systems   Review of Systems  Constitutional:  Negative for fever.  HENT: Negative.    Respiratory:  Negative for shortness of breath.   Cardiovascular:  Negative for chest pain.  Gastrointestinal: Negative.   Musculoskeletal:  Positive for arthralgias. Negative for joint swelling and myalgias.  Neurological:  Negative for weakness and numbness.  All other systems reviewed and are negative.   Physical Exam Updated Vital Signs BP 135/80   Pulse 76    Temp 97.9 F (36.6 C) (Oral)   Resp 17   Ht 5\' 5"  (1.651 m)   Wt 99.8 kg   SpO2 94%   BMI 36.61 kg/m  Physical Exam Constitutional:      Appearance: She is well-developed.  HENT:     Head: Atraumatic.  Cardiovascular:     Rate and Rhythm: Normal rate.     Pulses:          Radial pulses are 2+ on the right side and 2+ on the left side.     Comments: Pulses equal bilaterally Pulmonary:     Effort: Pulmonary effort is normal.  Musculoskeletal:        General: Tenderness present.     Left shoulder: Bony tenderness present. No swelling or deformity.     Cervical back: Normal range of motion.     Comments: Patient is tender to palpation along the lateral and posterior humeral head left shoulder.  There is no effusion or palpable deformity.  No crepitus with range of motion.  She does have increased pain with both active and passive anterior and the lateral extension above shoulder height level.  Skin:    General: Skin is warm and dry.  Neurological:     Mental Status: She is alert.     Sensory: No sensory deficit.     Motor: No weakness.     Deep Tendon Reflexes: Reflexes normal.     Comments: Equal grip strength.     ED Results / Procedures / Treatments   Labs (all labs ordered are listed, but only abnormal results are displayed) Labs Reviewed - No data to display  EKG None  Radiology DG Shoulder Left  Result Date: 03/09/2023 CLINICAL DATA:  Remote left shoulder injury.  Chronic pain. EXAM: LEFT SHOULDER - 2+ VIEW COMPARISON:  None Available. FINDINGS: There is diffuse decreased bone mineralization. The glenohumeral joint space is maintained. Mild acromioclavicular peripheral osteophytosis. The acromioclavicular joint is obliqued and there is bone overlap on the provided images, limiting evaluation of the joint space. No acute fracture is seen. No dislocation. IMPRESSION: Mild acromioclavicular osteoarthritis. Electronically Signed   By: Neita Garnet M.D.   On: 03/09/2023  14:41    Procedures Procedures    Medications Ordered in ED Medications  acetaminophen (TYLENOL) tablet 650 mg (650 mg Oral Given 03/09/23 1543)    ED Course/ Medical Decision Making/ A&P                                 Medical Decision Making Patient with left shoulder pain, mechanical, worse with movement, better at rest.  History of left rotator cuff injury I suspect she may have a return of this complication, however her imaging suggest she also has arthritis in the  joint as well.  No risk or exam findings to suggest septic joint.  She has no chest pain, no shortness of breath.  Doubt PE, doubt referred chest pain.  She is placed in the shoulder sling for comfort, I discussed need to maintain range of motion however with twice daily shoulder circles.  She is given a lidocaine patch, encouraged arthritis strength Tylenol.  Referral to orthopedic for further management.  Amount and/or Complexity of Data Reviewed Radiology: ordered.  Risk OTC drugs. Prescription drug management.           Final Clinical Impression(s) / ED Diagnoses Final diagnoses:  Osteoarthritis of left shoulder, unspecified osteoarthritis type    Rx / DC Orders ED Discharge Orders          Ordered    lidocaine (LIDODERM) 5 %  Every 24 hours        03/09/23 1506              Burgess Amor, PA-C 03/10/23 0112    Kommor, Wyn Forster, MD 03/10/23 1511

## 2023-03-14 DIAGNOSIS — Z6836 Body mass index (BMI) 36.0-36.9, adult: Secondary | ICD-10-CM | POA: Diagnosis not present

## 2023-03-14 DIAGNOSIS — M25512 Pain in left shoulder: Secondary | ICD-10-CM | POA: Diagnosis not present

## 2023-03-14 DIAGNOSIS — E661 Drug-induced obesity: Secondary | ICD-10-CM | POA: Diagnosis not present

## 2023-03-16 ENCOUNTER — Telehealth: Payer: Self-pay

## 2023-03-16 NOTE — Telephone Encounter (Signed)
Transition Care Management Unsuccessful Follow-up Telephone Call  Date of discharge and from where:  03/09/2023 [facility]  Attempts:  1st Attempt  Reason for unsuccessful TCM follow-up call:  Missing or invalid number Number not in service.  [facility]  [facility] Community Resource Care Guide   ??[email or web address]  ?? [phone]   [email or web address]: [email or web address]  [email or web address]

## 2023-03-26 DIAGNOSIS — M25512 Pain in left shoulder: Secondary | ICD-10-CM | POA: Diagnosis not present

## 2023-03-26 DIAGNOSIS — M542 Cervicalgia: Secondary | ICD-10-CM | POA: Diagnosis not present

## 2023-04-16 DIAGNOSIS — M5412 Radiculopathy, cervical region: Secondary | ICD-10-CM | POA: Diagnosis not present

## 2023-04-16 DIAGNOSIS — M5020 Other cervical disc displacement, unspecified cervical region: Secondary | ICD-10-CM | POA: Diagnosis not present

## 2023-04-16 DIAGNOSIS — M25512 Pain in left shoulder: Secondary | ICD-10-CM | POA: Diagnosis not present

## 2023-04-23 DIAGNOSIS — M5030 Other cervical disc degeneration, unspecified cervical region: Secondary | ICD-10-CM | POA: Diagnosis not present

## 2023-04-23 DIAGNOSIS — M79601 Pain in right arm: Secondary | ICD-10-CM | POA: Diagnosis not present

## 2023-04-23 DIAGNOSIS — M47812 Spondylosis without myelopathy or radiculopathy, cervical region: Secondary | ICD-10-CM | POA: Diagnosis not present

## 2023-04-23 DIAGNOSIS — M542 Cervicalgia: Secondary | ICD-10-CM | POA: Diagnosis not present

## 2023-04-23 DIAGNOSIS — M4802 Spinal stenosis, cervical region: Secondary | ICD-10-CM | POA: Diagnosis not present

## 2023-05-07 ENCOUNTER — Inpatient Hospital Stay (HOSPITAL_COMMUNITY)
Admission: EM | Admit: 2023-05-07 | Discharge: 2023-05-28 | DRG: 871 | Disposition: A | Discharge status: Hospice | Payer: Medicare PPO | Attending: Family Medicine | Admitting: Family Medicine

## 2023-05-07 ENCOUNTER — Encounter (HOSPITAL_COMMUNITY): Payer: Self-pay

## 2023-05-07 ENCOUNTER — Emergency Department (HOSPITAL_COMMUNITY): Payer: Medicare PPO

## 2023-05-07 DIAGNOSIS — R0602 Shortness of breath: Secondary | ICD-10-CM | POA: Diagnosis not present

## 2023-05-07 DIAGNOSIS — G9389 Other specified disorders of brain: Secondary | ICD-10-CM | POA: Diagnosis not present

## 2023-05-07 DIAGNOSIS — Z720 Tobacco use: Secondary | ICD-10-CM | POA: Diagnosis present

## 2023-05-07 DIAGNOSIS — G893 Neoplasm related pain (acute) (chronic): Secondary | ICD-10-CM | POA: Diagnosis present

## 2023-05-07 DIAGNOSIS — I82A12 Acute embolism and thrombosis of left axillary vein: Secondary | ICD-10-CM | POA: Diagnosis not present

## 2023-05-07 DIAGNOSIS — W19XXXA Unspecified fall, initial encounter: Secondary | ICD-10-CM | POA: Diagnosis not present

## 2023-05-07 DIAGNOSIS — Z515 Encounter for palliative care: Secondary | ICD-10-CM

## 2023-05-07 DIAGNOSIS — J850 Gangrene and necrosis of lung: Secondary | ICD-10-CM | POA: Diagnosis not present

## 2023-05-07 DIAGNOSIS — N179 Acute kidney failure, unspecified: Secondary | ICD-10-CM | POA: Diagnosis present

## 2023-05-07 DIAGNOSIS — E1149 Type 2 diabetes mellitus with other diabetic neurological complication: Secondary | ICD-10-CM | POA: Diagnosis present

## 2023-05-07 DIAGNOSIS — N390 Urinary tract infection, site not specified: Secondary | ICD-10-CM | POA: Diagnosis present

## 2023-05-07 DIAGNOSIS — E875 Hyperkalemia: Secondary | ICD-10-CM | POA: Diagnosis not present

## 2023-05-07 DIAGNOSIS — Z7984 Long term (current) use of oral hypoglycemic drugs: Secondary | ICD-10-CM

## 2023-05-07 DIAGNOSIS — C7931 Secondary malignant neoplasm of brain: Secondary | ICD-10-CM | POA: Diagnosis present

## 2023-05-07 DIAGNOSIS — J90 Pleural effusion, not elsewhere classified: Secondary | ICD-10-CM | POA: Diagnosis not present

## 2023-05-07 DIAGNOSIS — C3490 Malignant neoplasm of unspecified part of unspecified bronchus or lung: Secondary | ICD-10-CM | POA: Diagnosis not present

## 2023-05-07 DIAGNOSIS — L89151 Pressure ulcer of sacral region, stage 1: Secondary | ICD-10-CM | POA: Diagnosis present

## 2023-05-07 DIAGNOSIS — J9601 Acute respiratory failure with hypoxia: Secondary | ICD-10-CM

## 2023-05-07 DIAGNOSIS — I129 Hypertensive chronic kidney disease with stage 1 through stage 4 chronic kidney disease, or unspecified chronic kidney disease: Secondary | ICD-10-CM | POA: Diagnosis present

## 2023-05-07 DIAGNOSIS — D6859 Other primary thrombophilia: Secondary | ICD-10-CM | POA: Diagnosis present

## 2023-05-07 DIAGNOSIS — R222 Localized swelling, mass and lump, trunk: Secondary | ICD-10-CM | POA: Diagnosis not present

## 2023-05-07 DIAGNOSIS — Z880 Allergy status to penicillin: Secondary | ICD-10-CM

## 2023-05-07 DIAGNOSIS — R627 Adult failure to thrive: Secondary | ICD-10-CM | POA: Diagnosis present

## 2023-05-07 DIAGNOSIS — J449 Chronic obstructive pulmonary disease, unspecified: Secondary | ICD-10-CM | POA: Diagnosis present

## 2023-05-07 DIAGNOSIS — C7971 Secondary malignant neoplasm of right adrenal gland: Secondary | ICD-10-CM | POA: Diagnosis present

## 2023-05-07 DIAGNOSIS — E1169 Type 2 diabetes mellitus with other specified complication: Secondary | ICD-10-CM | POA: Diagnosis present

## 2023-05-07 DIAGNOSIS — N1831 Chronic kidney disease, stage 3a: Secondary | ICD-10-CM | POA: Diagnosis present

## 2023-05-07 DIAGNOSIS — R262 Difficulty in walking, not elsewhere classified: Secondary | ICD-10-CM | POA: Diagnosis present

## 2023-05-07 DIAGNOSIS — D649 Anemia, unspecified: Secondary | ICD-10-CM | POA: Diagnosis present

## 2023-05-07 DIAGNOSIS — Z6836 Body mass index (BMI) 36.0-36.9, adult: Secondary | ICD-10-CM

## 2023-05-07 DIAGNOSIS — M5412 Radiculopathy, cervical region: Secondary | ICD-10-CM | POA: Diagnosis not present

## 2023-05-07 DIAGNOSIS — G928 Other toxic encephalopathy: Secondary | ICD-10-CM | POA: Diagnosis present

## 2023-05-07 DIAGNOSIS — E1122 Type 2 diabetes mellitus with diabetic chronic kidney disease: Secondary | ICD-10-CM | POA: Diagnosis present

## 2023-05-07 DIAGNOSIS — C3412 Malignant neoplasm of upper lobe, left bronchus or lung: Secondary | ICD-10-CM | POA: Diagnosis present

## 2023-05-07 DIAGNOSIS — C3492 Malignant neoplasm of unspecified part of left bronchus or lung: Secondary | ICD-10-CM | POA: Diagnosis present

## 2023-05-07 DIAGNOSIS — N3000 Acute cystitis without hematuria: Secondary | ICD-10-CM | POA: Diagnosis not present

## 2023-05-07 DIAGNOSIS — R0902 Hypoxemia: Secondary | ICD-10-CM | POA: Diagnosis not present

## 2023-05-07 DIAGNOSIS — Z8249 Family history of ischemic heart disease and other diseases of the circulatory system: Secondary | ICD-10-CM

## 2023-05-07 DIAGNOSIS — E785 Hyperlipidemia, unspecified: Secondary | ICD-10-CM | POA: Diagnosis present

## 2023-05-07 DIAGNOSIS — Z823 Family history of stroke: Secondary | ICD-10-CM

## 2023-05-07 DIAGNOSIS — J9811 Atelectasis: Secondary | ICD-10-CM | POA: Diagnosis not present

## 2023-05-07 DIAGNOSIS — J9859 Other diseases of mediastinum, not elsewhere classified: Secondary | ICD-10-CM

## 2023-05-07 DIAGNOSIS — R918 Other nonspecific abnormal finding of lung field: Secondary | ICD-10-CM | POA: Diagnosis not present

## 2023-05-07 DIAGNOSIS — R1310 Dysphagia, unspecified: Secondary | ICD-10-CM | POA: Diagnosis not present

## 2023-05-07 DIAGNOSIS — S2242XA Multiple fractures of ribs, left side, initial encounter for closed fracture: Secondary | ICD-10-CM | POA: Diagnosis not present

## 2023-05-07 DIAGNOSIS — Z1152 Encounter for screening for COVID-19: Secondary | ICD-10-CM

## 2023-05-07 DIAGNOSIS — Z7189 Other specified counseling: Secondary | ICD-10-CM

## 2023-05-07 DIAGNOSIS — Y92239 Unspecified place in hospital as the place of occurrence of the external cause: Secondary | ICD-10-CM | POA: Diagnosis not present

## 2023-05-07 DIAGNOSIS — J9621 Acute and chronic respiratory failure with hypoxia: Secondary | ICD-10-CM | POA: Diagnosis not present

## 2023-05-07 DIAGNOSIS — C7970 Secondary malignant neoplasm of unspecified adrenal gland: Secondary | ICD-10-CM

## 2023-05-07 DIAGNOSIS — Z510 Encounter for antineoplastic radiation therapy: Secondary | ICD-10-CM | POA: Diagnosis not present

## 2023-05-07 DIAGNOSIS — R54 Age-related physical debility: Secondary | ICD-10-CM | POA: Diagnosis present

## 2023-05-07 DIAGNOSIS — F17210 Nicotine dependence, cigarettes, uncomplicated: Secondary | ICD-10-CM | POA: Diagnosis not present

## 2023-05-07 DIAGNOSIS — J918 Pleural effusion in other conditions classified elsewhere: Secondary | ICD-10-CM | POA: Diagnosis not present

## 2023-05-07 DIAGNOSIS — Z7401 Bed confinement status: Secondary | ICD-10-CM | POA: Diagnosis not present

## 2023-05-07 DIAGNOSIS — R5381 Other malaise: Secondary | ICD-10-CM | POA: Diagnosis present

## 2023-05-07 DIAGNOSIS — E669 Obesity, unspecified: Secondary | ICD-10-CM | POA: Diagnosis present

## 2023-05-07 DIAGNOSIS — Z66 Do not resuscitate: Secondary | ICD-10-CM | POA: Diagnosis present

## 2023-05-07 DIAGNOSIS — Z79899 Other long term (current) drug therapy: Secondary | ICD-10-CM

## 2023-05-07 DIAGNOSIS — R410 Disorientation, unspecified: Secondary | ICD-10-CM | POA: Diagnosis not present

## 2023-05-07 DIAGNOSIS — G8929 Other chronic pain: Secondary | ICD-10-CM | POA: Diagnosis not present

## 2023-05-07 DIAGNOSIS — S22000A Wedge compression fracture of unspecified thoracic vertebra, initial encounter for closed fracture: Secondary | ICD-10-CM | POA: Diagnosis not present

## 2023-05-07 DIAGNOSIS — R0603 Acute respiratory distress: Secondary | ICD-10-CM | POA: Diagnosis not present

## 2023-05-07 DIAGNOSIS — I959 Hypotension, unspecified: Secondary | ICD-10-CM | POA: Diagnosis present

## 2023-05-07 DIAGNOSIS — M25512 Pain in left shoulder: Secondary | ICD-10-CM | POA: Diagnosis present

## 2023-05-07 DIAGNOSIS — I6782 Cerebral ischemia: Secondary | ICD-10-CM | POA: Diagnosis not present

## 2023-05-07 DIAGNOSIS — R0989 Other specified symptoms and signs involving the circulatory and respiratory systems: Secondary | ICD-10-CM | POA: Diagnosis not present

## 2023-05-07 DIAGNOSIS — R5383 Other fatigue: Secondary | ICD-10-CM

## 2023-05-07 DIAGNOSIS — E538 Deficiency of other specified B group vitamins: Secondary | ICD-10-CM | POA: Diagnosis present

## 2023-05-07 DIAGNOSIS — R609 Edema, unspecified: Secondary | ICD-10-CM | POA: Diagnosis not present

## 2023-05-07 DIAGNOSIS — A419 Sepsis, unspecified organism: Secondary | ICD-10-CM | POA: Diagnosis present

## 2023-05-07 DIAGNOSIS — R531 Weakness: Secondary | ICD-10-CM | POA: Diagnosis not present

## 2023-05-07 DIAGNOSIS — T402X5A Adverse effect of other opioids, initial encounter: Secondary | ICD-10-CM | POA: Diagnosis not present

## 2023-05-07 DIAGNOSIS — K5730 Diverticulosis of large intestine without perforation or abscess without bleeding: Secondary | ICD-10-CM | POA: Diagnosis not present

## 2023-05-07 DIAGNOSIS — R6520 Severe sepsis without septic shock: Secondary | ICD-10-CM | POA: Diagnosis not present

## 2023-05-07 DIAGNOSIS — L8990 Pressure ulcer of unspecified site, unspecified stage: Secondary | ICD-10-CM | POA: Insufficient documentation

## 2023-05-07 DIAGNOSIS — E860 Dehydration: Secondary | ICD-10-CM | POA: Diagnosis present

## 2023-05-07 DIAGNOSIS — Z7901 Long term (current) use of anticoagulants: Secondary | ICD-10-CM

## 2023-05-07 DIAGNOSIS — M4854XD Collapsed vertebra, not elsewhere classified, thoracic region, subsequent encounter for fracture with routine healing: Secondary | ICD-10-CM | POA: Diagnosis present

## 2023-05-07 DIAGNOSIS — C801 Malignant (primary) neoplasm, unspecified: Secondary | ICD-10-CM | POA: Diagnosis not present

## 2023-05-07 DIAGNOSIS — R590 Localized enlarged lymph nodes: Secondary | ICD-10-CM | POA: Diagnosis not present

## 2023-05-07 DIAGNOSIS — Z9181 History of falling: Secondary | ICD-10-CM

## 2023-05-07 DIAGNOSIS — S2232XA Fracture of one rib, left side, initial encounter for closed fracture: Secondary | ICD-10-CM | POA: Diagnosis not present

## 2023-05-07 LAB — CBC WITH DIFFERENTIAL/PLATELET
Abs Immature Granulocytes: 0.08 10*3/uL — ABNORMAL HIGH (ref 0.00–0.07)
Basophils Absolute: 0 10*3/uL (ref 0.0–0.1)
Basophils Relative: 0 %
Eosinophils Absolute: 0 10*3/uL (ref 0.0–0.5)
Eosinophils Relative: 0 %
HCT: 43 % (ref 36.0–46.0)
Hemoglobin: 13.5 g/dL (ref 12.0–15.0)
Immature Granulocytes: 1 %
Lymphocytes Relative: 11 %
Lymphs Abs: 1.7 10*3/uL (ref 0.7–4.0)
MCH: 29.6 pg (ref 26.0–34.0)
MCHC: 31.4 g/dL (ref 30.0–36.0)
MCV: 94.3 fL (ref 80.0–100.0)
Monocytes Absolute: 0.7 10*3/uL (ref 0.1–1.0)
Monocytes Relative: 5 %
Neutro Abs: 12.1 10*3/uL — ABNORMAL HIGH (ref 1.7–7.7)
Neutrophils Relative %: 83 %
Platelets: 431 10*3/uL — ABNORMAL HIGH (ref 150–400)
RBC: 4.56 MIL/uL (ref 3.87–5.11)
RDW: 19.5 % — ABNORMAL HIGH (ref 11.5–15.5)
WBC: 14.7 10*3/uL — ABNORMAL HIGH (ref 4.0–10.5)
nRBC: 0 % (ref 0.0–0.2)

## 2023-05-07 NOTE — ED Provider Notes (Signed)
[place] [facility] AT [facility] Provider Note   CSN: [id number] Arrival date & time: 05/07/23  2050     History  Chief Complaint  Patient presents with   Arm Pain   Neck Pain    Madison Ho is a 72 y.o. female.  Patient with past medical history significant for type II DM, diabetic neuropathy, venous stasis, chronic left shoulder pain presents to the emergency department accompanied by her daughter complaining of continued left arm and shoulder pain and fatigue.  Patient was seen outpatient today by orthopedics for results of an MRI.  MRI results showed some nerve impingement but reportedly orthopedics felt that her pain seemed out of proportion to what would be expected with the MRI findings.  She did appear to be deconditioned orthopedics and they were concerned about her overall condition.  It was recommended she come to the emergency department for further evaluation.  Upon arrival at the emergency department she was initially hypotensive with initial BP 66/44, repeat 2 minutes later 84/54.  At the time of my assessment the blood pressure had improved to a normotensive range.  Patient's daughter reports that for the past month to month and a half the patient has been lying in bed with no energy to get out of bed.  The patient endorses decreased appetite and thirst.  She denies abdominal pain, nausea, vomiting, chest pain, shortness of breath, headache, urinary symptoms.   Arm Pain  Neck Pain      Home Medications Prior to Admission medications   Medication Sig Start Date End Date Taking? Authorizing Provider  ACCU-CHEK AVIVA PLUS test strip TEST BLOOD SUGAR EVERY DAY 08/10/20   Freddy Finner, NP  Accu-Chek Softclix Lancets lancets TEST BLOOD SUGAR EVERY DAY 01/30/22   Anabel Halon, MD  Alcohol Swabs (B-D SINGLE USE SWABS REGULAR) PADS USE AS DIRECTED EVERY DAY 08/10/20   Freddy Finner, NP  Blood Glucose Calibration (ACCU-CHEK AVIVA) SOLN USE AS  DIRECTED WITH GLUCOMETER 08/10/20   Freddy Finner, NP  Blood Glucose Monitoring Suppl (ACCU-CHEK AVIVA PLUS) w/Device KIT  08/12/19   [provider]  FARXIGA 5 MG TABS tablet Take 1 tablet (5 mg total) by mouth daily. Patient not taking: Reported on 11/10/2020 02/04/20   Freddy Finner, NP  fluticasone [facility]) 50 MCG/ACT nasal spray USE 1 SPRAY INTO BOTH NOSTRILS 2 TIMES DAILY AS NEEDED FOR ALLERGIES OR RHINITIS 01/30/22   Anabel Halon, MD  gabapentin (NEURONTIN) 300 MG capsule TAKE 1 CAPSULE THREE TIMES DAILY 08/10/20   Freddy Finner, NP  glipiZIDE (GLUCOTROL XL) 5 MG 24 hr tablet TAKE 1 TABLET EVERY DAY AFTER BREAKFAST 08/10/20   Freddy Finner, NP  IBU 800 MG tablet Take 1 tablet (800 mg total) by mouth every 8 (eight) hours as needed for moderate pain. 05/27/20   Freddy Finner, NP  Lancets Misc. (ACCU-CHEK SOFTCLIX LANCET DEV) KIT USE AS DIRECTED  TO CHECK BLOOD SUGAR ONE TIME DAILY 08/10/20   Freddy Finner, NP  lidocaine (LIDODERM) 5 % Place 1 patch onto the skin daily. Remove & Discard patch within 12 hours or as directed by MD 03/09/23   Burgess Amor, PA-C  metFORMIN (GLUCOPHAGE) 500 MG tablet TAKE 1 TABLET WITH BREAKFAST, WITH LUNCH, AND WITH EVENING MEAL 01/24/21   Heather Roberts, NP  nystatin (MYCOSTATIN/NYSTOP) powder Apply 1 application topically 2 (two) times daily. Patient not taking: Reported on 11/10/2020 03/18/20   Freddy Finner,  NP  pravastatin (PRAVACHOL) 40 MG tablet TAKE 1 TABLET EVERY DAY 08/10/20   Freddy Finner, NP  UNABLE TO FIND Accu-Chek Softclix Lacet Kit  Use as directed to check blood sugar once daily 01/13/20   Freddy Finner, NP  UNABLE TO FIND Accu-Chek Aviva Solution   Use as directed 01/13/20   Freddy Finner, NP      Allergies    Penicillins    Review of Systems   Review of Systems  Musculoskeletal:  Positive for neck pain.    Physical Exam Updated Vital Signs BP 109/64   Pulse 98   Temp (!) 97.4 F (36.3 C) (Oral)   Resp 18   SpO2 92%   Physical Exam Vitals and nursing note reviewed.  Constitutional:      General: She is not in acute distress.    Appearance: She is well-developed.  HENT:     Head: Normocephalic and atraumatic.  Eyes:     Conjunctiva/sclera: Conjunctivae normal.  Cardiovascular:     Rate and Rhythm: Regular rhythm. Tachycardia present.  Pulmonary:     Effort: Pulmonary effort is normal. No respiratory distress.     Comments: Diminished breath sounds on the left. Abdominal:     Palpations: Abdomen is soft.     Tenderness: There is no abdominal tenderness.  Musculoskeletal:        General: Tenderness present. No swelling, deformity or signs of injury.     Cervical back: Neck supple.     Right lower leg: No edema.     Left lower leg: No edema.  Skin:    General: Skin is warm and dry.     Capillary Refill: Capillary refill takes less than 2 seconds.     Comments: Palpable mass to anterior left chest. Bilateral pedal pulses are palpable.  Neurological:     Mental Status: She is alert.  Psychiatric:        Mood and Affect: Mood normal.     ED Results / Procedures / Treatments   Labs (all labs ordered are listed, but only abnormal results are displayed) Labs Reviewed  COMPREHENSIVE METABOLIC PANEL - Abnormal; Notable for the following components:      Result Value   CO2 17 (*)    Glucose, Bld 112 (*)    BUN 26 (*)    Creatinine, Ser 1.43 (*)    Albumin 2.8 (*)    GFR, Estimated 39 (*)    Anion gap 16 (*)    All other components within normal limits  CBC WITH DIFFERENTIAL/PLATELET - Abnormal; Notable for the following components:   WBC 14.7 (*)    RDW 19.5 (*)    Platelets 431 (*)    Neutro Abs 12.1 (*)    Abs Immature Granulocytes 0.08 (*)    All other components within normal limits  I-STAT CHEM 8, ED - Abnormal; Notable for the following components:   Sodium 132 (*)    Potassium 6.8 (*)    BUN 42 (*)    Creatinine, Ser 1.30 (*)    TCO2 20 (*)    All other components within  normal limits  I-STAT CG4 LACTIC ACID, ED - Abnormal; Notable for the following components:   Lactic Acid, Venous 3.3 (*)    All other components within normal limits  CULTURE, BLOOD (ROUTINE X 2)  CULTURE, BLOOD (ROUTINE X 2)  URINALYSIS, ROUTINE W REFLEX MICROSCOPIC  PROCALCITONIN  CBG MONITORING, ED  I-STAT CG4 LACTIC ACID, ED  EKG None  Radiology CT CHEST ABDOMEN PELVIS W CONTRAST  Result Date: 05/08/2023 CLINICAL DATA:  Left arm pain and weakness. Left upper lobe mass. Evaluate for metastatic disease. EXAM: CT CHEST, ABDOMEN, AND PELVIS WITH CONTRAST TECHNIQUE: Multidetector CT imaging of the chest, abdomen and pelvis was performed following the standard protocol during bolus administration of intravenous contrast. RADIATION DOSE REDUCTION: This exam was performed according to the departmental dose-optimization program which includes automated exposure control, adjustment of the mA and/or kV according to patient size and/or use of iterative reconstruction technique. CONTRAST:  75mL OMNIPAQUE IOHEXOL 350 MG/ML SOLN COMPARISON:  Chest radiograph dated 05/08/2023. FINDINGS: CT CHEST FINDINGS Cardiovascular: There is no cardiomegaly or pericardial effusion. Moderate calcified and noncalcified plaque of the thoracic aorta. No aneurysmal dilatation or dissection. The origins of the great vessels of the aortic arch appear patent. There is mass effect and compression of the left main pulmonary artery with high-grade narrowing of the vessel. The central pulmonary arteries remain patent. Mediastinum/Nodes: The esophagus is grossly unremarkable. No mediastinal fluid collection. Lungs/Pleura: Large mass occupying the entire left upper lobe extending into the left hilum and mediastinum. The mass measures approximately 13 x 17 cm in greatest axial dimensions and 15 cm in craniocaudal length. The mass abuts the descending thoracic aorta and aortic arch. There is encasement of the left main pulmonary artery  with mass effect and high-grade narrowing. The mass also abuts the distal trachea and encases the left mainstem bronchus. There is occlusion of the left upper lobe bronchus. Areas of lower attenuation with gas within the mass consistent with necrotic changes. There is invasion of the left chest wall with extension of the mass outside of the intercostal musculature into the left axilla and subpectoral tissues. A 2 x 3 cm lobulated mass in the anterior mediastinum likely contiguous with the left upper lobe mass or represent metastases. Musculoskeletal: There is infiltrative changes of the left 1st-4th ribs. Several old left lower rib fractures. Age indeterminate nondisplaced fracture of the posterior left eighth rib. There is left axillary adenopathy. There is compression of the left supraclinoid vein by the chest wall mass. Age indeterminate, likely chronic compression fracture of T9 with near complete loss of vertebral body height and anterior wedging. Additional old appearing compression fractures of T12 and L1. Correlation with clinical exam and point tenderness recommended. CT ABDOMEN PELVIS FINDINGS No intra-abdominal free air or free fluid. Hepatobiliary: The liver is unremarkable. No biliary dilatation. The gallbladder is unremarkable. Pancreas: Unremarkable. No pancreatic ductal dilatation or surrounding inflammatory changes. Spleen: Normal in size without focal abnormality. Adrenals/Urinary Tract: A 3.2 x 2.2 cm right adrenal nodule most consistent with metastatic disease. Indeterminate 1 cm left adrenal nodule. There is no hydronephrosis on either side. There is symmetric enhancement and excretion of contrast by both kidneys. The visualized ureters and urinary bladder appear unremarkable. Stomach/Bowel: There is sigmoid diverticulosis with muscular hypertrophy. No active inflammatory changes. There is loose stool in the proximal colon. No bowel obstruction or active inflammation. The appendix is normal.  Vascular/Lymphatic: Advanced calcified and noncalcified plaque of the abdominal aorta. The aorta is ectatic measuring 2.5 cm in diameter. The IVC is unremarkable. No portal venous gas. No adenopathy. Reproductive: The uterus is grossly unremarkable. No adnexal masses. Other: None Musculoskeletal: Osteopenia. Chronic appearing L1 compression fracture. IMPRESSION: 1. Large left upper lobe mass as above. Multidisciplinary consult is advised. 2. Right adrenal nodule most consistent with metastatic disease. 3. Age indeterminate, likely chronic compression fracture of T9. Correlation with  clinical exam and point tenderness recommended. 4. Sigmoid diverticulosis. No bowel obstruction. Normal appendix. 5.  Aortic Atherosclerosis (ICD10-I70.0). Electronically Signed   By: Elgie Collard M.D.   On: 05/08/2023 03:07   DG Shoulder Left  Result Date: 05/08/2023 CLINICAL DATA:  Worsening shoulder pain EXAM: LEFT SHOULDER - 2+ VIEW COMPARISON:  Radiographs 03/09/2023 FINDINGS: No acute fracture or dislocation of the left shoulder. Age indeterminate left rib fractures. Airspace opacities in the left lung, see separate details. IMPRESSION: 1. No acute fracture or dislocation of the left shoulder. 2. Age indeterminate left rib fractures. 3. Airspace opacities in the left lung, see separate details. Electronically Signed   By: Minerva Fester M.D.   On: 05/08/2023 00:49   DG Chest 2 View  Result Date: 05/08/2023 CLINICAL DATA:  Weakness EXAM: CHEST - 2 VIEW COMPARISON:  None Available. FINDINGS: Dense consolidation within the a left upper lobe with mass effect resulting in deviation of the trachea to the right. Right lung is clear. No pneumothorax or pleural effusion. Cardiac size is within normal limits. IMPRESSION: 1. Dense consolidation within the left upper lobe with mass effect resulting in deviation of the trachea to the right. While this may reflect pneumonia, underlying neoplasm is not excluded. CT examination is  recommended for further evaluation. Electronically Signed   By: Helyn Numbers M.D.   On: 05/08/2023 00:46    Procedures .Critical Care  Performed by: Darrick Grinder, PA-C Authorized by: Darrick Grinder, PA-C   Critical care provider statement:    Critical care time (minutes):  30   Critical care was time spent personally by me on the following activities:  Development of treatment plan with patient or surrogate, discussions with consultants, evaluation of patient's response to treatment, examination of patient, ordering and review of laboratory studies, ordering and review of radiographic studies, ordering and performing treatments and interventions, pulse oximetry, re-evaluation of patient's condition and review of old charts   Care discussed with: admitting provider       Medications Ordered in ED Medications  lactated ringers infusion ( Intravenous New Bag/Given 05/08/23 0343)  metroNIDAZOLE (FLAGYL) IVPB 500 mg (500 mg Intravenous New Bag/Given 05/08/23 0336)  lactated ringers bolus 1,000 mL (0 mLs Intravenous Stopped 05/08/23 0334)    And  lactated ringers bolus 1,000 mL (1,000 mLs Intravenous New Bag/Given 05/08/23 0337)    And  lactated ringers bolus 1,000 mL (has no administration in time range)    And  lactated ringers bolus 500 mL (has no administration in time range)  vancomycin (VANCOREADY) IVPB 1500 mg/300 mL (has no administration in time range)  ceFEPIme (MAXIPIME) 2 g in sodium chloride 0.9 % 100 mL IVPB (0 g Intravenous Stopped 05/08/23 0334)  iohexol (OMNIPAQUE) 350 MG/ML injection 75 mL (75 mLs Intravenous Contrast Given 05/08/23 0231)    ED Course/ Medical Decision Making/ A&P                                 Medical Decision Making Amount and/or Complexity of Data Reviewed Labs: ordered. Radiology: ordered.  Risk Prescription drug management.   This patient presents to the ED for concern of left shoulder pain and deconditioning, this involves an  extensive number of treatment options, and is a complaint that carries with it a high risk of complications and morbidity.  The differential diagnosis includes dysrhythmia, failure to thrive, metabolic abnormality, deconditioning, others   Co morbidities that complicate  the patient evaluation  Type II DM with neuropathy, venous stasis   Additional history obtained:  Additional history obtained from patient's daughter at bedside External records from outside source obtained and reviewed including outside MRI results from MRI on September 23. C2-3: Mild right and moderate left facet arthrosis and minimal disc bulging result in mild left neural foraminal stenosis without spinal stenosis.  C3-4: Minimal disc bulging, uncovertebral spurring, and moderate facet arthrosis result in mild right and moderate left neural foraminal stenosis without spinal stenosis.  C4-5: Minimal disc bulging, uncovertebral spurring, and mild right and moderate left facet arthrosis result in mild-to-moderate left neural foraminal stenosis without spinal stenosis.  C5-6: Disc bulging, uncovertebral spurring, and mild-to-moderate facet arthrosis result in moderate right and mild left neural foraminal stenosis without spinal stenosis.  C6-7: Disc bulging results in moderate bilateral neural foraminal stenosis without spinal stenosis.  C7-T1: Negative.    Lab Tests:  I Ordered, and personally interpreted labs.  The pertinent results include:  WBC 14.7, creatinine 1.43, EGFR 39, anion gap 16.  Initial lactic acid 3.3, repeat 1.9   Imaging Studies ordered:  I ordered imaging studies including plain films of the left shoulder and chest, CT chest abdomen pelvis with contrast. I independently visualized and interpreted imaging which showed left shoulder films with age-indeterminate fractures.  Chest x-ray with 1. Dense consolidation within the left upper lobe with mass effect  resulting in deviation of the  trachea to the right. While this may  reflect pneumonia, underlying neoplasm is not excluded.  CT chest abdomen pelvis concerning for thoracic mass, adrenal mass concerning for large tumor with metastatic disease.  See full CT result. I agree with the radiologist interpretation   Cardiac Monitoring: / EKG:  The patient was maintained on a cardiac monitor.  I personally viewed and interpreted the cardiac monitored which showed an underlying rhythm of: sinus rhythm   Consultations Obtained:  I requested consultation with the hospitalist, Dr.[email or web address]-Ghosh and discussed lab and imaging findings as well as pertinent plan - they recommend: admission   Problem List / ED Course / Critical interventions / Medication management   I ordered medication including antibiotics and lactated Ringer's for SIRS criteria Reevaluation of the patient after these medicines showed that the patient improved I have reviewed the patients home medicines and have made adjustments as needed    Test / Admission - Considered:  Patient initially meeting SIRS criteria with hypotension, elevated lactic acid level, leukocytosis, tachycardia.  Patient started on broad-spectrum antibiotics with fluid boluses.  Hypotension does seem to be positional.  As patient rested and lay down hypotension resolved.  Patient complained to the left shoulder pain but also noted a mass to the anterior left chest which was palpable.  CT scan findings concerning for large mass and potential metastases with additional mass noted at the right adrenal gland.  Patient comfortable at this time but needs admission for further workup.  Fatigue may be due to obstruction of vessels with mass effect and tracheal deviation.         Final Clinical Impression(s) / ED Diagnoses Final diagnoses:  Mass of thoracic structure  Fatigue, unspecified type    Rx / DC Orders ED Discharge Orders     None         Pamala Duffel 05/08/23  0349    Sabas Sous, MD 05/08/23 [phone]

## 2023-05-07 NOTE — ED Provider Notes (Incomplete)
[place] EMERGENCY DEPARTMENT AT [facility] Provider Note   CSN: [id number] Arrival date & time: 05/07/23  2050     History {Add pertinent medical, surgical, social history, OB history to HPI:1} Chief Complaint  Patient presents with  . Arm Pain  . Neck Pain    Madison Ho is a 72 y.o. female.  Patient with past medical history significant for type II DM, diabetic neuropathy, venous stasis, chronic left shoulder pain presents to the emergency department accompanied by her daughter complaining of continued left arm and shoulder pain and fatigue.  Patient was seen outpatient today by orthopedics for results of an MRI.  MRI results showed some nerve impingement but reportedly orthopedics felt that her pain seemed out of proportion to what would be expected with the MRI findings.  She did appear to be deconditioned orthopedics and they were concerned about her overall condition.  It was recommended she come to the emergency department for further evaluation.  Upon arrival at the emergency department she was initially hypotensive with initial BP 66/44, repeat 2 minutes later 84/54.  At the time of my assessment the blood pressure had improved to a normotensive range.  Patient's daughter reports that for the past month to month and a half the patient has been lying in bed with no energy to get out of bed.  The patient endorses decreased appetite and thirst.  She denies abdominal pain, nausea, vomiting, chest pain, shortness of breath, headache, urinary symptoms.   Arm Pain  Neck Pain      Home Medications Prior to Admission medications   Medication Sig Start Date End Date Taking? Authorizing Provider  ACCU-CHEK AVIVA PLUS test strip TEST BLOOD SUGAR EVERY DAY 08/10/20   Freddy Finner, NP  Accu-Chek Softclix Lancets lancets TEST BLOOD SUGAR EVERY DAY 01/30/22   Anabel Halon, MD  Alcohol Swabs (B-D SINGLE USE SWABS REGULAR) PADS USE AS DIRECTED EVERY DAY 08/10/20   Freddy Finner, NP  Blood Glucose Calibration (ACCU-CHEK AVIVA) SOLN USE AS DIRECTED WITH GLUCOMETER 08/10/20   Freddy Finner, NP  Blood Glucose Monitoring Suppl (ACCU-CHEK AVIVA PLUS) w/Device KIT  08/12/19   [provider]  FARXIGA 5 MG TABS tablet Take 1 tablet (5 mg total) by mouth daily. Patient not taking: Reported on 11/10/2020 02/04/20   Freddy Finner, NP  fluticasone [facility]) 50 MCG/ACT nasal spray USE 1 SPRAY INTO BOTH NOSTRILS 2 TIMES DAILY AS NEEDED FOR ALLERGIES OR RHINITIS 01/30/22   Anabel Halon, MD  gabapentin (NEURONTIN) 300 MG capsule TAKE 1 CAPSULE THREE TIMES DAILY 08/10/20   Freddy Finner, NP  glipiZIDE (GLUCOTROL XL) 5 MG 24 hr tablet TAKE 1 TABLET EVERY DAY AFTER BREAKFAST 08/10/20   Freddy Finner, NP  IBU 800 MG tablet Take 1 tablet (800 mg total) by mouth every 8 (eight) hours as needed for moderate pain. 05/27/20   Freddy Finner, NP  Lancets Misc. (ACCU-CHEK SOFTCLIX LANCET DEV) KIT USE AS DIRECTED  TO CHECK BLOOD SUGAR ONE TIME DAILY 08/10/20   Freddy Finner, NP  lidocaine (LIDODERM) 5 % Place 1 patch onto the skin daily. Remove & Discard patch within 12 hours or as directed by MD 03/09/23   Burgess Amor, PA-C  metFORMIN (GLUCOPHAGE) 500 MG tablet TAKE 1 TABLET WITH BREAKFAST, WITH LUNCH, AND WITH EVENING MEAL 01/24/21   Heather Roberts, NP  nystatin (MYCOSTATIN/NYSTOP) powder Apply 1 application topically 2 (two) times daily. Patient not taking:  Reported on 11/10/2020 03/18/20   Freddy Finner, NP  pravastatin (PRAVACHOL) 40 MG tablet TAKE 1 TABLET EVERY DAY 08/10/20   Freddy Finner, NP  UNABLE TO FIND Accu-Chek Softclix Lacet Kit  Use as directed to check blood sugar once daily 01/13/20   Freddy Finner, NP  UNABLE TO FIND Accu-Chek Aviva Solution   Use as directed 01/13/20   Freddy Finner, NP      Allergies    Penicillins    Review of Systems   Review of Systems  Musculoskeletal:  Positive for neck pain.    Physical Exam Updated Vital Signs BP (!)  84/54 (BP Location: Right Arm)   Pulse (!) 101   Temp (!) 97.4 F (36.3 C) (Oral)   Resp 14   SpO2 97%  Physical Exam  ED Results / Procedures / Treatments   Labs (all labs ordered are listed, but only abnormal results are displayed) Labs Reviewed  URINALYSIS, ROUTINE W REFLEX MICROSCOPIC  COMPREHENSIVE METABOLIC PANEL  CBC WITH DIFFERENTIAL/PLATELET  CBG MONITORING, ED  I-STAT CHEM 8, ED  I-STAT CG4 LACTIC ACID, ED    EKG None  Radiology No results found.  Procedures Procedures  {Document cardiac monitor, telemetry assessment procedure when appropriate:1}  Medications Ordered in ED Medications - No data to display  ED Course/ Medical Decision Making/ A&P   {   Click here for ABCD2, HEART and other calculatorsREFRESH Note before signing :1}                              Medical Decision Making Amount and/or Complexity of Data Reviewed Labs: ordered. Radiology: ordered.   This patient presents to the ED for concern of left shoulder pain and deconditioning, this involves an extensive number of treatment options, and is a complaint that carries with it a high risk of complications and morbidity.  The differential diagnosis includes dysrhythmia, failure to thrive, metabolic abnormality, deconditioning, others   Co morbidities that complicate the patient evaluation  Type II DM with neuropathy, venous stasis   Additional history obtained:  Additional history obtained from patient's daughter at bedside External records from outside source obtained and reviewed including outside MRI results from MRI on September 23. C2-3: Mild right and moderate left facet arthrosis and minimal disc bulging result in mild left neural foraminal stenosis without spinal stenosis.  C3-4: Minimal disc bulging, uncovertebral spurring, and moderate facet arthrosis result in mild right and moderate left neural foraminal stenosis without spinal stenosis.  C4-5: Minimal disc bulging,  uncovertebral spurring, and mild right and moderate left facet arthrosis result in mild-to-moderate left neural foraminal stenosis without spinal stenosis.  C5-6: Disc bulging, uncovertebral spurring, and mild-to-moderate facet arthrosis result in moderate right and mild left neural foraminal stenosis without spinal stenosis.  C6-7: Disc bulging results in moderate bilateral neural foraminal stenosis without spinal stenosis.  C7-T1: Negative.    Lab Tests:  I Ordered, and personally interpreted labs.  The pertinent results include:  ***   Imaging Studies ordered:  I ordered imaging studies including ***  I independently visualized and interpreted imaging which showed *** I agree with the radiologist interpretation   Cardiac Monitoring: / EKG:  The patient was maintained on a cardiac monitor.  I personally viewed and interpreted the cardiac monitored which showed an underlying rhythm of: ***   Consultations Obtained:  I requested consultation with the ***,  and discussed lab and imaging findings  as well as pertinent plan - they recommend: ***   Problem List / ED Course / Critical interventions / Medication management  *** I ordered medication including ***  for ***  Reevaluation of the patient after these medicines showed that the patient {resolved/improved/worsened:23923::"improved"} I have reviewed the patients home medicines and have made adjustments as needed   Social Determinants of Health:  ***   Test / Admission - Considered:  ***   {Document critical care time when appropriate:1} {Document review of labs and clinical decision tools ie heart score, Chads2Vasc2 etc:1}  {Document your independent review of radiology images, and any outside records:1} {Document your discussion with family members, caretakers, and with consultants:1} {Document social determinants of health affecting pt's care:1} {Document your decision making why or why not admission,  treatments were needed:1} Final Clinical Impression(s) / ED Diagnoses Final diagnoses:  None    Rx / DC Orders ED Discharge Orders     None

## 2023-05-07 NOTE — ED Triage Notes (Addendum)
Pt to ED with complaint of left arm pain x2 months. Seen by OSH ED at that time and given lidocaine patch and tylenol. Seen by ortho for same as well - had MRI that showed pinched nerves in back. Daughter reports patient has continuously deteriorated over last 2 months. Unable to get out of bed. Reports increased fatigue. Pale and diaphoretic in triage.

## 2023-05-08 ENCOUNTER — Emergency Department (HOSPITAL_COMMUNITY): Payer: Medicare PPO

## 2023-05-08 ENCOUNTER — Other Ambulatory Visit: Payer: Self-pay

## 2023-05-08 ENCOUNTER — Encounter (HOSPITAL_COMMUNITY): Payer: Self-pay | Admitting: Family Medicine

## 2023-05-08 DIAGNOSIS — N179 Acute kidney failure, unspecified: Secondary | ICD-10-CM | POA: Diagnosis present

## 2023-05-08 DIAGNOSIS — Z720 Tobacco use: Secondary | ICD-10-CM | POA: Diagnosis present

## 2023-05-08 DIAGNOSIS — E538 Deficiency of other specified B group vitamins: Secondary | ICD-10-CM | POA: Diagnosis not present

## 2023-05-08 DIAGNOSIS — C7970 Secondary malignant neoplasm of unspecified adrenal gland: Secondary | ICD-10-CM | POA: Diagnosis not present

## 2023-05-08 DIAGNOSIS — R918 Other nonspecific abnormal finding of lung field: Secondary | ICD-10-CM | POA: Diagnosis present

## 2023-05-08 DIAGNOSIS — Z1152 Encounter for screening for COVID-19: Secondary | ICD-10-CM | POA: Diagnosis not present

## 2023-05-08 DIAGNOSIS — R410 Disorientation, unspecified: Secondary | ICD-10-CM | POA: Diagnosis not present

## 2023-05-08 DIAGNOSIS — F17210 Nicotine dependence, cigarettes, uncomplicated: Secondary | ICD-10-CM | POA: Diagnosis not present

## 2023-05-08 DIAGNOSIS — R1310 Dysphagia, unspecified: Secondary | ICD-10-CM | POA: Diagnosis not present

## 2023-05-08 DIAGNOSIS — W19XXXA Unspecified fall, initial encounter: Secondary | ICD-10-CM | POA: Diagnosis not present

## 2023-05-08 DIAGNOSIS — R531 Weakness: Secondary | ICD-10-CM | POA: Diagnosis not present

## 2023-05-08 DIAGNOSIS — C801 Malignant (primary) neoplasm, unspecified: Secondary | ICD-10-CM | POA: Diagnosis not present

## 2023-05-08 DIAGNOSIS — R0603 Acute respiratory distress: Secondary | ICD-10-CM | POA: Diagnosis not present

## 2023-05-08 DIAGNOSIS — Z7189 Other specified counseling: Secondary | ICD-10-CM | POA: Diagnosis not present

## 2023-05-08 DIAGNOSIS — R222 Localized swelling, mass and lump, trunk: Principal | ICD-10-CM | POA: Insufficient documentation

## 2023-05-08 DIAGNOSIS — N3000 Acute cystitis without hematuria: Secondary | ICD-10-CM | POA: Diagnosis not present

## 2023-05-08 DIAGNOSIS — R5383 Other fatigue: Secondary | ICD-10-CM | POA: Diagnosis not present

## 2023-05-08 DIAGNOSIS — E1122 Type 2 diabetes mellitus with diabetic chronic kidney disease: Secondary | ICD-10-CM | POA: Diagnosis present

## 2023-05-08 DIAGNOSIS — R5381 Other malaise: Secondary | ICD-10-CM | POA: Diagnosis not present

## 2023-05-08 DIAGNOSIS — G8929 Other chronic pain: Secondary | ICD-10-CM | POA: Diagnosis not present

## 2023-05-08 DIAGNOSIS — J9621 Acute and chronic respiratory failure with hypoxia: Secondary | ICD-10-CM | POA: Diagnosis not present

## 2023-05-08 DIAGNOSIS — E1169 Type 2 diabetes mellitus with other specified complication: Secondary | ICD-10-CM

## 2023-05-08 DIAGNOSIS — Z7401 Bed confinement status: Secondary | ICD-10-CM | POA: Diagnosis not present

## 2023-05-08 DIAGNOSIS — Z79899 Other long term (current) drug therapy: Secondary | ICD-10-CM | POA: Diagnosis not present

## 2023-05-08 DIAGNOSIS — N390 Urinary tract infection, site not specified: Secondary | ICD-10-CM | POA: Diagnosis present

## 2023-05-08 DIAGNOSIS — M25512 Pain in left shoulder: Secondary | ICD-10-CM | POA: Diagnosis not present

## 2023-05-08 DIAGNOSIS — C7931 Secondary malignant neoplasm of brain: Secondary | ICD-10-CM | POA: Diagnosis present

## 2023-05-08 DIAGNOSIS — R609 Edema, unspecified: Secondary | ICD-10-CM | POA: Diagnosis not present

## 2023-05-08 DIAGNOSIS — E1149 Type 2 diabetes mellitus with other diabetic neurological complication: Secondary | ICD-10-CM | POA: Diagnosis present

## 2023-05-08 DIAGNOSIS — J449 Chronic obstructive pulmonary disease, unspecified: Secondary | ICD-10-CM | POA: Diagnosis present

## 2023-05-08 DIAGNOSIS — N1831 Chronic kidney disease, stage 3a: Secondary | ICD-10-CM | POA: Diagnosis present

## 2023-05-08 DIAGNOSIS — L89151 Pressure ulcer of sacral region, stage 1: Secondary | ICD-10-CM | POA: Diagnosis present

## 2023-05-08 DIAGNOSIS — E669 Obesity, unspecified: Secondary | ICD-10-CM | POA: Diagnosis present

## 2023-05-08 DIAGNOSIS — C3412 Malignant neoplasm of upper lobe, left bronchus or lung: Secondary | ICD-10-CM | POA: Diagnosis present

## 2023-05-08 DIAGNOSIS — D649 Anemia, unspecified: Secondary | ICD-10-CM | POA: Diagnosis present

## 2023-05-08 DIAGNOSIS — Z510 Encounter for antineoplastic radiation therapy: Secondary | ICD-10-CM | POA: Diagnosis not present

## 2023-05-08 DIAGNOSIS — Z66 Do not resuscitate: Secondary | ICD-10-CM | POA: Diagnosis present

## 2023-05-08 DIAGNOSIS — A419 Sepsis, unspecified organism: Secondary | ICD-10-CM | POA: Diagnosis present

## 2023-05-08 DIAGNOSIS — J9859 Other diseases of mediastinum, not elsewhere classified: Secondary | ICD-10-CM

## 2023-05-08 DIAGNOSIS — Y92239 Unspecified place in hospital as the place of occurrence of the external cause: Secondary | ICD-10-CM | POA: Diagnosis not present

## 2023-05-08 DIAGNOSIS — I959 Hypotension, unspecified: Secondary | ICD-10-CM | POA: Diagnosis not present

## 2023-05-08 DIAGNOSIS — Z515 Encounter for palliative care: Secondary | ICD-10-CM | POA: Diagnosis not present

## 2023-05-08 DIAGNOSIS — J90 Pleural effusion, not elsewhere classified: Secondary | ICD-10-CM | POA: Diagnosis not present

## 2023-05-08 DIAGNOSIS — S22000A Wedge compression fracture of unspecified thoracic vertebra, initial encounter for closed fracture: Secondary | ICD-10-CM | POA: Diagnosis present

## 2023-05-08 DIAGNOSIS — R6520 Severe sepsis without septic shock: Secondary | ICD-10-CM

## 2023-05-08 DIAGNOSIS — C7971 Secondary malignant neoplasm of right adrenal gland: Secondary | ICD-10-CM | POA: Diagnosis present

## 2023-05-08 DIAGNOSIS — I129 Hypertensive chronic kidney disease with stage 1 through stage 4 chronic kidney disease, or unspecified chronic kidney disease: Secondary | ICD-10-CM | POA: Diagnosis present

## 2023-05-08 DIAGNOSIS — J9601 Acute respiratory failure with hypoxia: Secondary | ICD-10-CM | POA: Diagnosis not present

## 2023-05-08 DIAGNOSIS — D6859 Other primary thrombophilia: Secondary | ICD-10-CM | POA: Diagnosis present

## 2023-05-08 DIAGNOSIS — J918 Pleural effusion in other conditions classified elsewhere: Secondary | ICD-10-CM | POA: Diagnosis not present

## 2023-05-08 DIAGNOSIS — C3490 Malignant neoplasm of unspecified part of unspecified bronchus or lung: Secondary | ICD-10-CM | POA: Diagnosis not present

## 2023-05-08 DIAGNOSIS — C3492 Malignant neoplasm of unspecified part of left bronchus or lung: Secondary | ICD-10-CM | POA: Diagnosis not present

## 2023-05-08 DIAGNOSIS — I6782 Cerebral ischemia: Secondary | ICD-10-CM | POA: Diagnosis not present

## 2023-05-08 DIAGNOSIS — E785 Hyperlipidemia, unspecified: Secondary | ICD-10-CM

## 2023-05-08 DIAGNOSIS — I82A12 Acute embolism and thrombosis of left axillary vein: Secondary | ICD-10-CM | POA: Diagnosis not present

## 2023-05-08 DIAGNOSIS — G9389 Other specified disorders of brain: Secondary | ICD-10-CM | POA: Diagnosis not present

## 2023-05-08 DIAGNOSIS — G928 Other toxic encephalopathy: Secondary | ICD-10-CM | POA: Diagnosis not present

## 2023-05-08 LAB — CBG MONITORING, ED
Glucose-Capillary: 84 mg/dL (ref 70–99)
Glucose-Capillary: 91 mg/dL (ref 70–99)
Glucose-Capillary: 145 mg/dL — ABNORMAL HIGH (ref 70–99)

## 2023-05-08 LAB — COMPREHENSIVE METABOLIC PANEL
ALT: 10 U/L (ref 0–44)
ALT: 12 U/L (ref 0–44)
AST: 21 U/L (ref 15–41)
AST: 24 U/L (ref 15–41)
Albumin: 2.1 g/dL — ABNORMAL LOW (ref 3.5–5.0)
Albumin: 2.8 g/dL — ABNORMAL LOW (ref 3.5–5.0)
Alkaline Phosphatase: 40 U/L (ref 38–126)
Alkaline Phosphatase: 55 U/L (ref 38–126)
Anion gap: 14 (ref 5–15)
Anion gap: 16 — ABNORMAL HIGH (ref 5–15)
BUN: 21 mg/dL (ref 8–23)
BUN: 26 mg/dL — ABNORMAL HIGH (ref 8–23)
CO2: 17 mmol/L — ABNORMAL LOW (ref 22–32)
CO2: 19 mmol/L — ABNORMAL LOW (ref 22–32)
Calcium: 8.5 mg/dL — ABNORMAL LOW (ref 8.9–10.3)
Calcium: 9.4 mg/dL (ref 8.9–10.3)
Chloride: 103 mmol/L (ref 98–111)
Chloride: 99 mmol/L (ref 98–111)
Creatinine, Ser: 1.02 mg/dL — ABNORMAL HIGH (ref 0.44–1.00)
Creatinine, Ser: 1.43 mg/dL — ABNORMAL HIGH (ref 0.44–1.00)
GFR, Estimated: 39 mL/min — ABNORMAL LOW (ref >60)
GFR, Estimated: 58 mL/min — ABNORMAL LOW (ref >60)
Glucose, Bld: 112 mg/dL — ABNORMAL HIGH (ref 70–99)
Glucose, Bld: 76 mg/dL (ref 70–99)
Potassium: 4 mmol/L (ref 3.5–5.1)
Potassium: 4.1 mmol/L (ref 3.5–5.1)
Sodium: 132 mmol/L — ABNORMAL LOW (ref 135–145)
Sodium: 136 mmol/L (ref 135–145)
Total Bilirubin: 0.7 mg/dL (ref 0.3–1.2)
Total Bilirubin: 0.8 mg/dL (ref 0.3–1.2)
Total Protein: 5.9 g/dL — ABNORMAL LOW (ref 6.5–8.1)
Total Protein: 7.8 g/dL (ref 6.5–8.1)

## 2023-05-08 LAB — URINALYSIS, COMPLETE (UACMP) WITH MICROSCOPIC
Bilirubin Urine: NEGATIVE
Glucose, UA: NEGATIVE mg/dL
Hgb urine dipstick: NEGATIVE
Ketones, ur: 5 mg/dL — AB
Nitrite: POSITIVE — AB
Protein, ur: NEGATIVE mg/dL
Specific Gravity, Urine: 1.034 — ABNORMAL HIGH (ref 1.005–1.030)
pH: 5 (ref 5.0–8.0)

## 2023-05-08 LAB — PROCALCITONIN: Procalcitonin: 2.73 ng/mL

## 2023-05-08 LAB — TSH: TSH: 0.607 u[IU]/mL (ref 0.350–4.500)

## 2023-05-08 LAB — I-STAT CHEM 8, ED
BUN: 42 mg/dL — ABNORMAL HIGH (ref 8–23)
Calcium, Ion: 1.15 mmol/L (ref 1.15–1.40)
Chloride: 105 mmol/L (ref 98–111)
Creatinine, Ser: 1.3 mg/dL — ABNORMAL HIGH (ref 0.44–1.00)
Glucose, Bld: 96 mg/dL (ref 70–99)
HCT: 40 % (ref 36.0–46.0)
Hemoglobin: 13.6 g/dL (ref 12.0–15.0)
Potassium: 6.8 mmol/L (ref 3.5–5.1)
Sodium: 132 mmol/L — ABNORMAL LOW (ref 135–145)
TCO2: 20 mmol/L — ABNORMAL LOW (ref 22–32)

## 2023-05-08 LAB — CBC WITH DIFFERENTIAL/PLATELET
Abs Immature Granulocytes: 0.08 10*3/uL — ABNORMAL HIGH (ref 0.00–0.07)
Basophils Absolute: 0 10*3/uL (ref 0.0–0.1)
Basophils Relative: 0 %
Eosinophils Absolute: 0 10*3/uL (ref 0.0–0.5)
Eosinophils Relative: 0 %
HCT: 34.5 % — ABNORMAL LOW (ref 36.0–46.0)
Hemoglobin: 11.1 g/dL — ABNORMAL LOW (ref 12.0–15.0)
Immature Granulocytes: 1 %
Lymphocytes Relative: 18 %
Lymphs Abs: 2.3 10*3/uL (ref 0.7–4.0)
MCH: 30.3 pg (ref 26.0–34.0)
MCHC: 32.2 g/dL (ref 30.0–36.0)
MCV: 94.3 fL (ref 80.0–100.0)
Monocytes Absolute: 0.8 10*3/uL (ref 0.1–1.0)
Monocytes Relative: 6 %
Neutro Abs: 9.2 10*3/uL — ABNORMAL HIGH (ref 1.7–7.7)
Neutrophils Relative %: 75 %
Platelets: 380 10*3/uL (ref 150–400)
RBC: 3.66 MIL/uL — ABNORMAL LOW (ref 3.87–5.11)
RDW: 19.6 % — ABNORMAL HIGH (ref 11.5–15.5)
WBC: 12.4 10*3/uL — ABNORMAL HIGH (ref 4.0–10.5)
nRBC: 0 % (ref 0.0–0.2)

## 2023-05-08 LAB — I-STAT CG4 LACTIC ACID, ED
Lactic Acid, Venous: 1.9 mmol/L (ref 0.5–1.9)
Lactic Acid, Venous: 3.3 mmol/L (ref 0.5–1.9)

## 2023-05-08 LAB — HEMOGLOBIN A1C
Hgb A1c MFr Bld: 5.8 % — ABNORMAL HIGH (ref 4.8–5.6)
Mean Plasma Glucose: 119.76 mg/dL

## 2023-05-08 LAB — SARS CORONAVIRUS 2 BY RT PCR: SARS Coronavirus 2 by RT PCR: NEGATIVE

## 2023-05-08 LAB — MAGNESIUM: Magnesium: 1.3 mg/dL — ABNORMAL LOW (ref 1.7–2.4)

## 2023-05-08 MED ORDER — ALBUTEROL SULFATE (2.5 MG/3ML) 0.083% IN NEBU: 2.5000 mg | INHALATION_SOLUTION | RESPIRATORY_TRACT | Status: DC | PRN

## 2023-05-08 MED ORDER — LACTATED RINGERS IV BOLUS (SEPSIS)
1000.0000 mL | Freq: Once | INTRAVENOUS | Status: AC
  Administered 2023-05-08: 1000 mL via INTRAVENOUS

## 2023-05-08 MED ORDER — SODIUM CHLORIDE 0.9 % IV SOLN
2.0000 g | INTRAVENOUS | Status: AC
  Administered 2023-05-08 – 2023-05-10 (×3): 2 g via INTRAVENOUS
  Filled 2023-05-08 (×3): qty 20

## 2023-05-08 MED ORDER — ONDANSETRON HCL 4 MG PO TABS: 4.0000 mg | ORAL_TABLET | Freq: Four times a day (QID) | ORAL | Status: DC | PRN

## 2023-05-08 MED ORDER — SODIUM CHLORIDE 0.9 % IV SOLN: INTRAVENOUS | Status: DC

## 2023-05-08 MED ORDER — OXYCODONE HCL 5 MG PO TABS
5.0000 mg | ORAL_TABLET | ORAL | Status: DC | PRN
  Administered 2023-05-08 – 2023-05-14 (×13): 5 mg via ORAL
  Filled 2023-05-08 (×14): qty 1

## 2023-05-08 MED ORDER — HEPARIN SODIUM (PORCINE) 5000 UNIT/ML IJ SOLN
5000.0000 [IU] | Freq: Three times a day (TID) | INTRAMUSCULAR | Status: DC
  Administered 2023-05-08 (×2): 5000 [IU] via SUBCUTANEOUS
  Filled 2023-05-08 (×2): qty 1

## 2023-05-08 MED ORDER — ACETAMINOPHEN 325 MG PO TABS
650.0000 mg | ORAL_TABLET | Freq: Four times a day (QID) | ORAL | Status: DC | PRN
  Administered 2023-05-08: 650 mg via ORAL
  Filled 2023-05-08: qty 2

## 2023-05-08 MED ORDER — LACTATED RINGERS IV BOLUS (SEPSIS)
500.0000 mL | Freq: Once | INTRAVENOUS | Status: AC
  Administered 2023-05-08: 500 mL via INTRAVENOUS

## 2023-05-08 MED ORDER — SODIUM CHLORIDE 0.9 % IV SOLN: 2.0000 g | Freq: Once | INTRAVENOUS | Status: DC

## 2023-05-08 MED ORDER — IOHEXOL 350 MG/ML SOLN
75.0000 mL | Freq: Once | INTRAVENOUS | Status: AC | PRN
  Administered 2023-05-08: 75 mL via INTRAVENOUS

## 2023-05-08 MED ORDER — HEPARIN SODIUM (PORCINE) 5000 UNIT/ML IJ SOLN: 5000.0000 [IU] | Freq: Three times a day (TID) | INTRAMUSCULAR | Status: DC

## 2023-05-08 MED ORDER — SODIUM CHLORIDE 0.9 % IV BOLUS: 1000.0000 mL | Freq: Once | INTRAVENOUS | Status: DC

## 2023-05-08 MED ORDER — PRAVASTATIN SODIUM 40 MG PO TABS
40.0000 mg | ORAL_TABLET | Freq: Every day | ORAL | Status: DC
  Administered 2023-05-08 – 2023-05-09 (×2): 40 mg via ORAL
  Filled 2023-05-08 (×2): qty 1

## 2023-05-08 MED ORDER — ENSURE ENLIVE PO LIQD
237.0000 mL | Freq: Two times a day (BID) | ORAL | Status: DC
  Administered 2023-05-10 – 2023-05-28 (×24): 237 mL via ORAL
  Filled 2023-05-08: qty 237

## 2023-05-08 MED ORDER — ALUM & MAG HYDROXIDE-SIMETH 200-200-20 MG/5ML PO SUSP
30.0000 mL | Freq: Once | ORAL | Status: AC
  Administered 2023-05-08: 30 mL via ORAL
  Filled 2023-05-08: qty 30

## 2023-05-08 MED ORDER — ACETAMINOPHEN 650 MG RE SUPP: 650.0000 mg | Freq: Four times a day (QID) | RECTAL | Status: DC | PRN

## 2023-05-08 MED ORDER — METRONIDAZOLE 500 MG/100ML IV SOLN
500.0000 mg | Freq: Once | INTRAVENOUS | Status: AC
  Administered 2023-05-08: 500 mg via INTRAVENOUS
  Filled 2023-05-08: qty 100

## 2023-05-08 MED ORDER — LACTATED RINGERS IV SOLN: INTRAVENOUS | Status: DC

## 2023-05-08 MED ORDER — MAGNESIUM SULFATE 2 GM/50ML IV SOLN
2.0000 g | Freq: Once | INTRAVENOUS | Status: AC
  Administered 2023-05-08: 2 g via INTRAVENOUS
  Filled 2023-05-08: qty 50

## 2023-05-08 MED ORDER — VANCOMYCIN HCL 1500 MG/300ML IV SOLN
1500.0000 mg | Freq: Once | INTRAVENOUS | Status: AC
  Administered 2023-05-08: 1500 mg via INTRAVENOUS
  Filled 2023-05-08: qty 300

## 2023-05-08 MED ORDER — VANCOMYCIN HCL IN DEXTROSE 1-5 GM/200ML-% IV SOLN
1000.0000 mg | INTRAVENOUS | Status: DC
  Administered 2023-05-09: 1000 mg via INTRAVENOUS
  Filled 2023-05-08: qty 200

## 2023-05-08 MED ORDER — LIDOCAINE 5 % EX PTCH
1.0000 | MEDICATED_PATCH | CUTANEOUS | Status: DC
  Administered 2023-05-08 – 2023-05-13 (×6): 1 via TRANSDERMAL
  Filled 2023-05-08 (×6): qty 1

## 2023-05-08 MED ORDER — VANCOMYCIN HCL IN DEXTROSE 1-5 GM/200ML-% IV SOLN: 1000.0000 mg | Freq: Once | INTRAVENOUS | Status: DC

## 2023-05-08 MED ORDER — GABAPENTIN 300 MG PO CAPS
300.0000 mg | ORAL_CAPSULE | Freq: Three times a day (TID) | ORAL | Status: DC
  Administered 2023-05-08 – 2023-05-13 (×18): 300 mg via ORAL
  Filled 2023-05-08 (×18): qty 1

## 2023-05-08 MED ORDER — HEPARIN SODIUM (PORCINE) 5000 UNIT/ML IJ SOLN
5000.0000 [IU] | Freq: Three times a day (TID) | INTRAMUSCULAR | Status: DC
  Administered 2023-05-10 – 2023-05-17 (×23): 5000 [IU] via SUBCUTANEOUS
  Filled 2023-05-08 (×23): qty 1

## 2023-05-08 MED ORDER — ONDANSETRON HCL 4 MG/2ML IJ SOLN
4.0000 mg | Freq: Four times a day (QID) | INTRAMUSCULAR | Status: DC | PRN
  Administered 2023-05-17 – 2023-05-23 (×2): 4 mg via INTRAVENOUS
  Filled 2023-05-08 (×2): qty 2

## 2023-05-08 MED ORDER — SODIUM CHLORIDE 0.9 % IV SOLN
2.0000 g | Freq: Once | INTRAVENOUS | Status: AC
  Administered 2023-05-08: 2 g via INTRAVENOUS
  Filled 2023-05-08: qty 12.5

## 2023-05-08 NOTE — Sepsis Progress Note (Signed)
Elink following for sepsis protocol. 

## 2023-05-08 NOTE — H&P (Addendum)
History and Physical    Patient: Madison Ho ZHY:[id number] DOB: May 28, 1951 DOA: 05/07/2023 DOS: the patient was seen and examined on 05/08/2023 PCP: Ignatius Specking, MD  Patient coming from: Home  Chief Complaint:  Chief Complaint  Patient presents with   Arm Pain   Neck Pain   HPI: Madison Ho is a 72 y.o. female with medical history significant of hypertension, hyperlipidemia, diabetes mellitus type 2 with neuropathy, and obesity who presented with complaints of left arm and shoulder pain.  History is obtained from the patient with the assistance of her daughter-in-law who is currently present at bedside.  She has been dealing with pain in her left shoulder for the last 6 to 8 weeks.  Patient makes note that she had noticed swelling in the left anterior chest, but thought it was due to the way she was holding her arm.  Patient had been being evaluated by orthopedics in [place].  She had followed up yesterday in office that shoulder and was told that it showed some nerve impingement.  However, the provider made note that patient was significantly ill-appearing from prior visits and wanted her to come to the hospital for further evaluation.  Daughter-in-law notes that over the last month or so patient appears to have lost 40 to 50 pounds.  Patient makes note that she has no appetite and does not feel like eating.  She was very unsteady on her feet and had asked for a bedside commode and the other day due to weakness.  She denies having any abdominal pain, shortness of breath, nausea, vomiting, or dysuria symptoms.  Patient states that she is unable to walk to the other room to use the bathroom.  Talk to the patient she makes note that she previously smoked more heavy for over 25 years, but rolled her own cigarettes.  She quit about 6 to 8 weeks ago and she was unable to get outside as she never smoking in the house.  In the emergency department patient was noted to be afebrile, with heart rate 76-1  06, respirations 14-33, blood pressures as low as 66/44 after IV fluid boluses labs currently maintained greater than 65.  Labs 10/7 noted WBC 14.7-> 12.4, hemoglobin 13.5->11.1, BUN 26, creatinine 1.43, anion gap 16, and lactic acid 3.3.  Chest x-ray noted a dense consolidation in the left upper lobe with mass effect resulting in deviation of the trachea on the right.  X-rays of the left shoulder noted no acute fracture of the shoulder with age-indeterminate left rib fractures and airspace opacities in the left lung.  Urinalysis was positive for large leukocytes, positive nitrates, rare bacteria, 6-10 RBC/hpf, and 11-20 WBCs.  Blood cultures were obtained.  Patient received 3.5 L of IV fluids, vancomycin, metronidazole,  and cefepime.  CT scan of the chest abdomen pelvis was obtained which noted a large left upper lobe mass right adrenal nodule most consistent with metastatic disease.   Review of Systems: As mentioned in the history of present illness. All other systems reviewed and are negative. Past Medical History:  Diagnosis Date   Diabetes mellitus without complication (HCC)    Encounter for hepatitis C screening test for low risk patient 08/30/2019   Encounter for imaging to assess osteoporosis 08/30/2019   Encounter for screening mammogram for malignant neoplasm of breast 08/30/2019   Hyperlipidemia    Hypertension    Type 2 diabetes mellitus with neurological complications (HCC) 08/30/2019   Past Surgical History:  Procedure Laterality Date  EYE SURGERY N/A    [employer] 11/10/2020   LEG SURGERY     TUBAL LIGATION     Social History:  reports that she has been smoking. She has never used smokeless tobacco. She reports current alcohol use. She reports that she does not use drugs.  Allergies  Allergen Reactions   Penicillins Swelling    Has taken cipro with no problems    Family History  Problem Relation Age of Onset   Stroke Mother    Heart attack Mother    Heart attack Father      Prior to Admission medications   Medication Sig Start Date End Date Taking? Authorizing Provider  ACCU-CHEK AVIVA PLUS test strip TEST BLOOD SUGAR EVERY DAY 08/10/20   Freddy Finner, NP  Accu-Chek Softclix Lancets lancets TEST BLOOD SUGAR EVERY DAY 01/30/22   Anabel Halon, MD  Alcohol Swabs (B-D SINGLE USE SWABS REGULAR) PADS USE AS DIRECTED EVERY DAY 08/10/20   Freddy Finner, NP  Blood Glucose Calibration (ACCU-CHEK AVIVA) SOLN USE AS DIRECTED WITH GLUCOMETER 08/10/20   Freddy Finner, NP  Blood Glucose Monitoring Suppl (ACCU-CHEK AVIVA PLUS) w/Device KIT  08/12/19   [provider]  FARXIGA 5 MG TABS tablet Take 1 tablet (5 mg total) by mouth daily. Patient not taking: Reported on 11/10/2020 02/04/20   Freddy Finner, NP  fluticasone [facility]) 50 MCG/ACT nasal spray USE 1 SPRAY INTO BOTH NOSTRILS 2 TIMES DAILY AS NEEDED FOR ALLERGIES OR RHINITIS 01/30/22   Anabel Halon, MD  gabapentin (NEURONTIN) 300 MG capsule TAKE 1 CAPSULE THREE TIMES DAILY 08/10/20   Freddy Finner, NP  glipiZIDE (GLUCOTROL XL) 5 MG 24 hr tablet TAKE 1 TABLET EVERY DAY AFTER BREAKFAST 08/10/20   Freddy Finner, NP  IBU 800 MG tablet Take 1 tablet (800 mg total) by mouth every 8 (eight) hours as needed for moderate pain. 05/27/20   Freddy Finner, NP  Lancets Misc. (ACCU-CHEK SOFTCLIX LANCET DEV) KIT USE AS DIRECTED  TO CHECK BLOOD SUGAR ONE TIME DAILY 08/10/20   Freddy Finner, NP  lidocaine (LIDODERM) 5 % Place 1 patch onto the skin daily. Remove & Discard patch within 12 hours or as directed by MD 03/09/23   Burgess Amor, PA-C  metFORMIN (GLUCOPHAGE) 500 MG tablet TAKE 1 TABLET WITH BREAKFAST, WITH LUNCH, AND WITH EVENING MEAL 01/24/21   Heather Roberts, NP  nystatin (MYCOSTATIN/NYSTOP) powder Apply 1 application topically 2 (two) times daily. Patient not taking: Reported on 11/10/2020 03/18/20   Freddy Finner, NP  pravastatin (PRAVACHOL) 40 MG tablet TAKE 1 TABLET EVERY DAY 08/10/20   Freddy Finner, NP   UNABLE TO FIND Accu-Chek Softclix Lacet Kit  Use as directed to check blood sugar once daily 01/13/20   Freddy Finner, NP  UNABLE TO FIND Accu-Chek Aviva Solution   Use as directed 01/13/20   Freddy Finner, NP    Physical Exam: Vitals:   05/08/23 0600 05/08/23 0630 05/08/23 0700 05/08/23 0730  BP: (!) 145/76 125/79 119/79 126/84  Pulse: 91 (!) 106 90 88  Resp: (!) 24 (!) 33 (!) 28 (!) 28  Temp:  98.6 F (37 C)    TempSrc:  Oral    SpO2: 95% 92% 97% 96%   Constitutional: Obese female who appears chronically ill. Eyes: PERRL, lids and conjunctivae normal ENMT: Mucous membranes are moist.  Fair dentition. Neck: normal, supple, no masses,   Respiratory: Decreased breath sounds noted in  the left upper lobe.  No significant appreciated at this time. Cardiovascular: Regular rate and rhythm, no murmurs / rubs / gallops.  No significant lower extremity edema.   2+ pedal pulses.  Swelling noted of the left upper chest wall. Abdomen: no tenderness, no masses palpated.   Bowel sounds positive.  Musculoskeletal: no clubbing / cyanosis. No joint deformity upper and lower extremities.  Decreased range of motion of the left shoulder.  No contractures. Normal muscle tone.  Skin: no rashes, lesions, ulcers. No induration Neurologic: CN 2-12 grossly intact.  Strength 5/5 in all 4.  Psychiatric: Normal judgment and insight. Alert and oriented x 3. Normal mood.   Data Reviewed:  Reviewed labs, imaging, and pertinent records as documented in HPI.  Assessment and Plan:  Possible sepsis secondary to urinary tract infection Acute.  Patient was noted to be febrile up to 100.9 F with tachycardia and tachypnea meeting SIRS criteria.  Urinalysis was positive for large leukocytes, positive nitrates, rare bacteria, 6-10 RBC/hpf, and 11-20 WBCs. Lactic acid was elevated initially at 3.3.  Blood cultures hadbeen obtained.  Patient had initially been given empiric antibiotics of vancomycin, metronidazole and  cefepime.  Could also be related to significant dehydration in regards to lactic acidosis. -Follow-up blood cultures -Add-on urine culture -Antibiotics adjusted to vancomycin and Rocephin IV.  Adjust antibiotics as medically appropriate -Repeat CBC tomorrow morning  Left upper lobe mass Patient presented with complaints of left shoulder pain.  Family note weight loss of 40 to 50 pounds over the last month or so.  CT scan of the chest abdomen pelvis revealed concern mass measuring 13 x 17 cm with right adrenal nodule concerning for metastatic disease.  Appears currently over the last couple of weeks patient has had limited mobility for which her functional status is poor at this time. -Admit to a medical telemetry -PCCM consulted to evaluate -IR consulted for possible need biopsy. -Will need to consult oncology once biopsy obtained.  Transient hypotension On admission blood pressures were noted to be as low as 66/44.  Blood pressures improved with IV fluids. -Goal MAP greater than 65 -Continue IV fluids given AKI  Acute kidney injury Creatinine elevated at 1.43 with BUN 26.  Baseline creatinine previously was within normal limits.  Urinalysis noted elevated specific gravity for which patient was dehydrated. -Continue IV fluids -Recheck kidney function in a.m.  Left shoulder pain Chronic.  Patient reports having 6 to 8 weeks of left shoulder pain for which she had been being followed by orthopedics in the outpatient setting.  Recently had MRI which noted nerve impingement.  Thought likely related with the mass of the left upper chest. -Continue gabapentin  Hypomagnesemia Acute.  Magnesium noted to be 1.3. -Give 2 g of magnesium sulfate IV -Continue to monitor and replace as needed  Diabetes mellitus type 2, without long-term use of insulin, with neuropathy On admission glucose was noted to be 112.  Home medication regimen includes metformin. -Hypoglycemic protocols -Hold  metformin -Check hemoglobin A1c -CBGs before every meal  Hyperlipidemia -Continue pravastatin  Compression fracture Patient noted to have a compression fracture of T9 that appeared to be chronic. -Lidocaine patch as needed  Debility Patient had been unable to get around without need of assistance here over the last several weeks.  Daughter-in-law makes note that patient does not appear to be adequately cared for at home and may need placement. -PT/OT to eval and treat -TOC consulted for possible need of placement  Tobacco abuse Patient  reports rolling her own cigarettes for over 25 years, but recently quit 6 to 8 weeks ago due to being unable to ambulate outside of her home to smoke.  Obesity BMI 36.69 kg/m  DVT prophylaxis: Lovenox Advance Care Planning:   Code Status: Full Code    Consults: PCCM  Family Communication: Daughter-in-law updated at bedside and son over the phone  Severity of Illness: The appropriate patient status for this patient is INPATIENT. Inpatient status is judged to be reasonable and necessary in order to provide the required intensity of service to ensure the patient's safety. The patient's presenting symptoms, physical exam findings, and initial radiographic and laboratory data in the context of their chronic comorbidities is felt to place them at high risk for further clinical deterioration. Furthermore, it is not anticipated that the patient will be medically stable for discharge from the hospital within 2 midnights of admission.   * I certify that at the point of admission it is my clinical judgment that the patient will require inpatient hospital care spanning beyond 2 midnights from the point of admission due to high intensity of service, high risk for further deterioration and high frequency of surveillance required.*  Author: Clydie Braun, MD 05/08/2023 8:16 AM  For on call review [email or web address].

## 2023-05-08 NOTE — Progress Notes (Signed)
Pharmacy Antibiotic Note  Madison Ho is a 72 y.o. female admitted on 05/07/2023 with AMS/sepsis.  Pharmacy has been consulted for Vancomycin dosing.  Plan: Vancomycin 1000 mg IV q24h     Temp (24hrs), Avg:97.7 F (36.5 C), Min:97.4 F (36.3 C), Max:98 F (36.7 C)  Recent Labs  Lab 05/07/23 2336 05/08/23 0026 05/08/23 0140 05/08/23 0143  WBC 14.7*  --   --   --   CREATININE 1.43*  --   --  1.30*  LATICACIDVEN  --  3.3* 1.9  --     CrCl cannot be calculated (Unknown ideal weight.).    Allergies  Allergen Reactions   Penicillins Swelling    Has taken cipro with no problems    Eddie Candle 05/08/2023 5:50 AM

## 2023-05-08 NOTE — ED Notes (Signed)
Pt assisted to bedside commode

## 2023-05-08 NOTE — Consult Note (Signed)
Chief Complaint: LUL lung mass. Request is for biopsy  Referring Physician(s): Dr. Madelyn Flavors  Supervising Physician: Mir, Mauri Reading  Patient Status: [facility] - In-pt  History of Present Illness: Madison Ho is a 72 y.o. female inpatient. Smoker. History of DM, diabetic neuropathy,  HTN, HLD, Presented to the ED at [facility] on 10.7.24 with arm pain and neck pain CX 2 months with recent weight loss and failure to thrive. Found to have a large left upper lobe lung mass. CT CAP from 10.8.24 reads A 2 x 3 cm lobulated mass in the anterior mediastinum likely contiguous with the left upper lobe mass or represent metastases. Team is requesting a LUL lung mass  biopsy for further evaluation of metastatic disease. Pulmonology is in agreement with the plan of care  Daughter at bedside. Patient sleepy and laying in bed,calm. Endorses fatigue Denies any fevers, headache, chest pain, SOB, cough, abdominal pain, nausea, vomiting or bleeding. Return precautions and treatment recommendations and follow-up discussed with the patient and her daughter. Both who are agreeable with the plan.     Past Medical History:  Diagnosis Date   Diabetes mellitus without complication (HCC)    Encounter for hepatitis C screening test for low risk patient 08/30/2019   Encounter for imaging to assess osteoporosis 08/30/2019   Encounter for screening mammogram for malignant neoplasm of breast 08/30/2019   Hyperlipidemia    Hypertension    Type 2 diabetes mellitus with neurological complications (HCC) 08/30/2019    Past Surgical History:  Procedure Laterality Date   EYE SURGERY N/A    [employer] 11/10/2020   LEG SURGERY     TUBAL LIGATION      Allergies: Penicillins  Medications: Prior to Admission medications   Medication Sig Start Date End Date Taking? Authorizing Provider  lidocaine (LIDODERM) 5 % Place 1 patch onto the skin daily. Remove & Discard patch within 12 hours or as directed by MD 03/09/23  Yes Idol,  Raynelle Fanning, PA-C  metFORMIN (GLUCOPHAGE) 500 MG tablet TAKE 1 TABLET WITH BREAKFAST, WITH LUNCH, AND WITH EVENING MEAL 01/24/21  Yes Heather Roberts, NP  ACCU-CHEK AVIVA PLUS test strip TEST BLOOD SUGAR EVERY DAY 08/10/20   Freddy Finner, NP  Accu-Chek Softclix Lancets lancets TEST BLOOD SUGAR EVERY DAY 01/30/22   Anabel Halon, MD  Alcohol Swabs (B-D SINGLE USE SWABS REGULAR) PADS USE AS DIRECTED EVERY DAY 08/10/20   Freddy Finner, NP  Blood Glucose Calibration (ACCU-CHEK AVIVA) SOLN USE AS DIRECTED WITH GLUCOMETER 08/10/20   Freddy Finner, NP  Blood Glucose Monitoring Suppl (ACCU-CHEK AVIVA PLUS) w/Device KIT  08/12/19   [provider]  Lancets Misc. (ACCU-CHEK SOFTCLIX LANCET DEV) KIT USE AS DIRECTED  TO CHECK BLOOD SUGAR ONE TIME DAILY 08/10/20   Freddy Finner, NP  UNABLE TO FIND Accu-Chek Softclix Lacet Kit  Use as directed to check blood sugar once daily 01/13/20   Freddy Finner, NP  UNABLE TO FIND Accu-Chek Aviva Solution   Use as directed 01/13/20   Freddy Finner, NP     Family History  Problem Relation Age of Onset   Stroke Mother    Heart attack Mother    Heart attack Father     Social History   Socioeconomic History   Marital status: Widowed    Spouse name: Not on file   Number of children: 2   Years of education: Not on file   Highest education level: Bachelor's degree (e.g., BA, AB, BS)  Occupational History   Not on file  Tobacco Use   Smoking status: Every Day   Smokeless tobacco: Never  Substance and Sexual Activity   Alcohol use: Yes    Comment: occas   Drug use: Never   Sexual activity: Not Currently  Other Topics Concern   Not on file  Social History Narrative   Lives with Tammy recently moved from [place] in nov 2020   Grandson and her son       Cats      Enjoy: crochet, play merge dragons phone apps game       Diet: loves seafood, eggs, veggies, chicken and sides, limited fast food and red meat   Caffeine: 2 pots of coffee daily, drink a lot of  sweet tea, (root beer)   Water: 3 cups daily at least       Wears seat belt   Smoke and carbon monoxide detectors    Does not drive   Social Determinants of Health   Financial Resource Strain: Low Risk  (11/10/2020)   Overall Financial Resource Strain (CARDIA)    Difficulty of Paying Living Expenses: Not hard at all  Food Insecurity: No Food Insecurity (05/08/2023)   Hunger Vital Sign    Worried About Running Out of Food in the Last Year: Never true    Ran Out of Food in the Last Year: Never true  Transportation Needs: No Transportation Needs (05/08/2023)   PRAPARE - Administrator, Civil Service (Medical): No    Lack of Transportation (Non-Medical): No  Physical Activity: Inactive (11/10/2020)   Exercise Vital Sign    Days of Exercise per Week: 0 days    Minutes of Exercise per Session: 0 min  Stress: No Stress Concern Present (11/10/2020)   [employer] of Occupational Health - Occupational Stress Questionnaire    Feeling of Stress : Not at all  Social Connections: Moderately Isolated (11/10/2020)   Social Connection and Isolation Panel [NHANES]    Frequency of Communication with Friends and Family: More than three times a week    Frequency of Social Gatherings with Friends and Family: More than three times a week    Attends Religious Services: 1 to 4 times per year    Active Member of [employer] or Organizations: No    Attends Banker Meetings: Never    Marital Status: Widowed     Review of Systems: A 12 point ROS discussed and pertinent positives are indicated in the HPI above.  All other systems are negative.  Review of Systems  Constitutional:  Positive for fatigue. Negative for fever.  HENT:  Negative for congestion.   Respiratory:  Negative for cough and shortness of breath.   Gastrointestinal:  Negative for abdominal pain, diarrhea, nausea and vomiting.    Vital Signs: BP 118/61 (BP Location: Right Arm)   Pulse 80   Temp 98 F (36.7 C)  (Oral)   Resp 18   Ht 5\' 5"  (1.651 m)   Wt 220 lb 7.4 oz (100 kg)   SpO2 100%   BMI 36.69 kg/m    Physical Exam Vitals and nursing note reviewed.  Constitutional:      Appearance: She is well-developed.  HENT:     Head: Normocephalic and atraumatic.  Eyes:     Conjunctiva/sclera: Conjunctivae normal.  Cardiovascular:     Rate and Rhythm: Normal rate and regular rhythm.     Heart sounds: Normal heart sounds.  Pulmonary:  Effort: Pulmonary effort is normal.     Breath sounds: Normal breath sounds.  Musculoskeletal:        General: Normal range of motion.     Cervical back: Normal range of motion.  Skin:    General: Skin is warm and dry.  Neurological:     General: No focal deficit present.     Mental Status: She is alert and oriented to person, place, and time.     Imaging: CT CHEST ABDOMEN PELVIS W CONTRAST  Result Date: 05/08/2023 CLINICAL DATA:  Left arm pain and weakness. Left upper lobe mass. Evaluate for metastatic disease. EXAM: CT CHEST, ABDOMEN, AND PELVIS WITH CONTRAST TECHNIQUE: Multidetector CT imaging of the chest, abdomen and pelvis was performed following the standard protocol during bolus administration of intravenous contrast. RADIATION DOSE REDUCTION: This exam was performed according to the departmental dose-optimization program which includes automated exposure control, adjustment of the mA and/or kV according to patient size and/or use of iterative reconstruction technique. CONTRAST:  75mL OMNIPAQUE IOHEXOL 350 MG/ML SOLN COMPARISON:  Chest radiograph dated 05/08/2023. FINDINGS: CT CHEST FINDINGS Cardiovascular: There is no cardiomegaly or pericardial effusion. Moderate calcified and noncalcified plaque of the thoracic aorta. No aneurysmal dilatation or dissection. The origins of the great vessels of the aortic arch appear patent. There is mass effect and compression of the left main pulmonary artery with high-grade narrowing of the vessel. The central  pulmonary arteries remain patent. Mediastinum/Nodes: The esophagus is grossly unremarkable. No mediastinal fluid collection. Lungs/Pleura: Large mass occupying the entire left upper lobe extending into the left hilum and mediastinum. The mass measures approximately 13 x 17 cm in greatest axial dimensions and 15 cm in craniocaudal length. The mass abuts the descending thoracic aorta and aortic arch. There is encasement of the left main pulmonary artery with mass effect and high-grade narrowing. The mass also abuts the distal trachea and encases the left mainstem bronchus. There is occlusion of the left upper lobe bronchus. Areas of lower attenuation with gas within the mass consistent with necrotic changes. There is invasion of the left chest wall with extension of the mass outside of the intercostal musculature into the left axilla and subpectoral tissues. A 2 x 3 cm lobulated mass in the anterior mediastinum likely contiguous with the left upper lobe mass or represent metastases. Musculoskeletal: There is infiltrative changes of the left 1st-4th ribs. Several old left lower rib fractures. Age indeterminate nondisplaced fracture of the posterior left eighth rib. There is left axillary adenopathy. There is compression of the left supraclinoid vein by the chest wall mass. Age indeterminate, likely chronic compression fracture of T9 with near complete loss of vertebral body height and anterior wedging. Additional old appearing compression fractures of T12 and L1. Correlation with clinical exam and point tenderness recommended. CT ABDOMEN PELVIS FINDINGS No intra-abdominal free air or free fluid. Hepatobiliary: The liver is unremarkable. No biliary dilatation. The gallbladder is unremarkable. Pancreas: Unremarkable. No pancreatic ductal dilatation or surrounding inflammatory changes. Spleen: Normal in size without focal abnormality. Adrenals/Urinary Tract: A 3.2 x 2.2 cm right adrenal nodule most consistent with  metastatic disease. Indeterminate 1 cm left adrenal nodule. There is no hydronephrosis on either side. There is symmetric enhancement and excretion of contrast by both kidneys. The visualized ureters and urinary bladder appear unremarkable. Stomach/Bowel: There is sigmoid diverticulosis with muscular hypertrophy. No active inflammatory changes. There is loose stool in the proximal colon. No bowel obstruction or active inflammation. The appendix is normal. Vascular/Lymphatic: Advanced calcified  and noncalcified plaque of the abdominal aorta. The aorta is ectatic measuring 2.5 cm in diameter. The IVC is unremarkable. No portal venous gas. No adenopathy. Reproductive: The uterus is grossly unremarkable. No adnexal masses. Other: None Musculoskeletal: Osteopenia. Chronic appearing L1 compression fracture. IMPRESSION: 1. Large left upper lobe mass as above. Multidisciplinary consult is advised. 2. Right adrenal nodule most consistent with metastatic disease. 3. Age indeterminate, likely chronic compression fracture of T9. Correlation with clinical exam and point tenderness recommended. 4. Sigmoid diverticulosis. No bowel obstruction. Normal appendix. 5.  Aortic Atherosclerosis (ICD10-I70.0). Electronically Signed   By: Elgie Collard M.D.   On: 05/08/2023 03:07   DG Shoulder Left  Result Date: 05/08/2023 CLINICAL DATA:  Worsening shoulder pain EXAM: LEFT SHOULDER - 2+ VIEW COMPARISON:  Radiographs 03/09/2023 FINDINGS: No acute fracture or dislocation of the left shoulder. Age indeterminate left rib fractures. Airspace opacities in the left lung, see separate details. IMPRESSION: 1. No acute fracture or dislocation of the left shoulder. 2. Age indeterminate left rib fractures. 3. Airspace opacities in the left lung, see separate details. Electronically Signed   By: Minerva Fester M.D.   On: 05/08/2023 00:49   DG Chest 2 View  Result Date: 05/08/2023 CLINICAL DATA:  Weakness EXAM: CHEST - 2 VIEW COMPARISON:   None Available. FINDINGS: Dense consolidation within the a left upper lobe with mass effect resulting in deviation of the trachea to the right. Right lung is clear. No pneumothorax or pleural effusion. Cardiac size is within normal limits. IMPRESSION: 1. Dense consolidation within the left upper lobe with mass effect resulting in deviation of the trachea to the right. While this may reflect pneumonia, underlying neoplasm is not excluded. CT examination is recommended for further evaluation. Electronically Signed   By: Helyn Numbers M.D.   On: 05/08/2023 00:46    Labs:  CBC: Recent Labs    05/07/23 2336 05/08/23 0143 05/08/23 0649  WBC 14.7*  --  12.4*  HGB 13.5 13.6 11.1*  HCT 43.0 40.0 34.5*  PLT 431*  --  380    COAGS: No results for input(s): "INR", "APTT" in the last 8760 hours.  BMP: Recent Labs    05/07/23 2336 05/08/23 0143 05/08/23 0859  NA 136 132* 132*  K 4.1 6.8* 4.0  CL 103 105 99  CO2 17*  --  19*  GLUCOSE 112* 96 76  BUN 26* 42* 21  CALCIUM 9.4  --  8.5*  CREATININE 1.43* 1.30* 1.02*  GFRNONAA 39*  --  58*    LIVER FUNCTION TESTS: Recent Labs    05/07/23 2336 05/08/23 0859  BILITOT 0.7 0.8  AST 24 21  ALT 12 10  ALKPHOS 55 40  PROT 7.8 5.9*  ALBUMIN 2.8* 2.1*      Assessment and Plan:  72 y.o. female inpatient. Smoker. History of DM, diabetic neuropathy,  HTN, HLD, Presented to the ED at [facility] on 10.7.24 with arm pain and neck pain CX 2 months with recent weight loss and failure to thrive. Found to have a large left upper lobe lung mass. CT CAP from 10.8.24 reads A 2 x 3 cm lobulated mass in the anterior mediastinum likely contiguous with the left upper lobe mass or represent metastases. Team is requesting a LUL lung mass  biopsy for further evaluation of metastatic disease. Pulmonology is in agreement with the plan of care  Magnesium 1.3, albumin 2.1, total protein 5.9, WBC 12.4. INR pending. Patient is on subcutaneous prophylactic dose of heparin  listed as future order. Message sent to pharmacy requesting that the medication be held until 10.10.24  All other labs and medications are within acceptable parameters. No pertinent allergies.   IR consulted for possible LUL. Case has been reviewed and procedure approved by Dr. Carmon Ginsberg. Mir.  Patient tentatively scheduled for 10.9.24.  Team instructed to: Keep Patient to be NPO after midnight Hold prophylactic anticoagulation 24 hours prior to scheduled procedure.  IR will call patient when ready.  Risks and benefits of LUL lung biopsy discussed with the Patient' s daughter  including, but not limited to bleeding, infection, damage to adjacent structures or low yield requiring additional tests.  All of the questions were answered and there is agreement to proceed.  Consent signed and in IR consent bx   Thank you for this interesting consult.  I greatly enjoyed meeting Anzley Dibbern and look forward to participating in their care.  A copy of this report was sent to the requesting provider on this date.  Electronically Signed: Alene Mires, NP 05/08/2023, 4:10 PM   I spent a total of 40 Minutes    in face to face in clinical consultation, greater than 50% of which was counseling/coordinating care for LUl lung biopsy

## 2023-05-08 NOTE — Progress Notes (Signed)
ED Pharmacy Antibiotic Sign Off An antibiotic consult was received from an ED provider for Vancomycin  per pharmacy dosing for sepsis. A chart review was completed to assess appropriateness.   The following one time order(s) were placed:  Vancomycin 1500 mg IV  Further antibiotic and/or antibiotic pharmacy consults should be ordered by the admitting provider if indicated.   Thank you for allowing pharmacy to be a part of this patient's care.   Eddie Candle, [facility]  Clinical Pharmacist 05/08/23 5:47 AM

## 2023-05-08 NOTE — Progress Notes (Signed)
   IR Note  Request made for IR to perform LUL mass biopsy Dr Bryn Gulling has reviewed imaging and approves procedure BUT.... Need PCCM to consult first.  MD aware and will ask for consult  If PCCM approves Bx we will move ahead - possible 10/9 If PCCM does not approves--- we will cancel procedure request.  MD aware and agreeable

## 2023-05-08 NOTE — Consult Note (Addendum)
NAME:  Madison Ho, MRN:  [id number], DOB:  April 30, 1951, LOS: 0 ADMISSION DATE:  05/07/2023, CONSULTATION DATE:  05/08/23 REFERRING MD:  05/08/23, CHIEF COMPLAINT:  abnormal CT chest    History of Present Illness:  72 yo F PMH DM2, prior L rotator cuff injury with some residual neuropathy, HTN, HLD, who presented to ED 10/7 w L arm and shoulder pain. Ongoing > 2 months, had been to ED previously and pain was tx after L shoulder Xray obtained (03/09/2023) which didn't show acute findings. Also has seen ortho (most recently 10/7) who felt she had nerve impingement (possibly had an MRI but I cannot see this), but also felt she looked significantly ill overall compared to the past visits and recommended she present to ED.   In ED sh was initially hypotensive but this normalized. C/o significant pain. A CT chest was obtained which shows A large 13x17cmLUL mass abutting descending thoracic aorta, encasing the L main pulm artery + associated mass effect and stenosis, occlusion of LUL bronchus as well as  a 2x3cm anterior mediastinal mass (possibly contiguous w LUL mass), R 3.2 x 2.2 cm adrenal nodule.   History from family w associated  significant unintentional wt  loss (40lbs or so in 1-68mo) decr appetite, lethargy, weakness, fatigue, feelings of left chest swelling. Hx tobacco use; heavy smoker for >25 years or so + smoking of some degree since age 10, and quit about 8 weeks ago because she was too weak to go outside to smoke.   Admitted to [facility] -- possible UTI, AKI, uncontrolled pain and abnormal CT    PCCM is consulted in this setting   Pertinent  Medical History   DM2 L rotator cuff injury Neuropathy HLD HTN  Significant Hospital Events: Including procedures, antibiotic start and stop dates in addition to other pertinent events   10/7 to ED 10/8 admit to [facility], large LUL mass, mediastinal mass, adrenal nodule -- cf met dz  Interim History / Subjective:  Vomited on the floor   Pain is  better after oxy  Objective   Blood pressure 118/61, pulse 80, temperature 98 F (36.7 C), temperature source Oral, resp. rate 18, height 5\' 5"  (1.651 m), weight 100 kg, SpO2 100%.        Intake/Output Summary (Last 24 hours) at 05/08/2023 1246 Last data filed at 05/08/2023 1036 Gross per 24 hour  Intake 3593.69 ml  Output 500 ml  Net 3093.69 ml   Filed Weights   05/08/23 0837  Weight: 100 kg    Examination: General: chronically and acutely ill, laying on her R side  HENT: NCAT pink mm  Lungs: symmetrical chest expansion  Chest: Does feel like there is some L chest wall protrusion compared to R.  Abdomen: round Extremities: no acute joint deformity  Neuro: AAOx3 GU: defer   Resolved Hospital Problem list     Assessment & Plan:   Large LUL mass Anterior mediastinal mass R adrenal nodule  (The above suspected as being stg IV metastatic dz)  Pain r/t above  Physical deconditioning  Long standing tobacco use  Code status discussion, GOC discussion -CT w large 13x17cmLUL mass abutting descending thoracic aorta, encasing the L main pulm artery + associated mass effect and stenosis, occlusion of LUL bronchus as well as  a 2x3cm anterior mediastinal mass (possibly contiguous w LUL mass), R 3.2 x 2.2 cm adrenal nodule.  -think this is a bit overwhelming for her. Some deflecting, minimization. Lots of comments like "welp everybody  dies from something" etc  P - talked with her about what next steps would be for pursuing diagnosis and exploring tx options (bronch or IR bx). This was a bit daunting to hear about and she is thinking about it  - think reaching out to rad onc early might be a good idea to see if rad will ofter some sx relief etc  -talked also about code status -- this was not a term she had heard before. Talked about Full code vs DNR, what resuscitation entails etc. She is contemplating this.  -PRN analgesia  -she is full code at present, cont to f/u about this   -PT/OT     Best Practice (right click and "Reselect all SmartList Selections" daily)   Per primary   Labs   CBC: Recent Labs  Lab 05/07/23 2336 05/08/23 0143 05/08/23 0649  WBC 14.7*  --  12.4*  NEUTROABS 12.1*  --  9.2*  HGB 13.5 13.6 11.1*  HCT 43.0 40.0 34.5*  MCV 94.3  --  94.3  PLT 431*  --  380    Basic Metabolic Panel: Recent Labs  Lab 05/07/23 2336 05/08/23 0143 05/08/23 0859  NA 136 132* 132*  K 4.1 6.8* 4.0  CL 103 105 99  CO2 17*  --  19*  GLUCOSE 112* 96 76  BUN 26* 42* 21  CREATININE 1.43* 1.30* 1.02*  CALCIUM 9.4  --  8.5*  MG  --   --  1.3*   GFR: Estimated Creatinine Clearance: 57.5 mL/min (A) (by C-G formula based on SCr of 1.02 mg/dL (H)). Recent Labs  Lab 05/07/23 2336 05/08/23 0026 05/08/23 0140 05/08/23 0649 05/08/23 0859  PROCALCITON  --   --   --   --  2.73  WBC 14.7*  --   --  12.4*  --   LATICACIDVEN  --  3.3* 1.9  --   --     Liver Function Tests: Recent Labs  Lab 05/07/23 2336 05/08/23 0859  AST 24 21  ALT 12 10  ALKPHOS 55 40  BILITOT 0.7 0.8  PROT 7.8 5.9*  ALBUMIN 2.8* 2.1*   No results for input(s): "LIPASE", "AMYLASE" in the last 168 hours. No results for input(s): "AMMONIA" in the last 168 hours.  ABG    Component Value Date/Time   TCO2 20 (L) 05/08/2023 0143     Coagulation Profile: No results for input(s): "INR", "PROTIME" in the last 168 hours.  Cardiac Enzymes: No results for input(s): "CKTOTAL", "CKMB", "CKMBINDEX", "TROPONINI" in the last 168 hours.  HbA1C: Hgb A1c MFr Bld  Date/Time Value Ref Range Status  09/05/2019 10:30 AM 10.7 (H) <5.7 % of total Hgb Final    Comment:    For someone without known diabetes, a hemoglobin A1c value of 6.5% or greater indicates that they may have  diabetes and this should be confirmed with a follow-up  test. . For someone with known diabetes, a value <7% indicates  that their diabetes is well controlled and a value  greater than or equal to 7%  indicates suboptimal  control. A1c targets should be individualized based on  duration of diabetes, age, comorbid conditions, and  other considerations. . Currently, no consensus exists regarding use of hemoglobin A1c for diagnosis of diabetes for children. .     CBG: Recent Labs  Lab 05/08/23 0323  GLUCAP 84    Review of Systems:   Review of Systems  Constitutional:  Positive for malaise/fatigue and weight loss.  HENT: Negative.    Respiratory: Negative.    Gastrointestinal:  Positive for vomiting.       Decreased appetite  Musculoskeletal:  Positive for joint pain.  Skin: Negative.   Neurological:  Positive for tingling.     Past Medical History:  She,  has a past medical history of Diabetes mellitus without complication (HCC), Encounter for hepatitis C screening test for low risk patient (08/30/2019), Encounter for imaging to assess osteoporosis (08/30/2019), Encounter for screening mammogram for malignant neoplasm of breast (08/30/2019), Hyperlipidemia, Hypertension, and Type 2 diabetes mellitus with neurological complications (HCC) (08/30/2019).   Surgical History:   Past Surgical History:  Procedure Laterality Date   EYE SURGERY N/A    [employer] 11/10/2020   LEG SURGERY     TUBAL LIGATION       Social History:   reports that she has been smoking. She has never used smokeless tobacco. She reports current alcohol use. She reports that she does not use drugs.   Family History:  Her family history includes Heart attack in her father and mother; Stroke in her mother.   Allergies Allergies  Allergen Reactions   Penicillins Swelling    Has taken cipro with no problems     Home Medications  Prior to Admission medications   Medication Sig Start Date End Date Taking? Authorizing Provider  lidocaine (LIDODERM) 5 % Place 1 patch onto the skin daily. Remove & Discard patch within 12 hours or as directed by MD 03/09/23  Yes Idol, Raynelle Fanning, PA-C  metFORMIN (GLUCOPHAGE) 500  MG tablet TAKE 1 TABLET WITH BREAKFAST, WITH LUNCH, AND WITH EVENING MEAL 01/24/21  Yes Heather Roberts, NP  ACCU-CHEK AVIVA PLUS test strip TEST BLOOD SUGAR EVERY DAY 08/10/20   Freddy Finner, NP  Accu-Chek Softclix Lancets lancets TEST BLOOD SUGAR EVERY DAY 01/30/22   Anabel Halon, MD  Alcohol Swabs (B-D SINGLE USE SWABS REGULAR) PADS USE AS DIRECTED EVERY DAY 08/10/20   Freddy Finner, NP  Blood Glucose Calibration (ACCU-CHEK AVIVA) SOLN USE AS DIRECTED WITH GLUCOMETER 08/10/20   Freddy Finner, NP  Blood Glucose Monitoring Suppl (ACCU-CHEK AVIVA PLUS) w/Device KIT  08/12/19   [provider]  Lancets Misc. (ACCU-CHEK SOFTCLIX LANCET DEV) KIT USE AS DIRECTED  TO CHECK BLOOD SUGAR ONE TIME DAILY 08/10/20   Freddy Finner, NP  UNABLE TO FIND Accu-Chek Softclix Lacet Kit  Use as directed to check blood sugar once daily 01/13/20   Freddy Finner, NP  UNABLE TO FIND Accu-Chek Aviva Solution   Use as directed 01/13/20   Freddy Finner, NP     Critical care time: na       High MDM   Tessie Fass MSN, AGACNP-BC [facility] Pulmonary/Critical Care Medicine Amion for pager  05/08/2023, 2:23 PM

## 2023-05-08 NOTE — ED Notes (Signed)
Per Steward Drone in lab, labs are grossly hemolyzed. RN to redraw labs.

## 2023-05-09 ENCOUNTER — Inpatient Hospital Stay (HOSPITAL_COMMUNITY): Payer: Medicare PPO

## 2023-05-09 DIAGNOSIS — R918 Other nonspecific abnormal finding of lung field: Secondary | ICD-10-CM | POA: Diagnosis not present

## 2023-05-09 DIAGNOSIS — A419 Sepsis, unspecified organism: Secondary | ICD-10-CM | POA: Diagnosis not present

## 2023-05-09 DIAGNOSIS — J9859 Other diseases of mediastinum, not elsewhere classified: Secondary | ICD-10-CM

## 2023-05-09 DIAGNOSIS — Z7189 Other specified counseling: Secondary | ICD-10-CM

## 2023-05-09 DIAGNOSIS — N3000 Acute cystitis without hematuria: Secondary | ICD-10-CM | POA: Diagnosis not present

## 2023-05-09 DIAGNOSIS — N179 Acute kidney failure, unspecified: Secondary | ICD-10-CM | POA: Diagnosis not present

## 2023-05-09 LAB — PROTIME-INR
INR: 1.2 (ref 0.8–1.2)
Prothrombin Time: 15.2 s (ref 11.4–15.2)

## 2023-05-09 LAB — CBG MONITORING, ED: Glucose-Capillary: 94 mg/dL (ref 70–99)

## 2023-05-09 LAB — GLUCOSE, CAPILLARY
Glucose-Capillary: 92 mg/dL (ref 70–99)
Glucose-Capillary: 153 mg/dL — ABNORMAL HIGH (ref 70–99)
Glucose-Capillary: 173 mg/dL — ABNORMAL HIGH (ref 70–99)

## 2023-05-09 MED ORDER — FENTANYL CITRATE (PF) 100 MCG/2ML IJ SOLN
INTRAMUSCULAR | Status: AC | PRN
Start: 2023-05-09 — End: 2023-05-09
  Administered 2023-05-09 (×3): 25 ug via INTRAVENOUS

## 2023-05-09 MED ORDER — MIDAZOLAM HCL 2 MG/2ML IJ SOLN
INTRAMUSCULAR | Status: AC
  Filled 2023-05-09: qty 2

## 2023-05-09 MED ORDER — FENTANYL CITRATE (PF) 100 MCG/2ML IJ SOLN
INTRAMUSCULAR | Status: AC
  Filled 2023-05-09: qty 2

## 2023-05-09 MED ORDER — MIDAZOLAM HCL 2 MG/2ML IJ SOLN
INTRAMUSCULAR | Status: AC | PRN
Start: 2023-05-09 — End: 2023-05-09
  Administered 2023-05-09: .5 mg via INTRAVENOUS
  Administered 2023-05-09: 1 mg via INTRAVENOUS

## 2023-05-09 MED ORDER — LIDOCAINE HCL 1 % IJ SOLN
10.0000 mL | Freq: Once | INTRAMUSCULAR | Status: AC
  Administered 2023-05-09: 10 mL

## 2023-05-09 NOTE — Progress Notes (Signed)
OT Cancellation Note  Patient Details Name: Madison Ho MRN: [id number] DOB: 12-29-1950   Cancelled Treatment:    Reason Eval/Treat Not Completed: Patient at procedure or test/ unavailable Patient being taken to IR upon OT entry. OT will follow back to complete evaluation as time permits.   Pollyann Glen E. Hailee Hollick, OTR/L Acute Rehabilitation Services [phone]   Cherlyn Cushing 05/09/2023, 10:15 AM

## 2023-05-09 NOTE — Evaluation (Signed)
Occupational Therapy Evaluation Patient Details Name: Madison Ho MRN: [id number] DOB: 02/11/1951 Today's Date: 05/09/2023   History of Present Illness Patient is 72 y.o. female [facility] w/ arm and neck pain for ~2 months with recent weight loss and FTT. Pt found to have a large Lt upper lobe lung mass and lobulated mass in the anterior mediastinum. PMH significant for DM, diabetic neuropathy,  HTN, HLD.   Clinical Impression   Prior to this admission, patient living with her daughter, and uses a rollator for her mobility. Patient does not shower, completes bird baths at baseline, and requires assist for lower body dressing. Patient typically walks a few feet and then has her grandson push her down the hallway while seated on her rollator. Patient presenting with generalized weakness, poor activity tolerance and endurance, and need for increased assist in order to complete ADLs and transfers. Patient min A for bed mobility, ADLs and functional mobility. OT recommending HHOT at discharge; will continue to follow.      If plan is discharge home, recommend the following: A lot of help with walking and/or transfers;A lot of help with bathing/dressing/bathroom;Assistance with cooking/housework;Direct supervision/assist for medications management;Direct supervision/assist for financial management;Assist for transportation;Help with stairs or ramp for entrance    Functional Status Assessment  Patient has had a recent decline in their functional status and demonstrates the ability to make significant improvements in function in a reasonable and predictable amount of time.  Equipment Recommendations  None recommended by OT (Patient has DME needed)    Recommendations for Other Services       Precautions / Restrictions Precautions Precautions: Fall Restrictions Weight Bearing Restrictions: No      Mobility Bed Mobility Overal bed mobility: Needs Assistance Bed  Mobility: Supine to Sit, Sit to Supine     Supine to sit: Min assist Sit to supine: Min assist   General bed mobility comments: poor balance seated EOB, requiring min A to correct, requiring assist to bring BLEs back into bed    Transfers Overall transfer level: Needs assistance Equipment used: 1 person hand held assist Transfers: Sit to/from Stand Sit to Stand: Min assist           General transfer comment: Min A to come into standing, poor upright posture, did not advance past taking a few steps to the [facility]      Balance Overall balance assessment: Needs assistance Sitting-balance support: Bilateral upper extremity supported, Feet supported Sitting balance-Leahy Scale: Poor Sitting balance - Comments: did not tolerate challenge well Postural control: Right lateral lean Standing balance support: Bilateral upper extremity supported, During functional activity, Reliant on assistive device for balance Standing balance-Leahy Scale: Poor Standing balance comment: reliant on external assist                           ADL either performed or assessed with clinical judgement   ADL Overall ADL's : Needs assistance/impaired Eating/Feeding: Set up;Sitting   Grooming: Set up;Sitting   Upper Body Bathing: Contact guard assist;Sitting   Lower Body Bathing: Moderate assistance;Sitting/lateral leans;Sit to/from stand   Upper Body Dressing : Contact guard assist;Sitting   Lower Body Dressing: Moderate assistance;Sitting/lateral leans;Sit to/from stand   Toilet Transfer: Minimal assistance;BSC/3in1 Statistician Details (indicate cue type and reason): simulated Toileting- Clothing Manipulation and Hygiene: Moderate assistance;Sitting/lateral lean;Sit to/from stand       Functional mobility during ADLs: Minimal assistance;Cueing for sequencing;Cueing for safety;Rolling walker (2 wheels) General  ADL Comments: Patient presenting with generalized weakness, poor activity  tolerance and endurance, and need for increased assist in order to complete ADLs and transfers. OT recommending HHOT at discharge; will continue to follow.     Vision Baseline Vision/History: 1 Wears glasses Ability to See in Adequate Light: 0 Adequate Patient Visual Report: No change from baseline Vision Assessment?: No apparent visual deficits     Perception Perception: Not tested       Praxis Praxis: Not tested       Pertinent Vitals/Pain Pain Assessment Pain Assessment: Faces Faces Pain Scale: Hurts a little bit Pain Location: generalized with movement Pain Descriptors / Indicators: Grimacing, Guarding, Discomfort Pain Intervention(s): Limited activity within patient's tolerance, Monitored during session, Repositioned     Extremity/Trunk Assessment Upper Extremity Assessment Upper Extremity Assessment: Generalized weakness   Lower Extremity Assessment Lower Extremity Assessment: Defer to PT evaluation;Generalized weakness   Cervical / Trunk Assessment Cervical / Trunk Assessment: Kyphotic   Communication Communication Communication: No apparent difficulties Cueing Techniques: Verbal cues;Tactile cues   Cognition Arousal: Alert Behavior During Therapy: WFL for tasks assessed/performed Overall Cognitive Status: Within Functional Limits for tasks assessed                                 General Comments: Oriented, but sometimes with some "off the cuff" responses     General Comments  VSS on RA    Exercises     Shoulder Instructions      Home Living Family/patient expects to be discharged to:: Private residence Living Arrangements: Children;Other relatives Available Help at Discharge: Family;Available 24 hours/day Type of Home: House Home Access: Stairs to enter [employer] of Steps: 7 Entrance Stairs-Rails: Right Home Layout: One level     Bathroom Shower/Tub: Chief Strategy Officer: Standard     Home  Equipment: Rollator (4 wheels);Cane - quad;Cane - single point;BSC/3in1          Prior Functioning/Environment Prior Level of Function : Needs assist       Physical Assist : Mobility (physical);ADLs (physical) Mobility (physical): Transfers;Gait;Stairs ADLs (physical): Bathing;Dressing;Toileting;IADLs Mobility Comments: does not ambulate much, will take a few stpes with the rollator and then her grandsom pushes her down the hallway ADLs Comments: patient bird bathes at baseline, requires assist for her lower body dressing        OT Problem List: Decreased strength;Decreased range of motion;Decreased activity tolerance;Impaired balance (sitting and/or standing);Decreased coordination;Decreased safety awareness;Decreased knowledge of use of DME or AE;Increased edema      OT Treatment/Interventions: Self-care/ADL training;Therapeutic exercise;Energy conservation;DME and/or AE instruction;Manual therapy;Therapeutic activities;Patient/family education;Balance training    OT Goals(Current goals can be found in the care plan section) Acute Rehab OT Goals Patient Stated Goal: to get better OT Goal Formulation: With patient Time For Goal Achievement: 05/23/23 Potential to Achieve Goals: Fair  OT Frequency: Min 1X/week    Co-evaluation              AM-PAC OT "6 Clicks" Daily Activity     Outcome Measure Help from another person eating meals?: A Little Help from another person taking care of personal grooming?: A Little Help from another person toileting, which includes using toliet, bedpan, or urinal?: A Lot Help from another person bathing (including washing, rinsing, drying)?: A Lot Help from another person to put on and taking off regular upper body clothing?: A Little Help from another person to put on and  taking off regular lower body clothing?: A Lot 6 Click Score: 15   End of Session Equipment Utilized During Treatment: Gait belt Nurse Communication: Mobility  status  Activity Tolerance: Patient tolerated treatment well Patient left: in bed;with call bell/phone within reach;with bed alarm set;with family/visitor present  OT Visit Diagnosis: Unsteadiness on feet (R26.81);Other abnormalities of gait and mobility (R26.89);Muscle weakness (generalized) (M62.81);Adult, failure to thrive (R62.7)                Time: 2841-3244 OT Time Calculation (min): 17 min Charges:  OT General Charges $OT Visit: 1 Visit OT Evaluation $OT Eval Moderate Complexity: 1 Mod  Pollyann Glen E. Yarexi Pawlicki, OTR/L Acute Rehabilitation Services [phone]   Cherlyn Cushing 05/09/2023, 3:32 PM

## 2023-05-09 NOTE — ED Notes (Signed)
Patient currently in IR. ?

## 2023-05-09 NOTE — Progress Notes (Signed)
PT Cancellation Note  Patient Details Name: Travis Purk MRN: [id number] DOB: [birth year]   Cancelled Treatment:    Reason Eval/Treat Not Completed: Patient at procedure or test/unavailable (Pt at IR for lung biopsy. Will follow up at later date/time as schedule allows and pt able.)   Renaldo Fiddler PT, DPT Acute Rehabilitation Services Office [phone]  05/09/23 10:27 AM

## 2023-05-09 NOTE — ED Notes (Signed)
ED TO INPATIENT HANDOFF REPORT  ED Nurse Name and Phone #:   S Name/Age/Gender Madison Ho 72 y.o. female Room/Bed: 028C/028C  Code Status   Code Status: Full Code  Home/SNF/Other Home Patient oriented to: self, place, time, and situation Is this baseline? Yes   Triage Complete: Triage complete  Chief Complaint AKI (acute kidney injury) (HCC) [N17.9]  Triage Note Pt to ED with complaint of left arm pain x2 months. Seen by OSH ED at that time and given lidocaine patch and tylenol. Seen by ortho for same as well - had MRI that showed pinched nerves in back. Daughter reports patient has continuously deteriorated over last 2 months. Unable to get out of bed. Reports increased fatigue. Pale and diaphoretic in triage.    Allergies Allergies  Allergen Reactions   Penicillins Swelling    Has taken cipro with no problems    Level of Care/Admitting Diagnosis ED Disposition     ED Disposition  Admit   Condition  --   Comment  Hospital Area: [facility] [[id number]]  Level of Care: Telemetry Medical [[id number]]  May admit patient to [facility] or [facility] if equivalent level of care is available:: No  Covid Evaluation: Symptomatic Person Under Investigation (PUI) or recent exposure (last 10 days) *Testing Required*  Diagnosis: AKI (acute kidney injury) [facility]) [[id number]]  Admitting Physician: Lilyan Gilford [[id number]]  Attending Physician: Lilyan Gilford [[id number]]  Certification:: I certify this patient will need inpatient services for at least 2 midnights  Expected Medical Readiness: 05/10/2023          B Medical/Surgery History Past Medical History:  Diagnosis Date   Diabetes mellitus without complication (HCC)    Encounter for hepatitis C screening test for low risk patient 08/30/2019   Encounter for imaging to assess osteoporosis 08/30/2019   Encounter for screening mammogram for malignant neoplasm of breast 08/30/2019   Hyperlipidemia     Hypertension    Type 2 diabetes mellitus with neurological complications (HCC) 08/30/2019   Past Surgical History:  Procedure Laterality Date   EYE SURGERY N/A    [employer] 11/10/2020   LEG SURGERY     TUBAL LIGATION       A IV Location/Drains/Wounds Patient Lines/Drains/Airways Status     Active Line/Drains/Airways     Name Placement date Placement time Site Days   Peripheral IV 05/08/23 20 G Left;Upper Antecubital 05/08/23  0054  Antecubital  1   Peripheral IV 05/08/23 22 G Anterior;Right;Upper Arm 05/08/23  1050  Arm  1            Intake/Output Last 24 hours  Intake/Output Summary (Last 24 hours) at 05/09/2023 1029 Last data filed at 05/08/2023 1036 Gross per 24 hour  Intake --  Output 500 ml  Net -500 ml    Labs/Imaging Results for orders placed or performed during the hospital encounter of 05/07/23 (from the past 48 hour(s))  Comprehensive metabolic panel     Status: Abnormal   Collection Time: 05/07/23 11:36 PM  Result Value Ref Range   Sodium 136 135 - 145 mmol/L   Potassium 4.1 3.5 - 5.1 mmol/L   Chloride 103 98 - 111 mmol/L   CO2 17 (L) 22 - 32 mmol/L   Glucose, Bld 112 (H) 70 - 99 mg/dL    Comment: Glucose reference range applies only to samples taken after fasting for at least 8 hours.   BUN 26 (H) 8 - 23 mg/dL   Creatinine, Ser 2.95 (  H) 0.44 - 1.00 mg/dL   Calcium 9.4 8.9 - 57.8 mg/dL   Total Protein 7.8 6.5 - 8.1 g/dL   Albumin 2.8 (L) 3.5 - 5.0 g/dL   AST 24 15 - 41 U/L   ALT 12 0 - 44 U/L   Alkaline Phosphatase 55 38 - 126 U/L   Total Bilirubin 0.7 0.3 - 1.2 mg/dL   GFR, Estimated 39 (L) >60 mL/min    Comment: (NOTE) Calculated using the CKD-EPI Creatinine Equation (2021)    Anion gap 16 (H) 5 - 15    Comment: Performed at [facility], [address] [place], [place] [address]  CBC with Differential     Status: Abnormal   Collection Time: 05/07/23 11:36 PM  Result Value Ref Range   WBC 14.7 (H) 4.0 - 10.5 K/uL   RBC 4.56 3.87 -  5.11 MIL/uL   Hemoglobin 13.5 12.0 - 15.0 g/dL   HCT 95.2 84.1 - 32.4 %   MCV 94.3 80.0 - 100.0 fL   MCH 29.6 26.0 - 34.0 pg   MCHC 31.4 30.0 - 36.0 g/dL   RDW 40.1 (H) 02.7 - 25.3 %   Platelets 431 (H) 150 - 400 K/uL   nRBC 0.0 0.0 - 0.2 %   Neutrophils Relative % 83 %   Neutro Abs 12.1 (H) 1.7 - 7.7 K/uL   Lymphocytes Relative 11 %   Lymphs Abs 1.7 0.7 - 4.0 K/uL   Monocytes Relative 5 %   Monocytes Absolute 0.7 0.1 - 1.0 K/uL   Eosinophils Relative 0 %   Eosinophils Absolute 0.0 0.0 - 0.5 K/uL   Basophils Relative 0 %   Basophils Absolute 0.0 0.0 - 0.1 K/uL   Immature Granulocytes 1 %   Abs Immature Granulocytes 0.08 (H) 0.00 - 0.07 K/uL    Comment: Performed at [facility], [address], [place], [place] [address]  Hemoglobin A1c     Status: Abnormal   Collection Time: 05/07/23 11:36 PM  Result Value Ref Range   Hgb A1c MFr Bld 5.8 (H) 4.8 - 5.6 %    Comment: (NOTE) Pre diabetes:          5.7%-6.4%  Diabetes:              >6.4%  Glycemic control for   <7.0% adults with diabetes    Mean Plasma Glucose 119.76 mg/dL    Comment: Performed at [facility], [address] [place], [place] [address]  I-Stat Lactic Acid     Status: Abnormal   Collection Time: 05/08/23 12:26 AM  Result Value Ref Range   Lactic Acid, Venous 3.3 (HH) 0.5 - 1.9 mmol/L   Comment NOTIFIED PHYSICIAN   Culture, blood (Routine X 2) w Reflex to ID Panel     Status: None (Preliminary result)   Collection Time: 05/08/23 12:57 AM   Specimen: BLOOD  Result Value Ref Range   Specimen Description BLOOD BLOOD LEFT ARM    Special Requests      BOTTLES DRAWN AEROBIC AND ANAEROBIC Blood Culture results may not be optimal due to an excessive volume of blood received in culture bottles   Culture      NO GROWTH 1 DAY Performed at [facility], [address], [place], [place] [address]    Report Status PENDING   Culture, blood (Routine X 2) w Reflex to ID Panel     Status: None  (Preliminary result)   Collection  Time: 05/08/23  1:39 AM   Specimen: BLOOD  Result Value Ref Range   Specimen Description BLOOD BLOOD RIGHT ARM    Special Requests      BOTTLES DRAWN AEROBIC AND ANAEROBIC Blood Culture adequate volume   Culture      NO GROWTH 1 DAY Performed at [facility], [address], [place], [place] [address]    Report Status PENDING   I-Stat Lactic Acid     Status: None   Collection Time: 05/08/23  1:40 AM  Result Value Ref Range   Lactic Acid, Venous 1.9 0.5 - 1.9 mmol/L  I-stat chem 8, ED     Status: Abnormal   Collection Time: 05/08/23  1:43 AM  Result Value Ref Range   Sodium 132 (L) 135 - 145 mmol/L   Potassium 6.8 (HH) 3.5 - 5.1 mmol/L   Chloride 105 98 - 111 mmol/L   BUN 42 (H) 8 - 23 mg/dL   Creatinine, Ser 1.91 (H) 0.44 - 1.00 mg/dL   Glucose, Bld 96 70 - 99 mg/dL    Comment: Glucose reference range applies only to samples taken after fasting for at least 8 hours.   Calcium, Ion 1.15 1.15 - 1.40 mmol/L   TCO2 20 (L) 22 - 32 mmol/L   Hemoglobin 13.6 12.0 - 15.0 g/dL   HCT 47.8 29.5 - 62.1 %   Comment NOTIFIED PHYSICIAN   CBG monitoring, ED     Status: None   Collection Time: 05/08/23  3:23 AM  Result Value Ref Range   Glucose-Capillary 84 70 - 99 mg/dL    Comment: Glucose reference range applies only to samples taken after fasting for at least 8 hours.  SARS Coronavirus 2 by RT PCR (hospital order, performed in [facility] hospital lab) *cepheid single result test* Anterior Nasal Swab     Status: None   Collection Time: 05/08/23  4:35 AM   Specimen: Anterior Nasal Swab  Result Value Ref Range   SARS Coronavirus 2 by RT PCR NEGATIVE NEGATIVE    Comment: Performed at [facility], [address], [place], [place] [address]  CBC with Differential/Platelet     Status: Abnormal   Collection Time: 05/08/23  6:49 AM  Result Value Ref Range   WBC 12.4 (H) 4.0 - 10.5 K/uL   RBC 3.66 (L) 3.87 - 5.11 MIL/uL   Hemoglobin 11.1 (L) 12.0  - 15.0 g/dL   HCT 78.4 (L) 69.6 - 29.5 %   MCV 94.3 80.0 - 100.0 fL   MCH 30.3 26.0 - 34.0 pg   MCHC 32.2 30.0 - 36.0 g/dL   RDW 28.4 (H) 13.2 - 44.0 %   Platelets 380 150 - 400 K/uL   nRBC 0.0 0.0 - 0.2 %   Neutrophils Relative % 75 %   Neutro Abs 9.2 (H) 1.7 - 7.7 K/uL   Lymphocytes Relative 18 %   Lymphs Abs 2.3 0.7 - 4.0 K/uL   Monocytes Relative 6 %   Monocytes Absolute 0.8 0.1 - 1.0 K/uL   Eosinophils Relative 0 %   Eosinophils Absolute 0.0 0.0 - 0.5 K/uL   Basophils Relative 0 %   Basophils Absolute 0.0 0.0 - 0.1 K/uL   Immature Granulocytes 1 %   Abs Immature Granulocytes 0.08 (H) 0.00 - 0.07 K/uL    Comment: Performed at [facility], [address] [place], [place] [address]  Urinalysis, Complete w Microscopic -Urine, Clean Catch     Status:  Abnormal   Collection Time: 05/08/23  6:49 AM  Result Value Ref Range   Color, Urine YELLOW YELLOW   APPearance HAZY (A) Ho   Specific Gravity, Urine 1.034 (H) 1.005 - 1.030   pH 5.0 5.0 - 8.0   Glucose, UA NEGATIVE NEGATIVE mg/dL   Hgb urine dipstick NEGATIVE NEGATIVE   Bilirubin Urine NEGATIVE NEGATIVE   Ketones, ur 5 (A) NEGATIVE mg/dL   Protein, ur NEGATIVE NEGATIVE mg/dL   Nitrite POSITIVE (A) NEGATIVE   Leukocytes,Ua LARGE (A) NEGATIVE   RBC / HPF 6-10 0 - 5 RBC/hpf   WBC, UA 11-20 0 - 5 WBC/hpf   Bacteria, UA RARE (A) NONE SEEN   Squamous Epithelial / HPF 0-5 0 - 5 /HPF   Mucus PRESENT    Hyaline Casts, UA PRESENT     Comment: Performed at [facility], [address] [place], [place] [address]  Comprehensive metabolic panel     Status: Abnormal   Collection Time: 05/08/23  8:59 AM  Result Value Ref Range   Sodium 132 (L) 135 - 145 mmol/L   Potassium 4.0 3.5 - 5.1 mmol/L   Chloride 99 98 - 111 mmol/L   CO2 19 (L) 22 - 32 mmol/L   Glucose, Bld 76 70 - 99 mg/dL    Comment: Glucose reference range applies only to samples taken after fasting for at least 8 hours.   BUN 21 8 - 23 mg/dL   Creatinine,  Ser 6.04 (H) 0.44 - 1.00 mg/dL   Calcium 8.5 (L) 8.9 - 10.3 mg/dL   Total Protein 5.9 (L) 6.5 - 8.1 g/dL   Albumin 2.1 (L) 3.5 - 5.0 g/dL   AST 21 15 - 41 U/L   ALT 10 0 - 44 U/L   Alkaline Phosphatase 40 38 - 126 U/L   Total Bilirubin 0.8 0.3 - 1.2 mg/dL   GFR, Estimated 58 (L) >60 mL/min    Comment: (NOTE) Calculated using the CKD-EPI Creatinine Equation (2021)    Anion gap 14 5 - 15    Comment: Performed at [facility], [address], [place], [place] [address]  Magnesium     Status: Abnormal   Collection Time: 05/08/23  8:59 AM  Result Value Ref Range   Magnesium 1.3 (L) 1.7 - 2.4 mg/dL    Comment: Performed at [facility], [address], [place], [place] [address]  Procalcitonin     Status: None   Collection Time: 05/08/23  8:59 AM  Result Value Ref Range   Procalcitonin 2.73 ng/mL    Comment:        Interpretation: PCT > 2 ng/mL: Systemic infection (sepsis) is likely, unless other causes are known. (NOTE)       Sepsis PCT Algorithm           Lower Respiratory Tract                                      Infection PCT Algorithm    ----------------------------     ----------------------------         PCT < 0.25 ng/mL                PCT < 0.10 ng/mL          Strongly encourage             Strongly discourage   discontinuation of  antibiotics    initiation of antibiotics    ----------------------------     -----------------------------       PCT 0.25 - 0.50 ng/mL            PCT 0.10 - 0.25 ng/mL               OR       >80% decrease in PCT            Discourage initiation of                                            antibiotics      Encourage discontinuation           of antibiotics    ----------------------------     -----------------------------         PCT >= 0.50 ng/mL              PCT 0.26 - 0.50 ng/mL               AND       <80% decrease in PCT              Encourage initiation of                                             antibiotics        Encourage continuation           of antibiotics    ----------------------------     -----------------------------        PCT >= 0.50 ng/mL                  PCT > 0.50 ng/mL               AND         increase in PCT                  Strongly encourage                                      initiation of antibiotics    Strongly encourage escalation           of antibiotics                                     -----------------------------                                           PCT <= 0.25 ng/mL                                                 OR                                        >  80% decrease in PCT                                      Discontinue / Do not initiate                                             antibiotics  Performed at [facility], [address] [place], [place] [address]   TSH     Status: None   Collection Time: 05/08/23  8:59 AM  Result Value Ref Range   TSH 0.607 0.350 - 4.500 uIU/mL    Comment: Performed by a 3rd Generation assay with a functional sensitivity of <=0.01 uIU/mL. Performed at [facility], [address], [place], [place] [address]   CBG monitoring, ED     Status: None   Collection Time: 05/08/23  5:51 PM  Result Value Ref Range   Glucose-Capillary 91 70 - 99 mg/dL    Comment: Glucose reference range applies only to samples taken after fasting for at least 8 hours.  CBG monitoring, ED     Status: Abnormal   Collection Time: 05/08/23 11:21 PM  Result Value Ref Range   Glucose-Capillary 145 (H) 70 - 99 mg/dL    Comment: Glucose reference range applies only to samples taken after fasting for at least 8 hours.   Comment 1 Notify RN    Comment 2 Document in Chart   Protime-INR     Status: None   Collection Time: 05/09/23  7:42 AM  Result Value Ref Range   Prothrombin Time 15.2 11.4 - 15.2 seconds   INR 1.2 0.8 - 1.2    Comment: (NOTE) INR goal varies based on device and disease states. Performed at [facility],  [address] [place], [place] [address]   CBG monitoring, ED     Status: None   Collection Time: 05/09/23  9:07 AM  Result Value Ref Range   Glucose-Capillary 94 70 - 99 mg/dL    Comment: Glucose reference range applies only to samples taken after fasting for at least 8 hours.   CT CHEST ABDOMEN PELVIS W CONTRAST  Result Date: 05/08/2023 CLINICAL DATA:  Left arm pain and weakness. Left upper lobe mass. Evaluate for metastatic disease. EXAM: CT CHEST, ABDOMEN, AND PELVIS WITH CONTRAST TECHNIQUE: Multidetector CT imaging of the chest, abdomen and pelvis was performed following the standard protocol during bolus administration of intravenous contrast. RADIATION DOSE REDUCTION: This exam was performed according to the departmental dose-optimization program which includes automated exposure control, adjustment of the mA and/or kV according to patient size and/or use of iterative reconstruction technique. CONTRAST:  75mL OMNIPAQUE IOHEXOL 350 MG/ML SOLN COMPARISON:  Chest radiograph dated 05/08/2023. FINDINGS: CT CHEST FINDINGS Cardiovascular: There is no cardiomegaly or pericardial effusion. Moderate calcified and noncalcified plaque of the thoracic aorta. No aneurysmal dilatation or dissection. The origins of the great vessels of the aortic arch appear patent. There is mass effect and compression of the left main pulmonary artery with high-grade narrowing of the vessel. The central pulmonary arteries remain patent. Mediastinum/Nodes: The esophagus is grossly unremarkable. No mediastinal fluid collection. Lungs/Pleura: Large mass occupying the entire left upper lobe extending into the left hilum and mediastinum. The mass measures approximately 13 x 17 cm in  greatest axial dimensions and 15 cm in craniocaudal length. The mass abuts the descending thoracic aorta and aortic arch. There is encasement of the left main pulmonary artery with mass effect and high-grade narrowing. The mass also abuts the distal trachea  and encases the left mainstem bronchus. There is occlusion of the left upper lobe bronchus. Areas of lower attenuation with gas within the mass consistent with necrotic changes. There is invasion of the left chest wall with extension of the mass outside of the intercostal musculature into the left axilla and subpectoral tissues. A 2 x 3 cm lobulated mass in the anterior mediastinum likely contiguous with the left upper lobe mass or represent metastases. Musculoskeletal: There is infiltrative changes of the left 1st-4th ribs. Several old left lower rib fractures. Age indeterminate nondisplaced fracture of the posterior left eighth rib. There is left axillary adenopathy. There is compression of the left supraclinoid vein by the chest wall mass. Age indeterminate, likely chronic compression fracture of T9 with near complete loss of vertebral body height and anterior wedging. Additional old appearing compression fractures of T12 and L1. Correlation with clinical exam and point tenderness recommended. CT ABDOMEN PELVIS FINDINGS No intra-abdominal free air or free fluid. Hepatobiliary: The liver is unremarkable. No biliary dilatation. The gallbladder is unremarkable. Pancreas: Unremarkable. No pancreatic ductal dilatation or surrounding inflammatory changes. Spleen: Normal in size without focal abnormality. Adrenals/Urinary Tract: A 3.2 x 2.2 cm right adrenal nodule most consistent with metastatic disease. Indeterminate 1 cm left adrenal nodule. There is no hydronephrosis on either side. There is symmetric enhancement and excretion of contrast by both kidneys. The visualized ureters and urinary bladder appear unremarkable. Stomach/Bowel: There is sigmoid diverticulosis with muscular hypertrophy. No active inflammatory changes. There is loose stool in the proximal colon. No bowel obstruction or active inflammation. The appendix is normal. Vascular/Lymphatic: Advanced calcified and noncalcified plaque of the abdominal  aorta. The aorta is ectatic measuring 2.5 cm in diameter. The IVC is unremarkable. No portal venous gas. No adenopathy. Reproductive: The uterus is grossly unremarkable. No adnexal masses. Other: None Musculoskeletal: Osteopenia. Chronic appearing L1 compression fracture. IMPRESSION: 1. Large left upper lobe mass as above. Multidisciplinary consult is advised. 2. Right adrenal nodule most consistent with metastatic disease. 3. Age indeterminate, likely chronic compression fracture of T9. Correlation with clinical exam and point tenderness recommended. 4. Sigmoid diverticulosis. No bowel obstruction. Normal appendix. 5.  Aortic Atherosclerosis (ICD10-I70.0). Electronically Signed   By: Elgie Collard M.D.   On: 05/08/2023 03:07   DG Shoulder Left  Result Date: 05/08/2023 CLINICAL DATA:  Worsening shoulder pain EXAM: LEFT SHOULDER - 2+ VIEW COMPARISON:  Radiographs 03/09/2023 FINDINGS: No acute fracture or dislocation of the left shoulder. Age indeterminate left rib fractures. Airspace opacities in the left lung, see separate details. IMPRESSION: 1. No acute fracture or dislocation of the left shoulder. 2. Age indeterminate left rib fractures. 3. Airspace opacities in the left lung, see separate details. Electronically Signed   By: Minerva Fester M.D.   On: 05/08/2023 00:49   DG Chest 2 View  Result Date: 05/08/2023 CLINICAL DATA:  Weakness EXAM: CHEST - 2 VIEW COMPARISON:  None Available. FINDINGS: Dense consolidation within the a left upper lobe with mass effect resulting in deviation of the trachea to the right. Right lung is Ho. No pneumothorax or pleural effusion. Cardiac size is within normal limits. IMPRESSION: 1. Dense consolidation within the left upper lobe with mass effect resulting in deviation of the trachea to the right. While this  may reflect pneumonia, underlying neoplasm is not excluded. CT examination is recommended for further evaluation. Electronically Signed   By: Helyn Numbers M.D.    On: 05/08/2023 00:46    Pending Labs Unresulted Labs (From admission, onward)     Start     Ordered   05/08/23 1238  Protime-INR  Add-on,   AD        05/08/23 1237   05/08/23 1227  Urine Culture (for pregnant, neutropenic or urologic patients or patients with an indwelling urinary catheter)  (Urine Labs)  Add-on,   AD       Question:  Indication  Answer:  Sepsis   05/08/23 1227   05/08/23 0552  Expectorated Sputum Assessment w Gram Stain, Rflx to Resp Cult  Once,   R        05/08/23 0551            Vitals/Pain Today's Vitals   05/09/23 0415 05/09/23 0615 05/09/23 0800 05/09/23 1018  BP: (!) 79/52 (!) 104/52  (!) 109/50  Pulse: 84 88  83  Resp: (!) 22 20  18   Temp:   (!) 97.4 F (36.3 C)   TempSrc:      SpO2: 95% 93%  97%  Weight:      Height:      PainSc:    0-No pain    Isolation Precautions Airborne and Contact precautions  Medications Medications  pravastatin (PRAVACHOL) tablet 40 mg (40 mg Oral Given 05/08/23 0912)  gabapentin (NEURONTIN) capsule 300 mg (300 mg Oral Given 05/08/23 2135)  lidocaine (LIDODERM) 5 % 1 patch (1 patch Transdermal Patch Removed 05/08/23 2136)  acetaminophen (TYLENOL) tablet 650 mg (650 mg Oral Given 05/08/23 2311)    Or  acetaminophen (TYLENOL) suppository 650 mg ( Rectal See Alternative 05/08/23 2311)  oxyCODONE (Oxy IR/ROXICODONE) immediate release tablet 5 mg (5 mg Oral Given 05/09/23 0312)  ondansetron (ZOFRAN) tablet 4 mg (has no administration in time range)    Or  ondansetron (ZOFRAN) injection 4 mg (has no administration in time range)  albuterol (PROVENTIL) (2.5 MG/3ML) 0.083% nebulizer solution 2.5 mg (has no administration in time range)  vancomycin (VANCOCIN) IVPB 1000 mg/200 mL premix (has no administration in time range)  cefTRIAXone (ROCEPHIN) 2 g in sodium chloride 0.9 % 100 mL IVPB (0 g Intravenous Stopped 05/08/23 1354)  feeding supplement (ENSURE ENLIVE / ENSURE PLUS) liquid 237 mL (0 mLs Oral Hold 05/09/23 1000)   heparin injection 5,000 Units (has no administration in time range)  metroNIDAZOLE (FLAGYL) IVPB 500 mg (0 mg Intravenous Stopped 05/08/23 0435)  lactated ringers bolus 1,000 mL (0 mLs Intravenous Stopped 05/08/23 0334)    And  lactated ringers bolus 1,000 mL (0 mLs Intravenous Stopped 05/08/23 0445)    And  lactated ringers bolus 1,000 mL (0 mLs Intravenous Stopped 05/08/23 0551)    And  lactated ringers bolus 500 mL (0 mLs Intravenous Stopped 05/08/23 0736)  vancomycin (VANCOREADY) IVPB 1500 mg/300 mL (0 mg Intravenous Stopped 05/08/23 0647)  ceFEPIme (MAXIPIME) 2 g in sodium chloride 0.9 % 100 mL IVPB (0 g Intravenous Stopped 05/08/23 0334)  iohexol (OMNIPAQUE) 350 MG/ML injection 75 mL (75 mLs Intravenous Contrast Given 05/08/23 0231)  magnesium sulfate IVPB 2 g 50 mL (0 g Intravenous Stopped 05/08/23 1443)  alum & mag hydroxide-simeth (MAALOX/MYLANTA) 200-200-20 MG/5ML suspension 30 mL (30 mLs Oral Given 05/08/23 1354)    Mobility non-ambulatory     Focused Assessments    R Recommendations: See Admitting Provider Note  Report given to:   Additional Notes:

## 2023-05-09 NOTE — Plan of Care (Signed)
  Problem: Coping: Goal: Ability to adjust to condition or change in health will improve Outcome: Progressing   Problem: Fluid Volume: Goal: Ability to maintain a balanced intake and output will improve Outcome: Progressing   Problem: Nutritional: Goal: Maintenance of adequate nutrition will improve Outcome: Progressing   Problem: Skin Integrity: Goal: Risk for impaired skin integrity will decrease Outcome: Progressing   Problem: Tissue Perfusion: Goal: Adequacy of tissue perfusion will improve Outcome: Progressing   Problem: Nutrition: Goal: Adequate nutrition will be maintained Outcome: Progressing   Problem: Elimination: Goal: Will not experience complications related to bowel motility Outcome: Progressing Goal: Will not experience complications related to urinary retention Outcome: Progressing   Problem: Pain Managment: Goal: General experience of comfort will improve Outcome: Progressing   Problem: Safety: Goal: Ability to remain free from injury will improve Outcome: Progressing   Problem: Skin Integrity: Goal: Risk for impaired skin integrity will decrease Outcome: Progressing

## 2023-05-09 NOTE — Progress Notes (Signed)
Progress Note   Patient: Madison Ho NFA:[id number] DOB: November 13, 1950 DOA: 05/07/2023     1 DOS: the patient was seen and examined on 05/09/2023   Brief hospital course: Taunya Bigman is a 72 y.o. female with medical history significant of hypertension, hyperlipidemia, diabetes mellitus type 2 with neuropathy, and obesity who presented with complaints of left arm and shoulder pain.  Patient has been having pain for the last several weeks, followed with orthopedics was told to have nerve impingement.  Patient has been weak, has low appetite and not eating well.  Did lose weight, smoked for more than 25 years but now quit 6 weeks ago.  In the emergency department, she was found to have low-grade temp, tachycardia, low blood pressures improved with IV fluids.  Chest x-ray showed dense consolidation left upper lobe with mass effect, urinalysis abnormal, blood cultures obtained and she is admitted to hospitalist service for further management evaluation of possible sepsis due to UTI, right upper lobe mass  Assessment and Plan: Sepsis secondary to UTI: Blood pressures improved, tachycardia better, lactic acid trended down. Will stop IV fluids. Follow urine cultures, blood cultures. Continue Rocephin therapy. Will DC vancomycin  Left upper lobe lung mass-possible malignancy Patient has left shoulder pain, weight loss, appetite loss. CT scan chest abdomen pelvis revealed concerning mass and right adrenal nodule concerning for metastatic disease. IR consulted for biopsy. Oncology consult placed.  Acute kidney injury: Creatinine improved with IV fluids. Avoid nephrotoxic drugs. Continue to monitor daily renal function.  Hyperkalemia  potassium 6.8 with repeat one 4.2. Continue to monitor daily electrolytes.  Hypomagnesemia: She got IV magnesium supplementation. Monitor daily electrolytes.  Diabetes mellitus type 2, without long-term use of insulin, with neuropathy Hypoglycemic  protocols Hold metformin Hemoglobin A1c 5.8 CBGs before every meal.   Hyperlipidemia Continue pravastatin   Compression fracture Chronic compression fracture of T9, continue pain control. Lidocaine patch as needed. PT/ OT eval   Debility Patient had been unable to get around without need of assistance here over the last few weeks. PT/OT to eval and treat TOC consulted for possible need of placement   Tobacco abuse Smoking for over 25 years, but recently quit 6 to 8 weeks ago due to being unable to ambulate outside of her home to smoke. Smoking cessation counseled.  Obesity- BMI 36.69 Poor oral intake, losing weight. Dietician consult.     Out of bed to chair. Incentive spirometry. Nursing supportive care. Fall, aspiration precautions. DVT prophylaxis   Code Status: Full Code  Subjective: Patient is seen and examined today morning. She is lying on her right due to left shoulder discomfort. Daughter at bedside. Eating poor, awaiting IR lung biopsy.   Physical Exam: Vitals:   05/09/23 1120 05/09/23 1122 05/09/23 1134 05/09/23 1151  BP: 127/74  116/68 (!) 124/56  Pulse: 91 91 92 92  Resp: (!) 26 (!) 21 (!) 22 19  Temp:    98.4 F (36.9 C)  TempSrc:    Oral  SpO2: 92% 95% 95% 93%  Weight:      Height:        General - Elderly obese Caucasian ill female, in distress due to left shoulder pain HEENT - PERRLA, EOMI, atraumatic head, non tender sinuses. Lung - Clear, diffuse rales, rhonchi, no wheezes. Heart - S1, S2 heard, no murmurs, rubs, trace pedal edema. Abdomen - Soft, non tender obese, bowel sounds good Neuro - Alert, awake and oriented x 3, non focal exam. Skin - Warm and dry.  Data Reviewed:      Latest Ref Rng & Units 05/08/2023    6:49 AM 05/08/2023    1:43 AM 05/07/2023   11:36 PM  CBC  WBC 4.0 - 10.5 K/uL 12.4   14.7   Hemoglobin 12.0 - 15.0 g/dL 16.1  09.6  04.5   Hematocrit 36.0 - 46.0 % 34.5  40.0  43.0   Platelets 150 - 400 K/uL 380   431        Latest Ref Rng & Units 05/08/2023    8:59 AM 05/08/2023    1:43 AM 05/07/2023   11:36 PM  BMP  Glucose 70 - 99 mg/dL 76  96  409   BUN 8 - 23 mg/dL 21  42  26   Creatinine 0.44 - 1.00 mg/dL 8.11  9.14  7.82   Sodium 135 - 145 mmol/L 132  132  136   Potassium 3.5 - 5.1 mmol/L 4.0  6.8  4.1   Chloride 98 - 111 mmol/L 99  105  103   CO2 22 - 32 mmol/L 19   17   Calcium 8.9 - 10.3 mg/dL 8.5   9.4    CT CHEST ABDOMEN PELVIS W CONTRAST  Result Date: 05/08/2023 CLINICAL DATA:  Left arm pain and weakness. Left upper lobe mass. Evaluate for metastatic disease. EXAM: CT CHEST, ABDOMEN, AND PELVIS WITH CONTRAST TECHNIQUE: Multidetector CT imaging of the chest, abdomen and pelvis was performed following the standard protocol during bolus administration of intravenous contrast. RADIATION DOSE REDUCTION: This exam was performed according to the departmental dose-optimization program which includes automated exposure control, adjustment of the mA and/or kV according to patient size and/or use of iterative reconstruction technique. CONTRAST:  75mL OMNIPAQUE IOHEXOL 350 MG/ML SOLN COMPARISON:  Chest radiograph dated 05/08/2023. FINDINGS: CT CHEST FINDINGS Cardiovascular: There is no cardiomegaly or pericardial effusion. Moderate calcified and noncalcified plaque of the thoracic aorta. No aneurysmal dilatation or dissection. The origins of the great vessels of the aortic arch appear patent. There is mass effect and compression of the left main pulmonary artery with high-grade narrowing of the vessel. The central pulmonary arteries remain patent. Mediastinum/Nodes: The esophagus is grossly unremarkable. No mediastinal fluid collection. Lungs/Pleura: Large mass occupying the entire left upper lobe extending into the left hilum and mediastinum. The mass measures approximately 13 x 17 cm in greatest axial dimensions and 15 cm in craniocaudal length. The mass abuts the descending thoracic aorta and aortic arch. There  is encasement of the left main pulmonary artery with mass effect and high-grade narrowing. The mass also abuts the distal trachea and encases the left mainstem bronchus. There is occlusion of the left upper lobe bronchus. Areas of lower attenuation with gas within the mass consistent with necrotic changes. There is invasion of the left chest wall with extension of the mass outside of the intercostal musculature into the left axilla and subpectoral tissues. A 2 x 3 cm lobulated mass in the anterior mediastinum likely contiguous with the left upper lobe mass or represent metastases. Musculoskeletal: There is infiltrative changes of the left 1st-4th ribs. Several old left lower rib fractures. Age indeterminate nondisplaced fracture of the posterior left eighth rib. There is left axillary adenopathy. There is compression of the left supraclinoid vein by the chest wall mass. Age indeterminate, likely chronic compression fracture of T9 with near complete loss of vertebral body height and anterior wedging. Additional old appearing compression fractures of T12 and L1. Correlation with clinical exam and point tenderness  recommended. CT ABDOMEN PELVIS FINDINGS No intra-abdominal free air or free fluid. Hepatobiliary: The liver is unremarkable. No biliary dilatation. The gallbladder is unremarkable. Pancreas: Unremarkable. No pancreatic ductal dilatation or surrounding inflammatory changes. Spleen: Normal in size without focal abnormality. Adrenals/Urinary Tract: A 3.2 x 2.2 cm right adrenal nodule most consistent with metastatic disease. Indeterminate 1 cm left adrenal nodule. There is no hydronephrosis on either side. There is symmetric enhancement and excretion of contrast by both kidneys. The visualized ureters and urinary bladder appear unremarkable. Stomach/Bowel: There is sigmoid diverticulosis with muscular hypertrophy. No active inflammatory changes. There is loose stool in the proximal colon. No bowel obstruction or  active inflammation. The appendix is normal. Vascular/Lymphatic: Advanced calcified and noncalcified plaque of the abdominal aorta. The aorta is ectatic measuring 2.5 cm in diameter. The IVC is unremarkable. No portal venous gas. No adenopathy. Reproductive: The uterus is grossly unremarkable. No adnexal masses. Other: None Musculoskeletal: Osteopenia. Chronic appearing L1 compression fracture. IMPRESSION: 1. Large left upper lobe mass as above. Multidisciplinary consult is advised. 2. Right adrenal nodule most consistent with metastatic disease. 3. Age indeterminate, likely chronic compression fracture of T9. Correlation with clinical exam and point tenderness recommended. 4. Sigmoid diverticulosis. No bowel obstruction. Normal appendix. 5.  Aortic Atherosclerosis (ICD10-I70.0). Electronically Signed   By: Elgie Collard M.D.   On: 05/08/2023 03:07   DG Shoulder Left  Result Date: 05/08/2023 CLINICAL DATA:  Worsening shoulder pain EXAM: LEFT SHOULDER - 2+ VIEW COMPARISON:  Radiographs 03/09/2023 FINDINGS: No acute fracture or dislocation of the left shoulder. Age indeterminate left rib fractures. Airspace opacities in the left lung, see separate details. IMPRESSION: 1. No acute fracture or dislocation of the left shoulder. 2. Age indeterminate left rib fractures. 3. Airspace opacities in the left lung, see separate details. Electronically Signed   By: Minerva Fester M.D.   On: 05/08/2023 00:49   DG Chest 2 View  Result Date: 05/08/2023 CLINICAL DATA:  Weakness EXAM: CHEST - 2 VIEW COMPARISON:  None Available. FINDINGS: Dense consolidation within the a left upper lobe with mass effect resulting in deviation of the trachea to the right. Right lung is clear. No pneumothorax or pleural effusion. Cardiac size is within normal limits. IMPRESSION: 1. Dense consolidation within the left upper lobe with mass effect resulting in deviation of the trachea to the right. While this may reflect pneumonia, underlying  neoplasm is not excluded. CT examination is recommended for further evaluation. Electronically Signed   By: Helyn Numbers M.D.   On: 05/08/2023 00:46     Family Communication: Discussed with daughter at bedside, they understand and agree. All questions answereed.    Disposition: Status is: Inpatient Remains inpatient appropriate because: lung mass eval, UTI, debility  Planned Discharge Destination: Home with Home Health     Time spent: 40 minutes  Author: Marcelino Duster, MD 05/09/2023 1:51 PM Secure chat 7am to 7pm For on call review [email or web address].
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
## 2023-05-09 NOTE — Procedures (Signed)
Pre procedural Dx: Left lung/chest wall mass Post procedural Dx: Same  Technically successful image guided biopsy of left upper lobe/chest wall mass   EBL: Trace Complications: None immediate.   Katherina Right, MD Pager #: [phone]

## 2023-05-10 DIAGNOSIS — R918 Other nonspecific abnormal finding of lung field: Secondary | ICD-10-CM | POA: Diagnosis not present

## 2023-05-10 DIAGNOSIS — A419 Sepsis, unspecified organism: Secondary | ICD-10-CM | POA: Diagnosis not present

## 2023-05-10 DIAGNOSIS — N179 Acute kidney failure, unspecified: Secondary | ICD-10-CM | POA: Diagnosis not present

## 2023-05-10 DIAGNOSIS — N3000 Acute cystitis without hematuria: Secondary | ICD-10-CM | POA: Diagnosis not present

## 2023-05-10 LAB — BASIC METABOLIC PANEL
Anion gap: 13 (ref 5–15)
BUN: 13 mg/dL (ref 8–23)
CO2: 23 mmol/L (ref 22–32)
Calcium: 8.7 mg/dL — ABNORMAL LOW (ref 8.9–10.3)
Chloride: 100 mmol/L (ref 98–111)
Creatinine, Ser: 0.93 mg/dL (ref 0.44–1.00)
GFR, Estimated: >60 mL/min (ref >60)
Glucose, Bld: 137 mg/dL — ABNORMAL HIGH (ref 70–99)
Potassium: 4 mmol/L (ref 3.5–5.1)
Sodium: 136 mmol/L (ref 135–145)

## 2023-05-10 LAB — CBC
HCT: 32.3 % — ABNORMAL LOW (ref 36.0–46.0)
Hemoglobin: 10.2 g/dL — ABNORMAL LOW (ref 12.0–15.0)
MCH: 30.1 pg (ref 26.0–34.0)
MCHC: 31.6 g/dL (ref 30.0–36.0)
MCV: 95.3 fL (ref 80.0–100.0)
Platelets: 378 10*3/uL (ref 150–400)
RBC: 3.39 MIL/uL — ABNORMAL LOW (ref 3.87–5.11)
RDW: 19.6 % — ABNORMAL HIGH (ref 11.5–15.5)
WBC: 10.2 10*3/uL (ref 4.0–10.5)
nRBC: 0 % (ref 0.0–0.2)

## 2023-05-10 LAB — GLUCOSE, CAPILLARY
Glucose-Capillary: 150 mg/dL — ABNORMAL HIGH (ref 70–99)
Glucose-Capillary: 141 mg/dL — ABNORMAL HIGH (ref 70–99)
Glucose-Capillary: 147 mg/dL — ABNORMAL HIGH (ref 70–99)
Glucose-Capillary: 136 mg/dL — ABNORMAL HIGH (ref 70–99)

## 2023-05-10 NOTE — Progress Notes (Signed)
Progress Note   Patient: Madison Ho GUY:[id number] DOB: 1950/09/13 DOA: 05/07/2023     2 DOS: the patient was seen and examined on 05/10/2023   Brief hospital course: Madison Ho is a 72 y.o. female with medical history significant of hypertension, hyperlipidemia, diabetes mellitus type 2 with neuropathy, and obesity who presented with complaints of left arm and shoulder pain.  Patient has been having pain for the last several weeks, followed with orthopedics was told to have nerve impingement.  Patient has been weak, has low appetite and not eating well.  Did lose weight, smoked for more than 25 years but now quit 6 weeks ago.  In the emergency department, she was found to have low-grade temp, tachycardia, low blood pressures improved with IV fluids.  Chest x-ray showed dense consolidation left upper lobe with mass effect, urinalysis abnormal, blood cultures obtained and she is admitted to hospitalist service for further management evaluation of possible sepsis due to UTI, right upper lobe mass  Assessment and Plan: Sepsis secondary to UTI: Blood pressures improved, tachycardia better, lactic acid trended down. Follow urine cultures, blood cultures. Continue Rocephin therapy. Monitor vitals closely.   Left upper lobe lung mass-possible malignancy Patient has left shoulder pain, weight loss, appetite loss. CT scan chest abdomen pelvis revealed concerning mass and right adrenal nodule concerning for metastatic disease. IR performed successful image guided biopsy of left upper lobe. Oncology consult with tissue diagnosis.  Acute kidney injury: Creatinine improved with IV fluids. She is eating fair. Stopped fluids. Avoid nephrotoxic drugs. Continue to monitor daily renal function.  Hyperkalemia  K 4.0 improved. Continue to monitor daily electrolytes.  Hypomagnesemia: She got IV magnesium supplementation. Monitor daily electrolytes.  Diabetes mellitus type 2, without long-term  use of insulin, with neuropathy Hypoglycemic protocol Hold metformin Hemoglobin A1c 5.8 CBGs before meal. States eating better now.   Hyperlipidemia Continue pravastatin   Compression fracture Chronic compression fracture of T9, continue pain control. Lidocaine patch as needed. PT/ OT eval   Debility Patient had been unable to get around without need of assistance here over the last few weeks. PT/OT to eval and follow up. TOC consulted for possible need of placement   Tobacco abuse Smoking for over 25 years, but recently quit 6 to 8 weeks ago due to being unable to ambulate outside of her home to smoke. Smoking cessation counseled.  Obesity- BMI 36.69 Poor oral intake, losing weight. Dietician consult.     Out of bed to chair. Incentive spirometry. Nursing supportive care. Fall, aspiration precautions. DVT prophylaxis   Code Status: Full Code  Subjective: Patient is seen and examined today morning. She has left shoulder discomfort. No family at bedside. Eating better. No overnight issues.   Physical Exam: Vitals:   05/09/23 1950 05/10/23 0423 05/10/23 0500 05/10/23 0804  BP: (!) 91/53 (!) 96/47  108/62  Pulse: 96 91  (!) 108  Resp: 18 17  20   Temp: 98.3 F (36.8 C) 98.3 F (36.8 C)  99.8 F (37.7 C)  TempSrc: Oral Oral  Oral  SpO2: 99% 91%  91%  Weight:   61.9 kg   Height:        General - Elderly obese Caucasian ill female, in distress due to left shoulder pain HEENT - PERRLA, EOMI, atraumatic head, non tender sinuses. Lung - Clear, diffuse rales, rhonchi, no wheezes. Heart - S1, S2 heard, no murmurs, rubs, trace pedal edema. Abdomen - Soft, non tender obese, bowel sounds good Neuro - Alert, awake and  oriented x 3, non focal exam. Skin - Warm and dry.  Data Reviewed:      Latest Ref Rng & Units 05/10/2023    7:50 AM 05/08/2023    6:49 AM 05/08/2023    1:43 AM  CBC  WBC 4.0 - 10.5 K/uL 10.2  12.4    Hemoglobin 12.0 - 15.0 g/dL 16.1  09.6  04.5    Hematocrit 36.0 - 46.0 % 32.3  34.5  40.0   Platelets 150 - 400 K/uL 378  380        Latest Ref Rng & Units 05/10/2023    7:50 AM 05/08/2023    8:59 AM 05/08/2023    1:43 AM  BMP  Glucose 70 - 99 mg/dL 409  76  96   BUN 8 - 23 mg/dL 13  21  42   Creatinine 0.44 - 1.00 mg/dL 8.11  9.14  7.82   Sodium 135 - 145 mmol/L 136  132  132   Potassium 3.5 - 5.1 mmol/L 4.0  4.0  6.8   Chloride 98 - 111 mmol/L 100  99  105   CO2 22 - 32 mmol/L 23  19    Calcium 8.9 - 10.3 mg/dL 8.7  8.5     CT LUNG MASS BIOPSY  Result Date: 05/09/2023 INDICATION: No known primary, now with large left upper lobe mass eroding the left chest wall. Please perform image guided biopsy for tissue diagnostic purposes. EXAM: CT AND ULTRASOUND-GUIDED LEFT UPPER LOBE LUNG MASS COMPARISON:  Chest CT-05/08/2023 MEDICATIONS: None. ANESTHESIA/SEDATION: Moderate (conscious) sedation was employed during this procedure as administered by the Interventional Radiology RN. A total of Versed 1 mg and Fentanyl 75 mcg was administered intravenously. Moderate Sedation Time: 15 minutes. The patient's level of consciousness and vital signs were monitored continuously by radiology nursing throughout the procedure under my direct supervision. CONTRAST:  None. COMPLICATIONS: None immediate. PROCEDURE: Informed consent was obtained from the patient following an explanation of the procedure, risks, benefits and alternatives. A time out was performed prior to the initiation of the procedure. The patient was positioned supine on the CT table and a limited CT was performed for procedural planning demonstrating unchanged size and appearance of the aggressive left upper lobe mass eroding through the left anterolateral chest wall. CT gantry table position was marked and the dominant macrolobulated apical component measuring at least 6.6 x 5.2 cm (image 18, series 2), was identified sonographically. The procedure was planned. The operative site was prepped and  draped in the usual sterile fashion. After the overlying soft tissues were anesthetized with 1% lidocaine with epinephrine, a 17 gauge coaxial needle was utilized to access the apically located chest wall component. Multiple ultrasound images were saved procedural documentation purposes. Appropriate position was confirmed with CT imaging (representative image 13, series 3). Next, under direct ultrasound guidance, 6 core needle biopsies were obtained of the left anterior chest wall mass. Biopsy samples were placed in formalin and submitted for analysis. The coaxial needle was removed and postprocedural CT imaging was obtained in negative for the presence a complication. Specifically, no pneumothorax or sizable hematoma. A dressing was applied. The patient tolerated the procedure well without immediate postprocedural complication. IMPRESSION: Technically successful ultrasound and CT guided core needle biopsy of apical anterior component of left upper lobe/chest wall mass. Electronically Signed   By: Simonne Come M.D.   On: 05/09/2023 14:36     Family Communication: Discussed with daughter yesterday, she understands and agrees. All questions answereed.  Disposition: Status is: Inpatient Remains inpatient appropriate because: lung mass eval, UTI, debility  Planned Discharge Destination: Home with Home Health     Time spent: 39 minutes  Author: Marcelino Duster, MD 05/10/2023 3:56 PM Secure chat 7am to 7pm For on call review [email or web address].

## 2023-05-10 NOTE — Plan of Care (Signed)
  Problem: Coping: Goal: Ability to adjust to condition or change in health will improve Outcome: Progressing   Problem: Fluid Volume: Goal: Ability to maintain a balanced intake and output will improve Outcome: Progressing   Problem: Metabolic: Goal: Ability to maintain appropriate glucose levels will improve Outcome: Progressing   Problem: Nutritional: Goal: Maintenance of adequate nutrition will improve Outcome: Progressing   Problem: Skin Integrity: Goal: Risk for impaired skin integrity will decrease Outcome: Progressing   Problem: Tissue Perfusion: Goal: Adequacy of tissue perfusion will improve Outcome: Progressing   Problem: Activity: Goal: Risk for activity intolerance will decrease Outcome: Progressing   Problem: Nutrition: Goal: Adequate nutrition will be maintained Outcome: Progressing   Problem: Pain Managment: Goal: General experience of comfort will improve Outcome: Progressing   Problem: Safety: Goal: Ability to remain free from injury will improve Outcome: Progressing

## 2023-05-10 NOTE — Evaluation (Signed)
Physical Therapy Evaluation Patient Details Name: Madison Ho MRN: [id number] DOB: 17-Oct-1950 Today's Date: 05/10/2023  History of Present Illness  Patient is a 72 year old female with sepsis secondary to UTI, left upper lobe lung mass, left shoulder pain. History of hypertension, hyperlipidemia, diabetes mellitus type 2, neuropathy, obesity, chronic compression fracture T9   Clinical Impression  Patient agreeable to PT evaluation. She reports limited mobility in her home setting with assistance required for transfers at baseline. She reports family is available to assist her 24/7 at home.  Today, the patient required Min A for bed mobility. Mod A required for squat pivot to and from bed side commode. She has generalized weakness and fatigues quickly with activity. Anticipated patient could return home with family support, otherwise would need to consider inpatient rehabilitation if family no longer able to provide physical assistance with mobility.       If plan is discharge home, recommend the following: A lot of help with walking and/or transfers;A lot of help with bathing/dressing/bathroom;Assist for transportation;Help with stairs or ramp for entrance;Assistance with cooking/housework   Can travel by private vehicle        Equipment Recommendations None recommended by PT  Recommendations for Other Services       Functional Status Assessment Patient has had a recent decline in their functional status and demonstrates the ability to make significant improvements in function in a reasonable and predictable amount of time.     Precautions / Restrictions Precautions Precautions: Fall Restrictions Weight Bearing Restrictions: No      Mobility  Bed Mobility Overal bed mobility: Needs Assistance Bed Mobility: Supine to Sit, Sit to Supine     Supine to sit: Min assist Sit to supine: Min assist   General bed mobility comments: assistance for trunk support to sit upright and  assistance for LE support to return to bed    Transfers Overall transfer level: Needs assistance Equipment used: 1 person hand held assist Transfers: Bed to chair/wheelchair/BSC       Squat pivot transfers: Mod assist     General transfer comment: poor posture and unable to come to full standing position. physical assistance is required for transfer to and from bed side commode    Ambulation/Gait               General Gait Details: not attempted as patient reports not ambulating at home  Stairs            Wheelchair Mobility     Tilt Bed    Modified Rankin (Stroke Patients Only)       Balance Overall balance assessment: Needs assistance Sitting-balance support: Bilateral upper extremity supported Sitting balance-Leahy Scale: Poor Sitting balance - Comments: prefers flexed posture while sitting.   Standing balance support: Bilateral upper extremity supported, During functional activity, Reliant on assistive device for balance Standing balance-Leahy Scale: Poor Standing balance comment: external assistance required                             Pertinent Vitals/Pain Pain Assessment Pain Assessment: Faces Faces Pain Scale: Hurts a little bit Pain Location: generalized pain Pain Descriptors / Indicators: Sore Pain Intervention(s): Limited activity within patient's tolerance, Monitored during session, Repositioned    Home Living Family/patient expects to be discharged to:: Private residence Living Arrangements: Children;Other relatives Available Help at Discharge: Family;Available 24 hours/day Type of Home: House Home Access: Stairs to enter Entrance Stairs-Rails: Right Entrance Stairs-Number of  Steps: 7   Home Layout: One level Home Equipment: Rollator (4 wheels);Cane - quad;Cane - single point;BSC/3in1      Prior Function Prior Level of Function : Needs assist       Physical Assist : Mobility (physical);ADLs (physical) Mobility  (physical): Transfers;Gait;Stairs ADLs (physical): Bathing;Dressing;Toileting;IADLs Mobility Comments: does not ambulate much, will take a few stpes with the rollator and then her grandsom pushes her down the hallway ADLs Comments: patient bird bathes at baseline, requires assist for her lower body dressing     Extremity/Trunk Assessment   Upper Extremity Assessment Upper Extremity Assessment: Generalized weakness    Lower Extremity Assessment Lower Extremity Assessment: Generalized weakness    Cervical / Trunk Assessment Cervical / Trunk Assessment: Kyphotic  Communication   Communication Communication: No apparent difficulties  Cognition Arousal: Alert Behavior During Therapy: WFL for tasks assessed/performed Overall Cognitive Status: Within Functional Limits for tasks assessed                                          General Comments General comments (skin integrity, edema, etc.): patient required assistance for peri care following urination in bed side commode    Exercises     Assessment/Plan    PT Assessment Patient needs continued PT services  PT Problem List Decreased strength;Decreased range of motion;Decreased activity tolerance;Decreased balance;Decreased mobility       PT Treatment Interventions DME instruction;Gait training;Functional mobility training;Stair training;Therapeutic activities;Therapeutic exercise;Balance training;Neuromuscular re-education;Cognitive remediation;Patient/family education;Wheelchair mobility training    PT Goals (Current goals can be found in the Care Plan section)  Acute Rehab PT Goals Patient Stated Goal: to go home PT Goal Formulation: With patient Time For Goal Achievement: 05/17/23 Potential to Achieve Goals: Fair    Frequency Min 1X/week     Co-evaluation               AM-PAC PT "6 Clicks" Mobility  Outcome Measure Help needed turning from your back to your side while in a flat bed without  using bedrails?: A Little Help needed moving from lying on your back to sitting on the side of a flat bed without using bedrails?: A Little Help needed moving to and from a bed to a chair (including a wheelchair)?: A Lot Help needed standing up from a chair using your arms (e.g., wheelchair or bedside chair)?: A Lot Help needed to walk in hospital room?: Total Help needed climbing 3-5 steps with a railing? : Total 6 Click Score: 12    End of Session   Activity Tolerance: Patient limited by fatigue Patient left: in bed;with call bell/phone within reach;with bed alarm set   PT Visit Diagnosis: Muscle weakness (generalized) (M62.81);Unsteadiness on feet (R26.81)    Time: 4098-1191 PT Time Calculation (min) (ACUTE ONLY): 15 min   Charges:   PT Evaluation $PT Eval Low Complexity: 1 Low   PT General Charges $$ ACUTE PT VISIT: 1 Visit         Donna Bernard, PT, MPT  [facility] 05/10/2023, 11:56 AM

## 2023-05-11 ENCOUNTER — Inpatient Hospital Stay (HOSPITAL_COMMUNITY): Payer: Medicare PPO

## 2023-05-11 DIAGNOSIS — R918 Other nonspecific abnormal finding of lung field: Secondary | ICD-10-CM | POA: Diagnosis not present

## 2023-05-11 DIAGNOSIS — C3490 Malignant neoplasm of unspecified part of unspecified bronchus or lung: Secondary | ICD-10-CM

## 2023-05-11 DIAGNOSIS — C7970 Secondary malignant neoplasm of unspecified adrenal gland: Secondary | ICD-10-CM

## 2023-05-11 LAB — GLUCOSE, CAPILLARY
Glucose-Capillary: 116 mg/dL — ABNORMAL HIGH (ref 70–99)
Glucose-Capillary: 115 mg/dL — ABNORMAL HIGH (ref 70–99)
Glucose-Capillary: 179 mg/dL — ABNORMAL HIGH (ref 70–99)
Glucose-Capillary: 126 mg/dL — ABNORMAL HIGH (ref 70–99)

## 2023-05-11 LAB — SURGICAL PATHOLOGY

## 2023-05-11 MED ORDER — GADOBUTROL 1 MMOL/ML IV SOLN
8.5000 mL | Freq: Once | INTRAVENOUS | Status: AC | PRN
  Administered 2023-05-11: 8.5 mL via INTRAVENOUS

## 2023-05-11 MED ORDER — TRIAMCINOLONE 0.1 % CREAM:EUCERIN CREAM 1:1
TOPICAL_CREAM | Freq: Three times a day (TID) | CUTANEOUS | Status: DC | PRN
  Administered 2023-05-11: 0.1 via TOPICAL
  Filled 2023-05-11 (×2): qty 1

## 2023-05-11 NOTE — Consult Note (Signed)
[facility] ADMISSION NOTE  Patient Care Team: Ignatius Specking, MD as PCP - General (Internal Medicine)   ASSESSMENT & PLAN:  72 year old pleasant female with medical history of  type II DM, diabetic neuropathy, venous stasis, chronic left shoulder pain is admitted for fatigue found to have metastatic squamous cell carcinoma from left lung mass biopsy.  There is right adrenal metastases.  No other signs of metastasis reported on CT of the body.  I had a long discussion with patient, daughter, son and family at bedside today.  Her findings are more consistent with stage IV or metastatic lung cancer.  We discussed metastatic lung cancer is generally not curable, however treatable.  This will depend on patient's performance status as well.  Family reported previously in good function 3 months ago and she has excellent memory.  We discussed the neck step will be obtaining MRI of the brain to complete staging.  We will request foundation 1 testing for molecular testing.  Discussed that in general, for step is to look for actionable mutation.  If there is actionable mutation, patient will consider for targeted therapy.  If not, PD-L1 expression can be tested for immunotherapy.  Chemoimmunotherapy can be considered in patient with good functional status without other good options. At this time, may be too early to discuss prognosis without further information.  Discussed that her functional status, and improvement will be important in the coming days to weeks to determine if she may be eligible for any cancer directed treatment.  She and her family are motivated and would like to proceed with MRI of the brain, and encouraged to work with physical therapy as well.  We also discussed palliative care.  Without treatment, hospice will be recommended.  Recommend palliative care consult as well for symptom management.  [id number] SCC of lung MRI of the brain Foundation 1 testing Will arrange follow-up  with outpatient lung cancer oncologist Dr. Shirline Frees. Palliative care consult  Thank you for the consult, will follow with you.  Goals of care We discussed palliative care and hospice is recommended if no additional treatment is recommended.  Her PS is borderline.  Pending her functional status and other testing for further discussion.   All questions were answered.   Melven Sartorius, MD 05/11/2023 7:17 PM   CHIEF COMPLAINTS/PURPOSE OF ADMISSION Lung cancer  HISTORY OF PRESENTING ILLNESS:  Madison Ho 72 y.o. female with past medical history significant for type II DM, diabetic neuropathy, venous stasis, chronic left shoulder pain is admitted for fatigue and shoulder pain after seeing her orthopedic for MRI.  Reports that she has subacute onset of fatigue, deconditioning over the last few months.  She is a chronic cigarette smoking since age 42.  She reports that having chronic cough, shortness of breath, however not significantly worse.  Family at bedside reports she has been weaker since the last few months requiring more assistance.  On admission, CT scan found left upper lobe mass right adrenal mass.  Lung mass measuring 13 x 17 x 15 cm with mass abutting the descending thoracic aorta and aortic arch encasement of left main pulmonary artery.  There is chest wall extension.  Biopsy was done on 05/09/2023.  Pathology showed poorly differentiated squamous cell carcinoma.  05/09/23 A. LUNG, LEFT UPPER LOBE, NEEDLE CORE BIOPSY:  Poorly differentiated carcinoma with necrosis.  See comment.   COMMENT:  The carcinoma is characterized by enlarged irregular nuclei with  frequent mitotic figures and areas of tumor  necrosis.  Immunohistochemistry shows positivity with cytokeratin AE1/AE3 and  focal, patchy positivity with cytokeratin 5/6.  The tumor is negative  with TTF-1, Napsin A, p40, CD56, chromogranin and synaptophysin.  Ki-67  shows a markedly increased proliferation rate.  The patchy  cytokeratin  5/6 positivity is most consistent with poorly differentiated squamous  cell carcinoma.   Summary of oncologic history as follows: Oncology History   No history exists.    MEDICAL HISTORY:  Past Medical History:  Diagnosis Date   Diabetes mellitus without complication (HCC)    Encounter for hepatitis C screening test for low risk patient 08/30/2019   Encounter for imaging to assess osteoporosis 08/30/2019   Encounter for screening mammogram for malignant neoplasm of breast 08/30/2019   Hyperlipidemia    Hypertension    Type 2 diabetes mellitus with neurological complications (HCC) 08/30/2019    SURGICAL HISTORY: Past Surgical History:  Procedure Laterality Date   EYE SURGERY N/A    [employer] 11/10/2020   LEG SURGERY     TUBAL LIGATION      SOCIAL HISTORY: Social History   Socioeconomic History   Marital status: Widowed    Spouse name: Not on file   Number of children: 2   Years of education: Not on file   Highest education level: Bachelor's degree (e.g., BA, AB, BS)  Occupational History   Not on file  Tobacco Use   Smoking status: Every Day   Smokeless tobacco: Never  Substance and Sexual Activity   Alcohol use: Yes    Comment: occas   Drug use: Never   Sexual activity: Not Currently  Other Topics Concern   Not on file  Social History Narrative   Lives with Tammy recently moved from [place] in nov 2020   Grandson and her son       Cats      Enjoy: crochet, play merge dragons phone apps game       Diet: loves seafood, eggs, veggies, chicken and sides, limited fast food and red meat   Caffeine: 2 pots of coffee daily, drink a lot of sweet tea, (root beer)   Water: 3 cups daily at least       Wears seat belt   Smoke and carbon monoxide detectors    Does not drive   Social Determinants of Health   Financial Resource Strain: Low Risk  (11/10/2020)   Overall Financial Resource Strain (CARDIA)    Difficulty of Paying Living Expenses: Not hard at all   Food Insecurity: No Food Insecurity (05/08/2023)   Hunger Vital Sign    Worried About Running Out of Food in the Last Year: Never true    Ran Out of Food in the Last Year: Never true  Transportation Needs: No Transportation Needs (05/08/2023)   PRAPARE - Administrator, Civil Service (Medical): No    Lack of Transportation (Non-Medical): No  Physical Activity: Inactive (11/10/2020)   Exercise Vital Sign    Days of Exercise per Week: 0 days    Minutes of Exercise per Session: 0 min  Stress: No Stress Concern Present (11/10/2020)   [employer] of Occupational Health - Occupational Stress Questionnaire    Feeling of Stress : Not at all  Social Connections: Moderately Isolated (11/10/2020)   Social Connection and Isolation Panel [NHANES]    Frequency of Communication with Friends and Family: More than three times a week    Frequency of Social Gatherings with Friends and Family: More than three times  a week    Attends Religious Services: 1 to 4 times per year    Active Member of Clubs or Organizations: No    Attends Banker Meetings: Never    Marital Status: Widowed  Intimate Partner Violence: Not At Risk (05/08/2023)   Humiliation, Afraid, Rape, and Kick questionnaire    Fear of Current or Ex-Partner: No    Emotionally Abused: No    Physically Abused: No    Sexually Abused: No    FAMILY HISTORY: Family History  Problem Relation Age of Onset   Stroke Mother    Heart attack Mother    Heart attack Father     ALLERGIES:  is allergic to penicillins.  MEDICATIONS:  Current Facility-Administered Medications  Medication Dose Route Frequency Provider Last Rate Last Admin   acetaminophen (TYLENOL) tablet 650 mg  650 mg Oral Q6H PRN Zierle-Ghosh, Asia B, DO   650 mg at 05/08/23 2311   Or   acetaminophen (TYLENOL) suppository 650 mg  650 mg Rectal Q6H PRN Zierle-Ghosh, Asia B, DO       albuterol (PROVENTIL) (2.5 MG/3ML) 0.083% nebulizer solution 2.5 mg  2.5 mg  Nebulization Q2H PRN Zierle-Ghosh, Asia B, DO       feeding supplement (ENSURE ENLIVE / ENSURE PLUS) liquid 237 mL  237 mL Oral BID BM Smith, Rondell A, MD   237 mL at 05/11/23 0857   gabapentin (NEURONTIN) capsule 300 mg  300 mg Oral TID Zierle-Ghosh, Asia B, DO   300 mg at 05/11/23 1716   heparin injection 5,000 Units  5,000 Units Subcutaneous Q8H Anders Grant C, NP   5,000 Units at 05/11/23 1423   lidocaine (LIDODERM) 5 % 1 patch  1 patch Transdermal Q24H Zierle-Ghosh, Asia B, DO   1 patch at 05/11/23 0857   ondansetron (ZOFRAN) tablet 4 mg  4 mg Oral Q6H PRN Zierle-Ghosh, Asia B, DO       Or   ondansetron (ZOFRAN) injection 4 mg  4 mg Intravenous Q6H PRN Zierle-Ghosh, Asia B, DO       oxyCODONE (Oxy IR/ROXICODONE) immediate release tablet 5 mg  5 mg Oral Q4H PRN Zierle-Ghosh, Asia B, DO   5 mg at 05/11/23 1458    REVIEW OF SYSTEMS:   Constitutional: Denies fevers, weight loss or abnormal night sweats Eyes: Denies visual change Ears, nose, mouth, throat, and face: Denies sore throat or enlarged tongue Respiratory: Denies cough, shortness of breath or wheezes Cardiovascular: Denies palpitation, chest discomfort or chest pain Gastrointestinal:  Denies nausea, vomiting, diarrhea, constipation, heartburn or abdominal pain GU: Denies any hesitancy, dysuria, frequency, hematuria Skin: Denies abnormal skin rashes Lymphatics: Denies new lymphadenopathy or mass Neurological: Denies numbness, tingling or new weaknesses All other systems were reviewed with the patient and are negative.  PHYSICAL EXAMINATION: ECOG PERFORMANCE STATUS: 3 - Symptomatic, >50% confined to bed  Vitals:   05/11/23 0810 05/11/23 1638  BP: 111/64 113/61  Pulse: 95 (!) 102  Resp: 17 20  Temp: 98 F (36.7 C) 99.7 F (37.6 C)  SpO2: 92% 93%   Filed Weights   05/08/23 0837 05/10/23 0500 05/11/23 0500  Weight: 220 lb 7.4 oz (100 kg) 136 lb 7.4 oz (61.9 kg) 182 lb 8.7 oz (82.8 kg)    GENERAL:alert, no  distress and comfortable SKIN: skin color normal. No jaundice EYES: normal, conjunctiva normal, sclera clear OROPHARYNX: no exudate, moist NECK: supple. No mass LYMPH:  no palpable cervical, axillary or inguinal lymphadenopathy LUNGS: clear to auscultation and  normal breathing effort.  No wheeze or rales HEART: regular rate & rhythm and no murmurs ABDOMEN:abdomen soft, non-tender and normal bowel sounds Musculoskeletal:  no lower extremity edema NEURO: alert & oriented x 3 with fluent speech; no focal motor/sensory deficits Strength and sensation equal bilaterally  LABORATORY DATA:  I have reviewed the data as listed Lab Results  Component Value Date   WBC 10.2 05/10/2023   HGB 10.2 (L) 05/10/2023   HCT 32.3 (L) 05/10/2023   MCV 95.3 05/10/2023   PLT 378 05/10/2023   Recent Labs    05/07/23 2336 05/08/23 0143 05/08/23 0859 05/10/23 0750  NA 136 132* 132* 136  K 4.1 6.8* 4.0 4.0  CL 103 105 99 100  CO2 17*  --  19* 23  GLUCOSE 112* 96 76 137*  BUN 26* 42* 21 13  CREATININE 1.43* 1.30* 1.02* 0.93  CALCIUM 9.4  --  8.5* 8.7*  GFRNONAA 39*  --  58* >60  PROT 7.8  --  5.9*  --   ALBUMIN 2.8*  --  2.1*  --   AST 24  --  21  --   ALT 12  --  10  --   ALKPHOS 55  --  40  --   BILITOT 0.7  --  0.8  --     RADIOGRAPHIC STUDIES: I have personally reviewed the radiological images as listed and agreed with the findings in the report. CT LUNG MASS BIOPSY  Result Date: 05/09/2023 INDICATION: No known primary, now with large left upper lobe mass eroding the left chest wall. Please perform image guided biopsy for tissue diagnostic purposes. EXAM: CT AND ULTRASOUND-GUIDED LEFT UPPER LOBE LUNG MASS COMPARISON:  Chest CT-05/08/2023 MEDICATIONS: None. ANESTHESIA/SEDATION: Moderate (conscious) sedation was employed during this procedure as administered by the Interventional Radiology RN. A total of Versed 1 mg and Fentanyl 75 mcg was administered intravenously. Moderate Sedation Time: 15  minutes. The patient's level of consciousness and vital signs were monitored continuously by radiology nursing throughout the procedure under my direct supervision. CONTRAST:  None. COMPLICATIONS: None immediate. PROCEDURE: Informed consent was obtained from the patient following an explanation of the procedure, risks, benefits and alternatives. A time out was performed prior to the initiation of the procedure. The patient was positioned supine on the CT table and a limited CT was performed for procedural planning demonstrating unchanged size and appearance of the aggressive left upper lobe mass eroding through the left anterolateral chest wall. CT gantry table position was marked and the dominant macrolobulated apical component measuring at least 6.6 x 5.2 cm (image 18, series 2), was identified sonographically. The procedure was planned. The operative site was prepped and draped in the usual sterile fashion. After the overlying soft tissues were anesthetized with 1% lidocaine with epinephrine, a 17 gauge coaxial needle was utilized to access the apically located chest wall component. Multiple ultrasound images were saved procedural documentation purposes. Appropriate position was confirmed with CT imaging (representative image 13, series 3). Next, under direct ultrasound guidance, 6 core needle biopsies were obtained of the left anterior chest wall mass. Biopsy samples were placed in formalin and submitted for analysis. The coaxial needle was removed and postprocedural CT imaging was obtained in negative for the presence a complication. Specifically, no pneumothorax or sizable hematoma. A dressing was applied. The patient tolerated the procedure well without immediate postprocedural complication. IMPRESSION: Technically successful ultrasound and CT guided core needle biopsy of apical anterior component of left upper lobe/chest wall mass.  Electronically Signed   By: Simonne Come M.D.   On: 05/09/2023 14:36   CT  CHEST ABDOMEN PELVIS W CONTRAST  Result Date: 05/08/2023 CLINICAL DATA:  Left arm pain and weakness. Left upper lobe mass. Evaluate for metastatic disease. EXAM: CT CHEST, ABDOMEN, AND PELVIS WITH CONTRAST TECHNIQUE: Multidetector CT imaging of the chest, abdomen and pelvis was performed following the standard protocol during bolus administration of intravenous contrast. RADIATION DOSE REDUCTION: This exam was performed according to the departmental dose-optimization program which includes automated exposure control, adjustment of the mA and/or kV according to patient size and/or use of iterative reconstruction technique. CONTRAST:  75mL OMNIPAQUE IOHEXOL 350 MG/ML SOLN COMPARISON:  Chest radiograph dated 05/08/2023. FINDINGS: CT CHEST FINDINGS Cardiovascular: There is no cardiomegaly or pericardial effusion. Moderate calcified and noncalcified plaque of the thoracic aorta. No aneurysmal dilatation or dissection. The origins of the great vessels of the aortic arch appear patent. There is mass effect and compression of the left main pulmonary artery with high-grade narrowing of the vessel. The central pulmonary arteries remain patent. Mediastinum/Nodes: The esophagus is grossly unremarkable. No mediastinal fluid collection. Lungs/Pleura: Large mass occupying the entire left upper lobe extending into the left hilum and mediastinum. The mass measures approximately 13 x 17 cm in greatest axial dimensions and 15 cm in craniocaudal length. The mass abuts the descending thoracic aorta and aortic arch. There is encasement of the left main pulmonary artery with mass effect and high-grade narrowing. The mass also abuts the distal trachea and encases the left mainstem bronchus. There is occlusion of the left upper lobe bronchus. Areas of lower attenuation with gas within the mass consistent with necrotic changes. There is invasion of the left chest wall with extension of the mass outside of the intercostal musculature into  the left axilla and subpectoral tissues. A 2 x 3 cm lobulated mass in the anterior mediastinum likely contiguous with the left upper lobe mass or represent metastases. Musculoskeletal: There is infiltrative changes of the left 1st-4th ribs. Several old left lower rib fractures. Age indeterminate nondisplaced fracture of the posterior left eighth rib. There is left axillary adenopathy. There is compression of the left supraclinoid vein by the chest wall mass. Age indeterminate, likely chronic compression fracture of T9 with near complete loss of vertebral body height and anterior wedging. Additional old appearing compression fractures of T12 and L1. Correlation with clinical exam and point tenderness recommended. CT ABDOMEN PELVIS FINDINGS No intra-abdominal free air or free fluid. Hepatobiliary: The liver is unremarkable. No biliary dilatation. The gallbladder is unremarkable. Pancreas: Unremarkable. No pancreatic ductal dilatation or surrounding inflammatory changes. Spleen: Normal in size without focal abnormality. Adrenals/Urinary Tract: A 3.2 x 2.2 cm right adrenal nodule most consistent with metastatic disease. Indeterminate 1 cm left adrenal nodule. There is no hydronephrosis on either side. There is symmetric enhancement and excretion of contrast by both kidneys. The visualized ureters and urinary bladder appear unremarkable. Stomach/Bowel: There is sigmoid diverticulosis with muscular hypertrophy. No active inflammatory changes. There is loose stool in the proximal colon. No bowel obstruction or active inflammation. The appendix is normal. Vascular/Lymphatic: Advanced calcified and noncalcified plaque of the abdominal aorta. The aorta is ectatic measuring 2.5 cm in diameter. The IVC is unremarkable. No portal venous gas. No adenopathy. Reproductive: The uterus is grossly unremarkable. No adnexal masses. Other: None Musculoskeletal: Osteopenia. Chronic appearing L1 compression fracture. IMPRESSION: 1. Large  left upper lobe mass as above. Multidisciplinary consult is advised. 2. Right adrenal nodule most consistent with  metastatic disease. 3. Age indeterminate, likely chronic compression fracture of T9. Correlation with clinical exam and point tenderness recommended. 4. Sigmoid diverticulosis. No bowel obstruction. Normal appendix. 5.  Aortic Atherosclerosis (ICD10-I70.0). Electronically Signed   By: Elgie Collard M.D.   On: 05/08/2023 03:07   DG Shoulder Left  Result Date: 05/08/2023 CLINICAL DATA:  Worsening shoulder pain EXAM: LEFT SHOULDER - 2+ VIEW COMPARISON:  Radiographs 03/09/2023 FINDINGS: No acute fracture or dislocation of the left shoulder. Age indeterminate left rib fractures. Airspace opacities in the left lung, see separate details. IMPRESSION: 1. No acute fracture or dislocation of the left shoulder. 2. Age indeterminate left rib fractures. 3. Airspace opacities in the left lung, see separate details. Electronically Signed   By: Minerva Fester M.D.   On: 05/08/2023 00:49   DG Chest 2 View  Result Date: 05/08/2023 CLINICAL DATA:  Weakness EXAM: CHEST - 2 VIEW COMPARISON:  None Available. FINDINGS: Dense consolidation within the a left upper lobe with mass effect resulting in deviation of the trachea to the right. Right lung is clear. No pneumothorax or pleural effusion. Cardiac size is within normal limits. IMPRESSION: 1. Dense consolidation within the left upper lobe with mass effect resulting in deviation of the trachea to the right. While this may reflect pneumonia, underlying neoplasm is not excluded. CT examination is recommended for further evaluation. Electronically Signed   By: Helyn Numbers M.D.   On: 05/08/2023 00:46

## 2023-05-11 NOTE — Plan of Care (Signed)
  Problem: Education: Goal: Ability to describe self-care measures that may prevent or decrease complications (Diabetes Survival Skills Education) will improve Outcome: Progressing   Problem: Education: Goal: Individualized Educational Video(s) Outcome: Progressing   Problem: Coping: Goal: Ability to adjust to condition or change in health will improve Outcome: Progressing   Problem: Health Behavior/Discharge Planning: Goal: Ability to identify and utilize available resources and services will improve Outcome: Progressing   Problem: Health Behavior/Discharge Planning: Goal: Ability to manage health-related needs will improve Outcome: Progressing   Problem: Metabolic: Goal: Ability to maintain appropriate glucose levels will improve Outcome: Progressing   Problem: Skin Integrity: Goal: Risk for impaired skin integrity will decrease Outcome: Progressing   Problem: Tissue Perfusion: Goal: Adequacy of tissue perfusion will improve Outcome: Progressing   Problem: Education: Goal: Knowledge of General Education information will improve Description: Including pain rating scale, medication(s)/side effects and non-pharmacologic comfort measures Outcome: Progressing   Problem: Clinical Measurements: Goal: Ability to maintain clinical measurements within normal limits will improve Outcome: Progressing   Problem: Clinical Measurements: Goal: Will remain free from infection Outcome: Progressing   Problem: Clinical Measurements: Goal: Diagnostic test results will improve Outcome: Progressing   Problem: Clinical Measurements: Goal: Respiratory complications will improve Outcome: Progressing

## 2023-05-11 NOTE — Progress Notes (Signed)
Occupational Therapy Treatment Patient Details Name: Madison Ho MRN: [id number] DOB: 1951/07/09 Today's Date: 05/11/2023   History of present illness Patient is a 72 year old female with sepsis secondary to UTI, left upper lobe lung mass, left shoulder pain. History of hypertension, hyperlipidemia, diabetes mellitus type 2, neuropathy, obesity, chronic compression fracture T9   OT comments  Patient on BSC with NT, RN, and son attempting to help her off of BSC with increased difficulty. OT providing assist, with patient able to complete at mod A and able to move legs without OT assist (stand pivot performed). Education provided to family on appropriate techniques and repositioned back in bed. Of note, patient with increased pain in LUE and guarding with all movement. OT recommendation remains appropriate, will continue to follow.      If plan is discharge home, recommend the following:  A lot of help with walking and/or transfers;A lot of help with bathing/dressing/bathroom;Assistance with cooking/housework;Direct supervision/assist for medications management;Direct supervision/assist for financial management;Assist for transportation;Help with stairs or ramp for entrance   Equipment Recommendations  None recommended by OT (patient has DME needed)    Recommendations for Other Services      Precautions / Restrictions Precautions Precautions: Fall Restrictions Weight Bearing Restrictions: No       Mobility Bed Mobility Overal bed mobility: Needs Assistance Bed Mobility: Sit to Supine       Sit to supine: Min assist   General bed mobility comments: assistance for LE support to return to bed    Transfers Overall transfer level: Needs assistance Equipment used: 1 person hand held assist Transfers: Bed to chair/wheelchair/BSC     Squat pivot transfers: Mod assist       General transfer comment: poor posture and unable to come to full standing position. physical  assistance is required for transfer from bed side commode     Balance Overall balance assessment: Needs assistance Sitting-balance support: Bilateral upper extremity supported Sitting balance-Leahy Scale: Poor Sitting balance - Comments: prefers flexed posture while sitting.   Standing balance support: Bilateral upper extremity supported, During functional activity, Reliant on assistive device for balance Standing balance-Leahy Scale: Poor Standing balance comment: external assistance required                           ADL either performed or assessed with clinical judgement   ADL Overall ADL's : Needs assistance/impaired                         Toilet Transfer: Moderate assistance;BSC/3in1;Stand-pivot Toilet Transfer Details (indicate cue type and reason): completing stand pivot from [facility] with mod A, able to move feet without assist from OT Toileting- Clothing Manipulation and Hygiene: Total assistance;Sit to/from stand;Sitting/lateral lean Toileting - Clothing Manipulation Details (indicate cue type and reason): unable to complete peri-care in standing     Functional mobility during ADLs: Moderate assistance;Cueing for sequencing;Cueing for safety;Rolling walker (2 wheels) General ADL Comments: Patient on BSC with NT, RN, and son attempting to help her off of BSC with increased difficulty. OT providing assist, with patient able to complete at mod A and able to move legs without OT assist (stand pivot performed). Education provided to family on appropriate techniques and repositioned back in bed. OT recommendation remains appropriate, will continue to follow.    Extremity/Trunk Assessment         Cervical / Trunk Assessment Cervical / Trunk Assessment: Kyphotic    Vision  Perception     Praxis      Cognition Arousal: Alert Behavior During Therapy: WFL for tasks assessed/performed Overall Cognitive Status: Within Functional Limits for tasks  assessed                                          Exercises      Shoulder Instructions       General Comments Complaining of increased pain in LUE    Pertinent Vitals/ Pain       Pain Assessment Faces Pain Scale: Hurts whole lot Pain Location: generalized pain and increased in LUE Pain Descriptors / Indicators: Sore  Home Living Family/patient expects to be discharged to:: Private residence Living Arrangements: Children;Other relatives Available Help at Discharge: Family;Available 24 hours/day Type of Home: House Home Access: Stairs to enter [employer] of Steps: 7 Entrance Stairs-Rails: Right Home Layout: One level     Bathroom Shower/Tub: Chief Strategy Officer: Standard     Home Equipment: Rollator (4 wheels);Cane - quad;Cane - single point;BSC/3in1          Prior Functioning/Environment              Frequency  Min 1X/week        Progress Toward Goals  OT Goals(current goals can now be found in the care plan section)  Progress towards OT goals: Progressing toward goals  Acute Rehab OT Goals Patient Stated Goal: to get better OT Goal Formulation: With patient Time For Goal Achievement: 05/23/23 Potential to Achieve Goals: Fair  Plan      Co-evaluation                 AM-PAC OT "6 Clicks" Daily Activity     Outcome Measure   Help from another person eating meals?: A Little Help from another person taking care of personal grooming?: A Little Help from another person toileting, which includes using toliet, bedpan, or urinal?: A Lot Help from another person bathing (including washing, rinsing, drying)?: A Lot Help from another person to put on and taking off regular upper body clothing?: A Little Help from another person to put on and taking off regular lower body clothing?: A Lot 6 Click Score: 15    End of Session Equipment Utilized During Treatment: Gait belt  OT Visit Diagnosis:  Unsteadiness on feet (R26.81);Other abnormalities of gait and mobility (R26.89);Muscle weakness (generalized) (M62.81);Adult, failure to thrive (R62.7)   Activity Tolerance Patient limited by fatigue;Patient limited by pain;Patient limited by lethargy   Patient Left in bed;with call bell/phone within reach;with bed alarm set;with family/visitor present   Nurse Communication Mobility status        Time: 0865-7846 OT Time Calculation (min): 10 min  Charges: OT General Charges $OT Visit: 1 Visit OT Treatments $Self Care/Home Management : 8-22 mins  Pollyann Glen E. Brixon Zhen, OTR/L Acute Rehabilitation Services [phone]   Cherlyn Cushing 05/11/2023, 3:04 PM

## 2023-05-11 NOTE — TOC Initial Note (Signed)
Transition of Care (TOC) - Initial/Assessment Note   Spoke to daughter at bedside. Patient and daughter live together . Patient has walker, rollator and cane at home. Patient currently on oxygen at hospital does not have home oxygen. If needed will need ambulatory oxygen saturation note and order.   Discussed home health PT .   Tresa Endo with [facility] accepted referral will need order and face to face  Patient Details  Name: Madison Ho MRN: [id number] Date of Birth: [birth year]  Transition of Care [facility]) CM/SW Contact:    Kingsley Plan, RN Phone Number: 05/11/2023, 11:46 AM  Clinical Narrative:                   Expected Discharge Plan: Home w Home Health Services Barriers to Discharge: Continued Medical Work up   Patient Goals and CMS Choice Patient states their goals for this hospitalization and ongoing recovery are:: to return to home CMS Medicare.gov Compare Post Acute Care list provided to:: Patient Choice offered to / list presented to : Adult Children      Expected Discharge Plan and Services   Discharge Planning Services: CM Consult Post Acute Care Choice: Home Health Living arrangements for the past 2 months: Single Family Home                 DME Arranged: N/A         HH Arranged: PT HH Agency: [facility] Date HH Agency Contacted: 05/11/23 Time HH Agency Contacted: 1145 [facility] Agency: Tresa Endo  Prior Living Arrangements/Services Living arrangements for the past 2 months: Single Family Home Lives with:: Adult Children Patient language and need for interpreter reviewed:: Yes Do you feel safe going back to the place where you live?: Yes      Need for Family Participation in Patient Care: Yes (Comment) Care giver support system in place?: Yes (comment) Current home services: DME Criminal Activity/Legal Involvement Pertinent to Current Situation/Hospitalization: No - Comment as needed  Activities of Daily Living    ADL Screening (condition at time of admission) Independently performs ADLs?: No Does the patient have a NEW difficulty with bathing/dressing/toileting/self-feeding that is expected to last >3 days?: Yes (Initiates electronic notice to provider for possible OT consult) (unable for last 6 weeks) Does the patient have a NEW difficulty with getting in/out of bed, walking, or climbing stairs that is expected to last >3 days?: Yes (Initiates electronic notice to provider for possible PT consult) (unable for last 6 weeks) Does the patient have a NEW difficulty with communication that is expected to last >3 days?: No Is the patient deaf or have difficulty hearing?: No Does the patient have difficulty seeing, even when wearing glasses/contacts?: No Does the patient have difficulty concentrating, remembering, or making decisions?: No  Permission Sought/Granted   Permission granted to share information with : Yes, Verbal Permission Granted  Share Information with NAME: daughter Babette Relic  Permission granted to share info w AGENCY: [facility]        Emotional Assessment              Admission diagnosis:  AKI (acute kidney injury) (HCC) [N17.9] Fatigue, unspecified type [R53.83] Mass of thoracic structure [R22.2] Patient Active Problem List   Diagnosis Date Noted   AKI (acute kidney injury) (HCC) 05/08/2023   Mass of upper lobe of left lung 05/08/2023   Sepsis (HCC) 05/08/2023   UTI (urinary tract infection) 05/08/2023   Hypomagnesemia 05/08/2023   Transient hypotension 05/08/2023  Left shoulder pain 05/08/2023   Compression fracture of body of thoracic vertebra (HCC) 05/08/2023   Tobacco abuse 05/08/2023   Debility 05/08/2023   Mediastinal mass 05/08/2023   Mass of left lung 05/08/2023   DNR (do not resuscitate) discussion 05/08/2023   Goals of care, counseling/discussion 05/08/2023   Nail fungus 05/27/2020   Generalized joint pain 05/27/2020   Acute conjunctivitis of left eye  05/27/2020   Yeast dermatitis 03/18/2020   Type 2 diabetes mellitus with diabetic neuropathy, without long-term current use of insulin (HCC) 03/18/2020   Venous stasis ulcer of right calf (HCC) 12/17/2019   Morbid obesity (HCC) 08/30/2019   Hyperlipidemia associated with type 2 diabetes mellitus (HCC) 08/30/2019   Diabetic neuropathy (HCC) 08/30/2019   Essential hypertension 08/30/2019   Vitamin D deficiency 08/30/2019   PCP:  Ignatius Specking, MD Pharmacy:   [facility] - [place], [place] - [address] [place] [address] Phone: [phone] Fax: [phone]  [facility] - [place], [place] - [address] [place] [address] Phone: [phone] Fax: [phone]  [facility] [place], [place] - [address] [place] [address] Phone: [phone] Fax: [phone]     Social Determinants of Health (SDOH) Social History: SDOH Screenings   Food Insecurity: No Food Insecurity (05/08/2023)  Housing: Low Risk  (05/08/2023)  Transportation Needs: No Transportation Needs (05/08/2023)  Utilities: Not At Risk (05/08/2023)  Alcohol Screen: Low Risk  (11/10/2020)  Depression (PHQ2-9): Low Risk  (11/10/2020)  Financial Resource Strain: Low Risk  (11/10/2020)  Physical Activity: Inactive (11/10/2020)  Social Connections: Moderately Isolated (11/10/2020)  Stress: No Stress Concern Present (11/10/2020)  Tobacco Use: High Risk (05/07/2023)   SDOH Interventions:     Readmission Risk Interventions     No data to display

## 2023-05-11 NOTE — Progress Notes (Signed)
   05/11/23 0813  What Happened  Was fall witnessed? Yes  Who witnessed fall? Cletus Gash NT  Patients activity before fall bathroom-assisted  Point of contact other (comment) (knees)  Was patient injured? No  Provider Notification  Provider Name/Title Marlin Canary DO  Date Provider Notified 05/11/23  Time Provider Notified [phone]  Method of Notification Page (secure chat)  Notification Reason Fall  Follow Up  Family notified Yes - comment  Time family notified 0805  Progress note created (see row info) Yes  Adult Fall Risk Assessment  Risk Factor Category (scoring not indicated) Not Applicable  Age 72  Fall History: Fall within 6 months prior to admission 0  Elimination; Bowel and/or Urine Incontinence 0  Elimination; Bowel and/or Urine Urgency/Frequency 0  Medications: includes PCA/Opiates, Anti-convulsants, Anti-hypertensives, Diuretics, Hypnotics, Laxatives, Sedatives, and Psychotropics 5  Patient Care Equipment 1  Mobility-Assistance 2  Mobility-Gait 2  Mobility-Sensory Deficit 0  Altered awareness of immediate physical environment 0  Impulsiveness 0  Lack of understanding of one's physical/cognitive limitations 0  Total Score 12  Patient Fall Risk Level Moderate fall risk  Adult Fall Risk Interventions  Required Bundle Interventions *See Row Information* Moderate fall risk - low and moderate requirements implemented  Additional Interventions Use of appropriate toileting equipment (bedpan, BSC, etc.)  Screening for Fall Injury Risk (To be completed on HIGH fall risk patients) - Assessing Need for Floor Mats  Risk For Fall Injury- Criteria for Floor Mats Previous fall this admission  Will Implement Floor Mats Yes

## 2023-05-11 NOTE — Progress Notes (Signed)
Came back to speak with patient and family regarding pathology results-- patient sleeping and daughter asked that I not wake her.  Daughter updated and informed oncology-- Dr. Cherly Hensen would consult.  Daughter would like to get her brother on phone and them tell her the results. JV

## 2023-05-11 NOTE — Progress Notes (Signed)
Progress Note   Patient: Madison Ho WUJ:[id number] DOB: 1950/10/02 DOA: 05/07/2023     3 DOS: the patient was seen and examined on 05/11/2023   Brief hospital course: Aurella Januszewski is a 72 y.o. female with medical history significant of hypertension, hyperlipidemia, diabetes mellitus type 2 with neuropathy, and obesity who presented with complaints of left arm and shoulder pain.  Patient has been having pain for the last several weeks, followed with orthopedics was told to have nerve impingement.  Patient has been weak, has low appetite and not eating well.  Did lose weight, smoked for more than 25 years but now quit 6 weeks ago.  In the emergency department, she was found to have low-grade temp, tachycardia, low blood pressures improved with IV fluids.  Chest x-ray showed dense consolidation left upper lobe with mass effect.  S/p biopsy on 10/9-- pathology pending.     Assessment and Plan:  Sepsis secondary to UTI: Blood pressures improved, tachycardia better, lactic acid trended down. -urine cultures never done, blood cultures NGTD Continue Rocephin x 5 days  Left upper lobe lung mass-possible malignancy Patient has left shoulder pain, weight loss, appetite loss. CT scan chest abdomen pelvis revealed concerning mass and right adrenal nodule concerning for metastatic disease. IR performed successful image guided biopsy of left upper lobe 10/9 Oncology consult once tissue diagnosis is back  Acute kidney injury: Creatinine improved with IV fluids. Avoid nephrotoxic drugs. -labs ordered  Hyperkalemia  -resolved  Hypomagnesemia: -repleted, recheck in AM  Diabetes mellitus type 2, without long-term use of insulin, with neuropathy Hypoglycemic protocol Hold metformin Hemoglobin A1c 5.8 CBGs before meal. States eating better now.   Hyperlipidemia Continue pravastatin   Compression fracture Chronic compression fracture of T9, continue pain control. Lidocaine patch as  needed. PT/ OT eval   Debility Patient had been unable to get around without need of assistance here over the last few weeks. PT/OT to eval  -had witnessed "fall" 10/11-- lowered to knees-- no injuries   Tobacco abuse Smoking for over 25 years, but recently quit 6 to 8 weeks ago due to being unable to ambulate outside of her home to smoke. Smoking cessation counseled.  Obesity- Estimated body mass index is 30.38 kg/m as calculated from the following:   Height as of this encounter: 5\' 5"  (1.651 m).   Weight as of this encounter: 82.8 kg.        Code Status: Full Code  Subjective:  No knee pain-- was lowered to knees from [facility]-- was leaning over to be wiped   Physical Exam: Vitals:   05/11/23 0422 05/11/23 0500 05/11/23 0754 05/11/23 0810  BP: (!) 107/93  (!) 108/56 111/64  Pulse: 93  91 95  Resp: 18  16 17   Temp: 98.8 F (37.1 C)  98.2 F (36.8 C) 98 F (36.7 C)  TempSrc: Oral  Oral Oral  SpO2: 95%  94% 92%  Weight:  82.8 kg    Height:         General: Appearance:    Obese female in no acute distress     Lungs:     respirations unlabored  Heart:    Normal heart rate.   MS:   All extremities are intact.   Neurologic:   Awake, alert     Data Reviewed:      Latest Ref Rng & Units 05/10/2023    7:50 AM 05/08/2023    6:49 AM 05/08/2023    1:43 AM  CBC  WBC 4.0 -  10.5 K/uL 10.2  12.4    Hemoglobin 12.0 - 15.0 g/dL 40.9  81.1  91.4   Hematocrit 36.0 - 46.0 % 32.3  34.5  40.0   Platelets 150 - 400 K/uL 378  380        Latest Ref Rng & Units 05/10/2023    7:50 AM 05/08/2023    8:59 AM 05/08/2023    1:43 AM  BMP  Glucose 70 - 99 mg/dL 782  76  96   BUN 8 - 23 mg/dL 13  21  42   Creatinine 0.44 - 1.00 mg/dL 9.56  2.13  0.86   Sodium 135 - 145 mmol/L 136  132  132   Potassium 3.5 - 5.1 mmol/L 4.0  4.0  6.8   Chloride 98 - 111 mmol/L 100  99  105   CO2 22 - 32 mmol/L 23  19    Calcium 8.9 - 10.3 mg/dL 8.7  8.5     CT LUNG MASS BIOPSY  Result Date:  05/09/2023 INDICATION: No known primary, now with large left upper lobe mass eroding the left chest wall. Please perform image guided biopsy for tissue diagnostic purposes. EXAM: CT AND ULTRASOUND-GUIDED LEFT UPPER LOBE LUNG MASS COMPARISON:  Chest CT-05/08/2023 MEDICATIONS: None. ANESTHESIA/SEDATION: Moderate (conscious) sedation was employed during this procedure as administered by the Interventional Radiology RN. A total of Versed 1 mg and Fentanyl 75 mcg was administered intravenously. Moderate Sedation Time: 15 minutes. The patient's level of consciousness and vital signs were monitored continuously by radiology nursing throughout the procedure under my direct supervision. CONTRAST:  None. COMPLICATIONS: None immediate. PROCEDURE: Informed consent was obtained from the patient following an explanation of the procedure, risks, benefits and alternatives. A time out was performed prior to the initiation of the procedure. The patient was positioned supine on the CT table and a limited CT was performed for procedural planning demonstrating unchanged size and appearance of the aggressive left upper lobe mass eroding through the left anterolateral chest wall. CT gantry table position was marked and the dominant macrolobulated apical component measuring at least 6.6 x 5.2 cm (image 18, series 2), was identified sonographically. The procedure was planned. The operative site was prepped and draped in the usual sterile fashion. After the overlying soft tissues were anesthetized with 1% lidocaine with epinephrine, a 17 gauge coaxial needle was utilized to access the apically located chest wall component. Multiple ultrasound images were saved procedural documentation purposes. Appropriate position was confirmed with CT imaging (representative image 13, series 3). Next, under direct ultrasound guidance, 6 core needle biopsies were obtained of the left anterior chest wall mass. Biopsy samples were placed in formalin and  submitted for analysis. The coaxial needle was removed and postprocedural CT imaging was obtained in negative for the presence a complication. Specifically, no pneumothorax or sizable hematoma. A dressing was applied. The patient tolerated the procedure well without immediate postprocedural complication. IMPRESSION: Technically successful ultrasound and CT guided core needle biopsy of apical anterior component of left upper lobe/chest wall mass. Electronically Signed   By: Simonne Come M.D.   On: 05/09/2023 14:36     Family Communication: daughter at beside  Disposition: Status is: Inpatient Remains inpatient appropriate because: lung mass eval  Planned Discharge Destination: Home with Home Health   Time spent: 30 minutes  Author: Joseph Art, DO 05/11/2023 9:07 AM Secure chat 7am to 7pm For on call review [email or web address].

## 2023-05-11 NOTE — Care Management Important Message (Signed)
Important Message  Patient Details  Name: Madison Ho MRN: [id number] Date of Birth: 1951/02/24   Important Message Given:  Yes - Medicare IM     Sherilyn Banker 05/11/2023, 1:51 PM

## 2023-05-12 DIAGNOSIS — C7931 Secondary malignant neoplasm of brain: Secondary | ICD-10-CM

## 2023-05-12 DIAGNOSIS — R918 Other nonspecific abnormal finding of lung field: Secondary | ICD-10-CM | POA: Diagnosis not present

## 2023-05-12 LAB — GLUCOSE, CAPILLARY
Glucose-Capillary: 100 mg/dL — ABNORMAL HIGH (ref 70–99)
Glucose-Capillary: 135 mg/dL — ABNORMAL HIGH (ref 70–99)
Glucose-Capillary: 137 mg/dL — ABNORMAL HIGH (ref 70–99)
Glucose-Capillary: 131 mg/dL — ABNORMAL HIGH (ref 70–99)

## 2023-05-12 LAB — CBC
HCT: 33.8 % — ABNORMAL LOW (ref 36.0–46.0)
Hemoglobin: 10.4 g/dL — ABNORMAL LOW (ref 12.0–15.0)
MCH: 29.2 pg (ref 26.0–34.0)
MCHC: 30.8 g/dL (ref 30.0–36.0)
MCV: 94.9 fL (ref 80.0–100.0)
Platelets: 412 10*3/uL — ABNORMAL HIGH (ref 150–400)
RBC: 3.56 MIL/uL — ABNORMAL LOW (ref 3.87–5.11)
RDW: 19.6 % — ABNORMAL HIGH (ref 11.5–15.5)
WBC: 11.3 10*3/uL — ABNORMAL HIGH (ref 4.0–10.5)
nRBC: 0 % (ref 0.0–0.2)

## 2023-05-12 LAB — BASIC METABOLIC PANEL
Anion gap: 11 (ref 5–15)
BUN: 10 mg/dL (ref 8–23)
CO2: 23 mmol/L (ref 22–32)
Calcium: 8.7 mg/dL — ABNORMAL LOW (ref 8.9–10.3)
Chloride: 99 mmol/L (ref 98–111)
Creatinine, Ser: 0.92 mg/dL (ref 0.44–1.00)
GFR, Estimated: >60 mL/min (ref >60)
Glucose, Bld: 128 mg/dL — ABNORMAL HIGH (ref 70–99)
Potassium: 4.3 mmol/L (ref 3.5–5.1)
Sodium: 133 mmol/L — ABNORMAL LOW (ref 135–145)

## 2023-05-12 LAB — MAGNESIUM: Magnesium: 1.6 mg/dL — ABNORMAL LOW (ref 1.7–2.4)

## 2023-05-12 NOTE — Progress Notes (Signed)
Late entry for 10/12  HOSPITALIST ROUNDING NOTE Madison Ho [id number]:[id number]  DOB: Jun 24, 1951  DOA: 05/07/2023  PCP: Ignatius Specking, MD  05/12/2023,7:39 AM   LOS: 4 days      Code Status: Presumed full From: Home  current Dispo: Unclear    72 year old home dwelling female HTN HLD DM TY 2 BMI 30 Prior smoker until about 2 months ago Came to emergency room 10/8 with left shoulder pain 40 pound weight loss failure to thrive low-grade fever temp tachycardia and hypotension responsive to IV fluid Urine culture not obtained at admission blood culture X210/8 negative to date CT scan = large left lung mass concerning for primary lung malignancy- 10/9 biopsy left upper lobe showing metastatic squamous cell CA  10/11 oncology consult 10-CT 4N2M1 SCC lung Ordered MRI brain foundation 1 testing and recommended palliative care as performance status borderline   Plan  Presumed sepsis on admission 2/2 UTI? Received 4 days of antibiotics broad-spectrum discontinued on 10/10 blood cultures negative-urine cultures not collected on admission suspect hypotension related to cancer physiology but completed antibiotics regardless At baseline she has some mild cough likely secondary to underlying chronic COPD  COPD smoker till recently Met a stasis causing acute respiratory failure superimposed on her COPD May need palliative radiation  CT 4N2M1 SCC lung metastatic stage IV-right adrenal nodule Await palliative input Extensive discussion with Dr. Cherly Hensen who recommends follow-up as an outpatient and will consider XRT Further as an outpatient with oncology once foundation 1 back for molecular targets She has a brain met but there is no cerebral edema so AED steroids on hold  AKI hyperkalemia hypomagnesemia  creatinine currently improved-saline locked previously   chronic compression fracture T9 Continues on oxycodone 5 every 4 as needed gabapentin 300 3 times daily lidocaine patch   DM TY 2  obesity BMI   metformin held at this time-resume in the next several days if creatinine remains good  Long discussion with the patient's son on the telephone and daughter at the bedside and confirmed that plans are for outpatient close follow-up but may benefit from palliative input--- she has poor performance status currently is on oxygen and unless this improves she may not be a great candidate based on risk-benefit discussion for chemo if performance status does not improve  DVT prophylaxis: Heparin  Status is: Inpatient Remains inpatient appropriate because:   Await skilled placement in the next several days   Subjective: Awake coherent pleasant at the bedside no fever no chills no nausea No chest pain  Objective + exam Vitals:   05/11/23 1638 05/11/23 1941 05/12/23 0412 05/12/23 0500  BP: 113/61 111/62 (!) 89/59   Pulse: (!) 102 98 98   Resp: 20 20 17    Temp: 99.7 F (37.6 C) 98.5 F (36.9 C) 98.4 F (36.9 C)   TempSrc: Oral     SpO2: 93% 93% 94%   Weight:    83.4 kg  Height:       Filed Weights   05/10/23 0500 05/11/23 0500 05/12/23 0500  Weight: 61.9 kg 82.8 kg 83.4 kg    Examination:  Ill-appearing white female looking older than stated age no icterus no pallor Chest clear no added sound no rales no rhonchi Some mild wheeze ROM intact no focal deficit Abdomen soft nontender   Data Reviewed: reviewed   CBC    Component Value Date/Time   WBC 11.3 (H) 05/12/2023 0321   RBC 3.56 (L) 05/12/2023 0321   HGB 10.4 (L) 05/12/2023 0321  HCT 33.8 (L) 05/12/2023 0321   PLT 412 (H) 05/12/2023 0321   MCV 94.9 05/12/2023 0321   MCH 29.2 05/12/2023 0321   MCHC 30.8 05/12/2023 0321   RDW 19.6 (H) 05/12/2023 0321   LYMPHSABS 2.3 05/08/2023 0649   MONOABS 0.8 05/08/2023 0649   EOSABS 0.0 05/08/2023 0649   BASOSABS 0.0 05/08/2023 0649      Latest Ref Rng & Units 05/12/2023    3:21 AM 05/10/2023    7:50 AM 05/08/2023    8:59 AM  CMP  Glucose 70 - 99 mg/dL 409  811  76   BUN  8 - 23 mg/dL 10  13  21    Creatinine 0.44 - 1.00 mg/dL 9.14  7.82  9.56   Sodium 135 - 145 mmol/L 133  136  132   Potassium 3.5 - 5.1 mmol/L 4.3  4.0  4.0   Chloride 98 - 111 mmol/L 99  100  99   CO2 22 - 32 mmol/L 23  23  19    Calcium 8.9 - 10.3 mg/dL 8.7  8.7  8.5   Total Protein 6.5 - 8.1 g/dL   5.9   Total Bilirubin 0.3 - 1.2 mg/dL   0.8   Alkaline Phos 38 - 126 U/L   40   AST 15 - 41 U/L   21   ALT 0 - 44 U/L   10      Scheduled Meds:  feeding supplement  237 mL Oral BID BM   gabapentin  300 mg Oral TID   heparin  5,000 Units Subcutaneous Q8H   lidocaine  1 patch Transdermal Q24H   Continuous Infusions:  Time  44  Rhetta Mura, MD  [facility]

## 2023-05-12 NOTE — Progress Notes (Signed)
Physical Therapy Treatment Patient Details Name: Madison Ho MRN: [id number] DOB: 1950/12/16 Today's Date: 05/12/2023   History of Present Illness Patient is 72 y.o. female presented to the [facility] 05/07/23 w/ arm and neck pain for ~2 months with recent weight loss and FTT. Pt found to have sepsis due to UTI, metastatic squamous cell carcinoma from left upper lobe mass with additional R adrenal metastases and 8mm mass at  the anterior/inferior aspect of the left lentiform nucleus. PMH significant for DM II, diabetic neuropathy,  HTN, HLD.    PT Comments  The pt was seen for continued mobility progression and to establish HEP that family can assist the pt with outside of PT sessions. The pt required modA to complete transition to sitting EOB, and was then able to tolerate sitting EOB for ~8 min for seated exercises before she fatigued requiring return to supine. SpO2 to low of 90% on 3L, HR to 110bpm with seated exercises. Pt family given handout and demonstrated exercises pt can complete supine in bed or seated in chair for additional muscle strengthening of LE, UE, and core, can progress with therabands when pt is ready. Will continue to benefit from skilled PT acutely to progress functional strength, activity tolerance, stability, and endurance. Family feels the pt needs to regain strength prior to initiation of cancer treatment, are in agreement with goal of post-acute therapies <3hour/day to achieve greater volume of therapy and maximize strength after d/c. Pt has had rapid deterioration in strength and endurance during admission so I feel the pt will benefit from increased volume of therapy at post-acute inpatient rehab compared to HHPT.    If plan is discharge home, recommend the following: A lot of help with bathing/dressing/bathroom;Assist for transportation;Help with stairs or ramp for entrance;Assistance with cooking/housework;Two people to help with walking and/or transfers   Can travel by  private vehicle     No  Equipment Recommendations  None recommended by PT    Recommendations for Other Services       Precautions / Restrictions Precautions Precautions: Fall Precaution Comments: on 3L O2 this session, SpO2 92-96% Restrictions Weight Bearing Restrictions: No     Mobility  Bed Mobility Overal bed mobility: Needs Assistance Bed Mobility: Sit to Supine, Supine to Sit, Rolling Rolling: Mod assist, +2 for physical assistance, Used rails   Supine to sit: Mod assist, Used rails, HOB elevated Sit to supine: Min assist   General bed mobility comments: pt needing assist to initiate roll and to elevate trunk, also assist to LE to complete movement out of bed. pt needing assist of 2 to complete roll in bed and scooting up in bed    Transfers                   General transfer comment: pt declined transfer this session due to fatigue after sitting EOB for exercises.      Balance Overall balance assessment: Needs assistance Sitting-balance support: Bilateral upper extremity supported Sitting balance-Leahy Scale: Poor Sitting balance - Comments: prefers flexed posture while sitting.                                    Cognition Arousal: Alert Behavior During Therapy: WFL for tasks assessed/performed Overall Cognitive Status: Within Functional Limits for tasks assessed  General Comments: Oriented, but sometimes with some "off the cuff" responses        Exercises General Exercises - Lower Extremity Long Arc Quad: AROM, Both, 10 reps, Seated Hip Flexion/Marching: AROM, Both, 5 reps, Seated Heel Raises: AROM, Both, 10 reps, Seated Other Exercises Other Exercises: HEP given containing bed-level exercises and exercises for UE and core. pt family educated but pt declined to practice all of them at this time due to fatigue.    General Comments General comments (skin integrity, edema, etc.): SpO2  to low of 88% on 2.5L at rest, improved to low 90s on 3L. low of 90% with seated exercises      Pertinent Vitals/Pain Pain Assessment Pain Assessment: Faces Faces Pain Scale: Hurts even more Pain Location: generalized pain and increased in LUE Pain Descriptors / Indicators: Sore Pain Intervention(s): Monitored during session, Limited activity within patient's tolerance, Repositioned, RN gave pain meds during session           PT Goals (current goals can now be found in the care plan section) Acute Rehab PT Goals Patient Stated Goal: to build strength for treatment PT Goal Formulation: With patient/family Time For Goal Achievement: 05/17/23 Potential to Achieve Goals: Fair Progress towards PT goals: Not progressing toward goals - comment (pt self-limited due to fatigue and pain, did not complete OOB mobility this session)    Frequency    Min 1X/week       AM-PAC PT "6 Clicks" Mobility   Outcome Measure  Help needed turning from your back to your side while in a flat bed without using bedrails?: A Little Help needed moving from lying on your back to sitting on the side of a flat bed without using bedrails?: A Little Help needed moving to and from a bed to a chair (including a wheelchair)?: A Lot Help needed standing up from a chair using your arms (e.g., wheelchair or bedside chair)?: A Lot Help needed to walk in hospital room?: Total Help needed climbing 3-5 steps with a railing? : Total 6 Click Score: 12    End of Session Equipment Utilized During Treatment: Oxygen Activity Tolerance: Patient limited by fatigue Patient left: in bed;with call bell/phone within reach;with bed alarm set Nurse Communication: Mobility status PT Visit Diagnosis: Muscle weakness (generalized) (M62.81);Unsteadiness on feet (R26.81)     Time: 9629-5284 PT Time Calculation (min) (ACUTE ONLY): 31 min  Charges:    $Therapeutic Exercise: 8-22 mins $Therapeutic Activity: 8-22 mins PT  General Charges $$ ACUTE PT VISIT: 1 Visit                     Vickki Muff, PT, DPT   Acute Rehabilitation Department Office [phone] Secure Chat Communication Preferred   Ronnie Derby 05/12/2023, 5:00 PM

## 2023-05-12 NOTE — Progress Notes (Signed)
Madison Ho   DOB:07/25/1951   IH#:[id number]    ASSESSMENT & PLAN:  72 year old pleasant female with medical history of  type II DM, diabetic neuropathy, venous stasis, chronic left shoulder pain is admitted for fatigue found to have metastatic squamous cell carcinoma from left lung mass biopsy.    05/11/23 oncology consult. Discussed with patient and family there is large left lung mass invading various structures, right adrenal metastases.  Her findings are more consistent with stage IV or metastatic lung cancer.  We discussed metastatic lung cancer is generally not curable, however treatable.  This will depend on patient's performance status as well.  Family reported previously in good function 3 months ago and she has excellent memory.  We discussed the next step will be obtaining MRI of the brain to complete staging.  We will request foundation 1 testing for molecular testing.  Discussed that in general, first step is to look for actionable mutation.  If there is actionable mutation, patient will consider for targeted therapy.  If not, PD-L1 expression can be will be next step evaluate for immunotherapy.  Chemoimmunotherapy can be considered in patient with good functional status without other good options.   Patient and family expressed they all would like to pursue cancer directed treatment.   10/12 MRI brain: Solitary 8 mm intracranial metastasis  Discussed results. She is asymptomatic. There is no cerebral edema. Will consult radiation oncology for evaluation of SRS and palliative XRT.  Updated family including son and daughter.  Brain metastasis Solitary. She is asymptomatic. There is no cerebral edema. Hold on start steroid for now. Will consult for SRS   [phone] [facility] of lung MRI of the brain Foundation 1 testing Will arrange follow-up with outpatient lung cancer oncologist Dr. Shirline Ho. Palliative care consult  Acute on chronic respiratory failure On 2.5 L. Will consult for  palliative radiation  Recommend PT, ambulate and increase activity as able.   All questions were answered.     Thank you for the consult. Will follow with you. Madison Sartorius, MD 05/12/2023 8:50 AM  Subjective:  Madison Ho reports feeling the same. Slept well. No worsening short of breath or coughing. No chest pain. No lower extremity weakness. No headache or nausea. Appetite is good.  Objective:  Vitals:   05/12/23 0412 05/12/23 0753  BP: (!) 89/59 106/62  Pulse: 98 95  Resp: 17 15  Temp: 98.4 F (36.9 C) (!) 97.4 F (36.3 C)  SpO2: 94% 97%    No intake or output data in the 24 hours ending 05/12/23 0850  GENERAL: alert, no distress and comfortable SKIN: skin color normal EYES: normal, sclera clear OROPHARYNX: moist NECK: supple, no palpable mass LUNGS: No breath sounds on the left. On oxygen HEART: regular rate & rhythm  ABDOMEN: abdomen soft, non-tender and non-distended Musculoskeletal: no lower extremity edema NEURO: alert with fluent speech, no focal motor/sensory deficits Lower extremity 5/5. Sensation equal to light touch   Labs:  Recent Labs    05/07/23 2336 05/08/23 0143 05/08/23 0859 05/10/23 0750 05/12/23 0321  NA 136   < > 132* 136 133*  K 4.1   < > 4.0 4.0 4.3  CL 103   < > 99 100 99  CO2 17*  --  19* 23 23  GLUCOSE 112*   < > 76 137* 128*  BUN 26*   < > 21 13 10   CREATININE 1.43*   < > 1.02* 0.93 0.92  CALCIUM 9.4  --  8.5* 8.7*  8.7*  GFRNONAA 39*  --  58* >60 >60  PROT 7.8  --  5.9*  --   --   ALBUMIN 2.8*  --  2.1*  --   --   AST 24  --  21  --   --   ALT 12  --  10  --   --   ALKPHOS 55  --  40  --   --   BILITOT 0.7  --  0.8  --   --    < > = values in this interval not displayed.    Studies:  MR BRAIN W WO CONTRAST  Result Date: 05/12/2023 CLINICAL DATA:  Initial evaluation for non-small cell lung cancer, staging. EXAM: MRI HEAD WITHOUT AND WITH CONTRAST TECHNIQUE: Multiplanar, multiecho pulse sequences of the brain and surrounding  structures were obtained without and with intravenous contrast. CONTRAST:  8.59mL GADAVIST GADOBUTROL 1 MMOL/ML IV SOLN COMPARISON:  None available. FINDINGS: Brain: Examination degraded by motion artifact. Cerebral volume within normal limits. Patchy T2/FLAIR hyperintensity involving the periventricular deep white matter both cerebral hemispheres, consistent with chronic small vessel ischemic disease, mild-to-moderate in nature. Patchy involvement of the pons noted. Encephalomalacia and gliosis involving the left occipital lobe, consistent with a chronic left PCA distribution infarct. No evidence for acute or subacute ischemia. No acute or chronic intracranial blood products. Single enhancing intraparenchymal mass centered at the anterior/inferior aspect of the left lentiform nucleus measures 8 mm, consistent with a intracranial metastasis (series 16, image 26). No significant surrounding edema or mass effect. No other visible intracranial metastatic lesions. No other mass lesion, mass effect or midline shift. No hydrocephalus or extra-axial fluid collection. Pituitary gland suprasellar region within normal limits. No other abnormal enhancement. Vascular: Major intracranial vascular flow voids are maintained. Skull and upper cervical spine: Craniocervical junction within normal limits. Bone marrow signal intensity within normal limits. No visible focal marrow replacing lesion. No scalp soft tissue abnormality. Sinuses/Orbits: Prior bilateral ocular lens replacement. Paranasal sinuses are clear. Moderate right mastoid effusion, of uncertain significance. Other: None. IMPRESSION: 1. Solitary 8 mm intracranial metastasis at the anterior/inferior aspect of the left lentiform nucleus. No significant mass effect. 2. Underlying chronic microvascular ischemic disease with chronic left PCA distribution infarct. Electronically Signed   By: Rise Mu M.D.   On: 05/12/2023 03:47   CT LUNG MASS BIOPSY  Result  Date: 05/09/2023 INDICATION: No known primary, now with large left upper lobe mass eroding the left chest wall. Please perform image guided biopsy for tissue diagnostic purposes. EXAM: CT AND ULTRASOUND-GUIDED LEFT UPPER LOBE LUNG MASS COMPARISON:  Chest CT-05/08/2023 MEDICATIONS: None. ANESTHESIA/SEDATION: Moderate (conscious) sedation was employed during this procedure as administered by the Interventional Radiology RN. A total of Versed 1 mg and Fentanyl 75 mcg was administered intravenously. Moderate Sedation Time: 15 minutes. The patient's level of consciousness and vital signs were monitored continuously by radiology nursing throughout the procedure under my direct supervision. CONTRAST:  None. COMPLICATIONS: None immediate. PROCEDURE: Informed consent was obtained from the patient following an explanation of the procedure, risks, benefits and alternatives. A time out was performed prior to the initiation of the procedure. The patient was positioned supine on the CT table and a limited CT was performed for procedural planning demonstrating unchanged size and appearance of the aggressive left upper lobe mass eroding through the left anterolateral chest wall. CT gantry table position was marked and the dominant macrolobulated apical component measuring at least 6.6 x 5.2 cm (image 18, series  2), was identified sonographically. The procedure was planned. The operative site was prepped and draped in the usual sterile fashion. After the overlying soft tissues were anesthetized with 1% lidocaine with epinephrine, a 17 gauge coaxial needle was utilized to access the apically located chest wall component. Multiple ultrasound images were saved procedural documentation purposes. Appropriate position was confirmed with CT imaging (representative image 13, series 3). Next, under direct ultrasound guidance, 6 core needle biopsies were obtained of the left anterior chest wall mass. Biopsy samples were placed in formalin and  submitted for analysis. The coaxial needle was removed and postprocedural CT imaging was obtained in negative for the presence a complication. Specifically, no pneumothorax or sizable hematoma. A dressing was applied. The patient tolerated the procedure well without immediate postprocedural complication. IMPRESSION: Technically successful ultrasound and CT guided core needle biopsy of apical anterior component of left upper lobe/chest wall mass. Electronically Signed   By: Simonne Come M.D.   On: 05/09/2023 14:36   CT CHEST ABDOMEN PELVIS W CONTRAST  Result Date: 05/08/2023 CLINICAL DATA:  Left arm pain and weakness. Left upper lobe mass. Evaluate for metastatic disease. EXAM: CT CHEST, ABDOMEN, AND PELVIS WITH CONTRAST TECHNIQUE: Multidetector CT imaging of the chest, abdomen and pelvis was performed following the standard protocol during bolus administration of intravenous contrast. RADIATION DOSE REDUCTION: This exam was performed according to the departmental dose-optimization program which includes automated exposure control, adjustment of the mA and/or kV according to patient size and/or use of iterative reconstruction technique. CONTRAST:  75mL OMNIPAQUE IOHEXOL 350 MG/ML SOLN COMPARISON:  Chest radiograph dated 05/08/2023. FINDINGS: CT CHEST FINDINGS Cardiovascular: There is no cardiomegaly or pericardial effusion. Moderate calcified and noncalcified plaque of the thoracic aorta. No aneurysmal dilatation or dissection. The origins of the great vessels of the aortic arch appear patent. There is mass effect and compression of the left main pulmonary artery with high-grade narrowing of the vessel. The central pulmonary arteries remain patent. Mediastinum/Nodes: The esophagus is grossly unremarkable. No mediastinal fluid collection. Lungs/Pleura: Large mass occupying the entire left upper lobe extending into the left hilum and mediastinum. The mass measures approximately 13 x 17 cm in greatest axial  dimensions and 15 cm in craniocaudal length. The mass abuts the descending thoracic aorta and aortic arch. There is encasement of the left main pulmonary artery with mass effect and high-grade narrowing. The mass also abuts the distal trachea and encases the left mainstem bronchus. There is occlusion of the left upper lobe bronchus. Areas of lower attenuation with gas within the mass consistent with necrotic changes. There is invasion of the left chest wall with extension of the mass outside of the intercostal musculature into the left axilla and subpectoral tissues. A 2 x 3 cm lobulated mass in the anterior mediastinum likely contiguous with the left upper lobe mass or represent metastases. Musculoskeletal: There is infiltrative changes of the left 1st-4th ribs. Several old left lower rib fractures. Age indeterminate nondisplaced fracture of the posterior left eighth rib. There is left axillary adenopathy. There is compression of the left supraclinoid vein by the chest wall mass. Age indeterminate, likely chronic compression fracture of T9 with near complete loss of vertebral body height and anterior wedging. Additional old appearing compression fractures of T12 and L1. Correlation with clinical exam and point tenderness recommended. CT ABDOMEN PELVIS FINDINGS No intra-abdominal free air or free fluid. Hepatobiliary: The liver is unremarkable. No biliary dilatation. The gallbladder is unremarkable. Pancreas: Unremarkable. No pancreatic ductal dilatation or surrounding inflammatory  changes. Spleen: Normal in size without focal abnormality. Adrenals/Urinary Tract: A 3.2 x 2.2 cm right adrenal nodule most consistent with metastatic disease. Indeterminate 1 cm left adrenal nodule. There is no hydronephrosis on either side. There is symmetric enhancement and excretion of contrast by both kidneys. The visualized ureters and urinary bladder appear unremarkable. Stomach/Bowel: There is sigmoid diverticulosis with muscular  hypertrophy. No active inflammatory changes. There is loose stool in the proximal colon. No bowel obstruction or active inflammation. The appendix is normal. Vascular/Lymphatic: Advanced calcified and noncalcified plaque of the abdominal aorta. The aorta is ectatic measuring 2.5 cm in diameter. The IVC is unremarkable. No portal venous gas. No adenopathy. Reproductive: The uterus is grossly unremarkable. No adnexal masses. Other: None Musculoskeletal: Osteopenia. Chronic appearing L1 compression fracture. IMPRESSION: 1. Large left upper lobe mass as above. Multidisciplinary consult is advised. 2. Right adrenal nodule most consistent with metastatic disease. 3. Age indeterminate, likely chronic compression fracture of T9. Correlation with clinical exam and point tenderness recommended. 4. Sigmoid diverticulosis. No bowel obstruction. Normal appendix. 5.  Aortic Atherosclerosis (ICD10-I70.0). Electronically Signed   By: Elgie Collard M.D.   On: 05/08/2023 03:07   DG Shoulder Left  Result Date: 05/08/2023 CLINICAL DATA:  Worsening shoulder pain EXAM: LEFT SHOULDER - 2+ VIEW COMPARISON:  Radiographs 03/09/2023 FINDINGS: No acute fracture or dislocation of the left shoulder. Age indeterminate left rib fractures. Airspace opacities in the left lung, see separate details. IMPRESSION: 1. No acute fracture or dislocation of the left shoulder. 2. Age indeterminate left rib fractures. 3. Airspace opacities in the left lung, see separate details. Electronically Signed   By: Minerva Fester M.D.   On: 05/08/2023 00:49   DG Chest 2 View  Result Date: 05/08/2023 CLINICAL DATA:  Weakness EXAM: CHEST - 2 VIEW COMPARISON:  None Available. FINDINGS: Dense consolidation within the a left upper lobe with mass effect resulting in deviation of the trachea to the right. Right lung is clear. No pneumothorax or pleural effusion. Cardiac size is within normal limits. IMPRESSION: 1. Dense consolidation within the left upper lobe  with mass effect resulting in deviation of the trachea to the right. While this may reflect pneumonia, underlying neoplasm is not excluded. CT examination is recommended for further evaluation. Electronically Signed   By: Helyn Numbers M.D.   On: 05/08/2023 00:46

## 2023-05-12 NOTE — Plan of Care (Signed)
Problem: Pain Managment: Goal: General experience of comfort will improve Outcome: Progressing   Problem: Safety: Goal: Ability to remain free from injury will improve Outcome: Progressing

## 2023-05-13 ENCOUNTER — Inpatient Hospital Stay (HOSPITAL_COMMUNITY): Payer: Medicare PPO

## 2023-05-13 DIAGNOSIS — R918 Other nonspecific abnormal finding of lung field: Secondary | ICD-10-CM | POA: Diagnosis not present

## 2023-05-13 DIAGNOSIS — D649 Anemia, unspecified: Secondary | ICD-10-CM

## 2023-05-13 LAB — CULTURE, BLOOD (ROUTINE X 2)
Culture: NO GROWTH
Culture: NO GROWTH
Special Requests: ADEQUATE

## 2023-05-13 LAB — GLUCOSE, CAPILLARY: Glucose-Capillary: 163 mg/dL — ABNORMAL HIGH (ref 70–99)

## 2023-05-13 LAB — VITAMIN B12: Vitamin B-12: 277 pg/mL (ref 180–914)

## 2023-05-13 LAB — FERRITIN: Ferritin: 529 ng/mL — ABNORMAL HIGH (ref 11–307)

## 2023-05-13 LAB — FOLATE: Folate: 4.8 ng/mL — ABNORMAL LOW (ref >5.9)

## 2023-05-13 MED ORDER — SODIUM CHLORIDE 0.9 % IV SOLN
2.0000 g | INTRAVENOUS | Status: DC
  Administered 2023-05-13: 2 g via INTRAVENOUS
  Filled 2023-05-13: qty 20

## 2023-05-13 MED ORDER — POLYETHYLENE GLYCOL 3350 17 G PO PACK
17.0000 g | PACK | Freq: Every day | ORAL | Status: DC
  Administered 2023-05-13 – 2023-05-28 (×9): 17 g via ORAL
  Filled 2023-05-13 (×12): qty 1

## 2023-05-13 MED ORDER — SENNOSIDES-DOCUSATE SODIUM 8.6-50 MG PO TABS
1.0000 | ORAL_TABLET | Freq: Every day | ORAL | Status: DC
  Administered 2023-05-13 – 2023-05-27 (×15): 1 via ORAL
  Filled 2023-05-13 (×15): qty 1

## 2023-05-13 NOTE — Progress Notes (Signed)
Mobility Specialist Progress Note    05/13/23 1107  Mobility  Activity Transferred to/from [facility];Transferred from bed to chair  Level of Assistance +2 (takes two people)  Musician;Other (Comment) (HHA)  Distance Ambulated (ft) 2 ft  Activity Response Tolerated fair  Mobility Referral Yes  $Mobility charge 1 Mobility  Mobility Specialist Start Time (ACUTE ONLY) 1040  Mobility Specialist Stop Time (ACUTE ONLY) 1106  Mobility Specialist Time Calculation (min) (ACUTE ONLY) 26 min   Pt received in bed and agreeable. Pt modA+1 for bed mobility. At EOB, pt took a few sips of water and began coughing. Pt's son stated she usually coughs every time she swallows something. After coughing fit, pt stating she can't do it and lay back down supine onto the bed x3. Pt modA+1 to sit back up after each attempt to get back into bed. Pt then stated she needed to pee. Pt modA+2 to stand and pivot to [facility]. Had void. Pt then requesting to get back to bed stating "or else I'll fall out". Pt then modA+2 to stand and pivot to chair. Left with call bell in reach and son present.   RN notified about pt issue with swallowing.   [place] Mobility Specialist  Please Neurosurgeon or Rehab Office at [phone]

## 2023-05-13 NOTE — Progress Notes (Addendum)
HOSPITALIST ROUNDING NOTE Madison Ho VOZ:[id number]  DOB: 17-Jun-1951  DOA: 05/07/2023  PCP: Doreen Beam B, MD  05/13/2023,9:04 AM   LOS: 5 days      Code Status: Presumed full From: Home  current Dispo: Unclear    72 year old home dwelling female HTN HLD DM TY 2 BMI 30 Prior smoker until about 2 months ago Came to emergency room 10/8 with left shoulder pain 40 pound weight loss failure to thrive low-grade fever temp tachycardia and hypotension responsive to IV fluid Urine culture not obtained at admission blood culture X210/8 negative to date CT scan = large left lung mass concerning for primary lung malignancy- 10/9 biopsy left upper lobe showing metastatic squamous cell CA  10/11 oncology consult 10-CT [id number] SCC lung Ordered MRI brain foundation 1 testing and recommended palliative care as performance status borderline   Plan  Presumed sepsis on admission 2/2 UTI? Completed antibiotics 10/10 blood cultures negative-urine cultures not collected At baseline she has some mild cough likely secondary to underlying chronic COPD  COPD smoker till recently Mets causing acute respiratory failure superimposed on her COPD May need palliative radiation as an outpatient-have discussed that planning is contingent on performance status as below Requires desat screen prior to discharge  CT [id number] SCC lung metastatic stage IV-right adrenal nodule Await palliative input-pending may need to be as an outpatient if not occurring in the next 24 to 48 hours Extensive discussion with Dr. Cherly Hensen OP-follow-up as an outpatient and will consider XRT unclear if she is a great target for aggressive chemo Outpatient oncology input once foundation 1 back for molecular targets Brain metastases: No cerebral edema so AED steroids on hold  AKI hyperkalemia hypomagnesemia  creatinine currently improved-saline locked previously--- periodic labs   chronic compression fracture T9 Continues on oxycodone 5 every 4 as  needed gabapentin 300 3 times daily lidocaine patch   DM TY 2  obesity BMI 32  metformin held at this time-may resume at discharge  Briefly discussed with son at the bedside-understands performance status needs to improve before consideration for aggressive chemo  DVT prophylaxis: Heparin  Status is: Inpatient Remains inpatient appropriate because:   Await skilled placement in the next several days   Subjective:  Main complaint is not stooling over several days and worried about getting up out of bed She has no chest pain or fever I encouraged her to keep her oxygen on She will need to get out of bed and significantly improve her mobility to be considered a good candidate for chemo and I reiterated this to her  Objective + exam Vitals:   05/12/23 1644 05/12/23 2300 05/13/23 0500 05/13/23 0741  BP: (!) 124/99 (!) 100/50  95/64  Pulse: 97 100    Resp: 17 18  18   Temp: 98.1 F (36.7 C) 98.1 F (36.7 C)  98.1 F (36.7 C)  TempSrc: Oral Oral  Oral  SpO2: 91% 97%  93%  Weight:   87.7 kg   Height:       Filed Weights   05/11/23 0500 05/12/23 0500 05/13/23 0500  Weight: 82.8 kg 83.4 kg 87.7 kg    Examination:  Looks fair Mallampati is 4 no thyromegaly Chest is clear without wheeze rales rhonchi S1-S2 no murmur Abdomen obese nontender no rebound ROM is intact She has a little bit of lower extremity edema    Data Reviewed: reviewed   CBC    Component Value Date/Time   WBC 11.3 (H) 05/12/2023 0321   RBC 3.56 (  L) 05/12/2023 0321   HGB 10.4 (L) 05/12/2023 0321   HCT 33.8 (L) 05/12/2023 0321   PLT 412 (H) 05/12/2023 0321   MCV 94.9 05/12/2023 0321   MCH 29.2 05/12/2023 0321   MCHC 30.8 05/12/2023 0321   RDW 19.6 (H) 05/12/2023 0321   LYMPHSABS 2.3 05/08/2023 0649   MONOABS 0.8 05/08/2023 0649   EOSABS 0.0 05/08/2023 0649   BASOSABS 0.0 05/08/2023 0649      Latest Ref Rng & Units 05/12/2023    3:21 AM 05/10/2023    7:50 AM 05/08/2023    8:59 AM  CMP   Glucose 70 - 99 mg/dL 846  962  76   BUN 8 - 23 mg/dL 10  13  21    Creatinine 0.44 - 1.00 mg/dL 9.52  8.41  3.24   Sodium 135 - 145 mmol/L 133  136  132   Potassium 3.5 - 5.1 mmol/L 4.3  4.0  4.0   Chloride 98 - 111 mmol/L 99  100  99   CO2 22 - 32 mmol/L 23  23  19    Calcium 8.9 - 10.3 mg/dL 8.7  8.7  8.5   Total Protein 6.5 - 8.1 g/dL   5.9   Total Bilirubin 0.3 - 1.2 mg/dL   0.8   Alkaline Phos 38 - 126 U/L   40   AST 15 - 41 U/L   21   ALT 0 - 44 U/L   10      Scheduled Meds:  feeding supplement  237 mL Oral BID BM   gabapentin  300 mg Oral TID   heparin  5,000 Units Subcutaneous Q8H   lidocaine  1 patch Transdermal Q24H   polyethylene glycol  17 g Oral Daily   senna-docusate  1 tablet Oral QHS   Continuous Infusions:  Time  25  Rhetta Mura, MD  [facility]

## 2023-05-13 NOTE — Progress Notes (Signed)
Patient's family and patient report coughing with PO intake.   Primary RN informed.

## 2023-05-13 NOTE — Evaluation (Signed)
Clinical/Bedside Swallow Evaluation Patient Details  Name: Madison Ho MRN: [id number] Date of Birth: 1951/07/25  Today's Date: 05/13/2023 Time: SLP Start Time (ACUTE ONLY): 1210 SLP Stop Time (ACUTE ONLY): 1220 SLP Time Calculation (min) (ACUTE ONLY): 10 min  Past Medical History:  Past Medical History:  Diagnosis Date   Diabetes mellitus without complication (HCC)    Encounter for hepatitis C screening test for low risk patient 08/30/2019   Encounter for imaging to assess osteoporosis 08/30/2019   Encounter for screening mammogram for malignant neoplasm of breast 08/30/2019   Hyperlipidemia    Hypertension    Type 2 diabetes mellitus with neurological complications (HCC) 08/30/2019   Past Surgical History:  Past Surgical History:  Procedure Laterality Date   EYE SURGERY N/A    [employer] 11/10/2020   LEG SURGERY     TUBAL LIGATION     HPI:  72 year old home dwelling female admitted and found to have lung mass and now brain mets. Pt observed to cough with meals. PMH: HTN HLD DM TY 2 BMI 30  Prior smoker until about 2 months ago  Came to emergency room 10/8 with left shoulder pain 40 pound weight loss failure to thrive low-grade fever temp tachycardia and hypotension responsive to IV fluid . CT scan = large left lung mass concerning for primary lung malignancy-  10/9 biopsy left upper lobe showing metastatic squamous cell CA   10/11 oncology consult 10-CT 4N2M1 SCC lung. MRI brain shows Solitary 8 mm intracranial metastasis at the anterior/inferior  aspect of the left lentiform nucleus. No significant mass effect.    Assessment / Plan / Recommendation  Clinical Impression  Pt demonstrates immediate coughing after sips of water, high suggestive of penetration or aspiration. She says she coughs often if her mouth is dry. Does not have an awareness that she may be aspirating. Severity of problem cannot be determined without instrumental testing, but pt toelrated solids well and risk of  immediate or severe detrimental impact of dysphagia is low. This problem is likely more chronic than acute. Pt may continue diet at this time.   Son arrived and SLP explained findings and potential outcomes of swallowing testing. Son explains that he wants his mother to do what is needed to "get better." Doesn't want her to get sicker and wants her to do therapy though he reports she is generally noncompliant with health recommendations.   Pt has multiple new diagnoses to process. Location of tumor could be impacting swallowing as well. Would not recommend any diet modification that would impact pts QOL as oral intake is a comfort and pleasure to her. She also has potential for worsening dysphagia if there are plans for chemo/radiation in the future. Result of swallowing testing may help clarify pts potential to tolerate interventions. Will proceed with MBS, but suggest palliative care involvement to help pt and family comprehend how plan of care choices could impact pts comfort.   SLP Visit Diagnosis: Dysphagia, unspecified (R13.10)    Aspiration Risk  Mild aspiration risk    Diet Recommendation Regular;Thin liquid    Liquid Administration via: Cup;Straw Medication Administration: Whole meds with liquid Supervision: Patient able to self feed Compensations: Slow rate;Small sips/bites Postural Changes: Seated upright at 90 degrees    Other  Recommendations      Recommendations for follow up therapy are one component of a multi-disciplinary discharge planning process, led by the attending physician.  Recommendations may be updated based on patient status, additional functional criteria and insurance authorization.  Follow up Recommendations Follow physician's recommendations for discharge plan and follow up therapies      Assistance Recommended at Discharge    Functional Status Assessment    Frequency and Duration min 2x/week  1 week       Prognosis Prognosis for improved  oropharyngeal function: Guarded Barriers to Reach Goals: Severity of deficits      Swallow Study   General HPI: 72 year old home dwelling female admitted and found to have lung mass and now brain mets. Pt observed to cough with meals. PMH: HTN HLD DM TY 2 BMI 30  Prior smoker until about 2 months ago  Came to emergency room 10/8 with left shoulder pain 40 pound weight loss failure to thrive low-grade fever temp tachycardia and hypotension responsive to IV fluid . CT scan = large left lung mass concerning for primary lung malignancy-  10/9 biopsy left upper lobe showing metastatic squamous cell CA   10/11 oncology consult 10-CT 4N2M1 SCC lung. MRI brain shows Solitary 8 mm intracranial metastasis at the anterior/inferior  aspect of the left lentiform nucleus. No significant mass effect. Type of Study: Bedside Swallow Evaluation Previous Swallow Assessment: none Diet Prior to this Study: Regular;Thin liquids (Level 0) Temperature Spikes Noted: No Respiratory Status: Room air History of Recent Intubation: No Behavior/Cognition: Alert;Cooperative Oral Cavity Assessment: Within Functional Limits Oral Care Completed by SLP: No Oral Cavity - Dentition: Dentures, top;Dentures, bottom (ill fitting) Self-Feeding Abilities: Able to feed self Patient Positioning: Upright in bed Baseline Vocal Quality: Other (comment) (low pitch, graveley) Volitional Cough: Strong Volitional Swallow: Able to elicit    Oral/Motor/Sensory Function Overall Oral Motor/Sensory Function: Within functional limits   Ice Chips     Thin Liquid Thin Liquid: Impaired Presentation: Self Fed;Straw;Cup Pharyngeal  Phase Impairments: Cough - Immediate    Nectar Thick Nectar Thick Liquid: Not tested   Honey Thick Honey Thick Liquid: Not tested   Puree Puree: Within functional limits Presentation: Spoon   Solid     Solid: Not tested      Madison Ho, Madison Ho 05/13/2023,12:47 PM

## 2023-05-13 NOTE — Progress Notes (Signed)
Madison Ho   DOB:12-02-1950   ON#:[id number]    ASSESSMENT & PLAN:  72 year old pleasant female with medical history of  type II DM, diabetic neuropathy, venous stasis, chronic left shoulder pain is admitted for fatigue found to have metastatic squamous cell carcinoma from left lung mass biopsy.     05/11/23 oncology consult. Discussed with patient and family there is large left lung mass invading various structures, right adrenal metastases.  Her findings are more consistent with stage IV or metastatic lung cancer.  We discussed metastatic lung cancer is generally not curable, however treatable.  This will depend on patient's performance status as well.  Family reported previously in good function 3 months ago and she has excellent memory.  We discussed the next step will be obtaining MRI of the brain to complete staging.  We will request foundation 1 testing for molecular testing.  Discussed that in general, first step is to look for actionable mutation.  If there is actionable mutation, patient will consider for targeted therapy.  If not, PD-L1 expression can be will be next step evaluate for immunotherapy.  Chemoimmunotherapy can be considered in patient with good functional status without other good options.    Patient and family expressed they all would like to pursue cancer directed treatment.    10/12 MRI brain: Solitary 8 mm intracranial metastasis  Discussed results. She is asymptomatic. There is no cerebral edema. Will consult radiation oncology for evaluation of SRS and palliative XRT.  Updated family including son and daughter.   Brain metastasis Solitary. She is asymptomatic. There is no cerebral edema. Hold on start steroid for now. Will consult for SRS   [phone] [facility] of lung MRI of the brain Requesting Foundation 1 testing Will arrange follow-up with outpatient lung cancer oncologist Dr. Shirline Frees. Palliative care consult   Acute on chronic respiratory failure On 2.5 L. Will  consult for palliative radiation   Normocytic anemia Check B12, ferritin and folate  All questions were answered.   Thank you for the consult. Will follow with you. Melven Sartorius, MD 05/13/2023 11:16 AM  Subjective:  Madison Ho reports feeling about the same.  Working with PT yesterday.  No increased shortness of breath or coughing.  No focal weakness.  Objective:  Vitals:   05/12/23 2300 05/13/23 0741  BP: (!) 100/50 95/64  Pulse: 100   Resp: 18 18  Temp: 98.1 F (36.7 C) 98.1 F (36.7 C)  SpO2: 97% 93%    No intake or output data in the 24 hours ending 05/13/23 1116  ECOG 2  GENERAL: alert, no distress and comfortable SKIN: skin color normal EYES: normal, sclera clear LUNGS: on oxygen, normal breathing effort.  Musculoskeletal: no lower extremity edema NEURO: alert with fluent speech, no focal motor/sensory deficits Lower extremity 5/5. Sensation equal to light touch    Labs:  Recent Labs    05/07/23 2336 05/08/23 0143 05/08/23 0859 05/10/23 0750 05/12/23 0321  NA 136   < > 132* 136 133*  K 4.1   < > 4.0 4.0 4.3  CL 103   < > 99 100 99  CO2 17*  --  19* 23 23  GLUCOSE 112*   < > 76 137* 128*  BUN 26*   < > 21 13 10   CREATININE 1.43*   < > 1.02* 0.93 0.92  CALCIUM 9.4  --  8.5* 8.7* 8.7*  GFRNONAA 39*  --  58* >60 >60  PROT 7.8  --  5.9*  --   --  ALBUMIN 2.8*  --  2.1*  --   --   AST 24  --  21  --   --   ALT 12  --  10  --   --   ALKPHOS 55  --  40  --   --   BILITOT 0.7  --  0.8  --   --    < > = values in this interval not displayed.    Studies:  MR BRAIN W WO CONTRAST  Result Date: 05/12/2023 CLINICAL DATA:  Initial evaluation for non-small cell lung cancer, staging. EXAM: MRI HEAD WITHOUT AND WITH CONTRAST TECHNIQUE: Multiplanar, multiecho pulse sequences of the brain and surrounding structures were obtained without and with intravenous contrast. CONTRAST:  8.40mL GADAVIST GADOBUTROL 1 MMOL/ML IV SOLN COMPARISON:  None available. FINDINGS: Brain:  Examination degraded by motion artifact. Cerebral volume within normal limits. Patchy T2/FLAIR hyperintensity involving the periventricular deep white matter both cerebral hemispheres, consistent with chronic small vessel ischemic disease, mild-to-moderate in nature. Patchy involvement of the pons noted. Encephalomalacia and gliosis involving the left occipital lobe, consistent with a chronic left PCA distribution infarct. No evidence for acute or subacute ischemia. No acute or chronic intracranial blood products. Single enhancing intraparenchymal mass centered at the anterior/inferior aspect of the left lentiform nucleus measures 8 mm, consistent with a intracranial metastasis (series 16, image 26). No significant surrounding edema or mass effect. No other visible intracranial metastatic lesions. No other mass lesion, mass effect or midline shift. No hydrocephalus or extra-axial fluid collection. Pituitary gland suprasellar region within normal limits. No other abnormal enhancement. Vascular: Major intracranial vascular flow voids are maintained. Skull and upper cervical spine: Craniocervical junction within normal limits. Bone marrow signal intensity within normal limits. No visible focal marrow replacing lesion. No scalp soft tissue abnormality. Sinuses/Orbits: Prior bilateral ocular lens replacement. Paranasal sinuses are clear. Moderate right mastoid effusion, of uncertain significance. Other: None. IMPRESSION: 1. Solitary 8 mm intracranial metastasis at the anterior/inferior aspect of the left lentiform nucleus. No significant mass effect. 2. Underlying chronic microvascular ischemic disease with chronic left PCA distribution infarct. Electronically Signed   By: Rise Mu M.D.   On: 05/12/2023 03:47   CT LUNG MASS BIOPSY  Result Date: 05/09/2023 INDICATION: No known primary, now with large left upper lobe mass eroding the left chest wall. Please perform image guided biopsy for tissue diagnostic  purposes. EXAM: CT AND ULTRASOUND-GUIDED LEFT UPPER LOBE LUNG MASS COMPARISON:  Chest CT-05/08/2023 MEDICATIONS: None. ANESTHESIA/SEDATION: Moderate (conscious) sedation was employed during this procedure as administered by the Interventional Radiology RN. A total of Versed 1 mg and Fentanyl 75 mcg was administered intravenously. Moderate Sedation Time: 15 minutes. The patient's level of consciousness and vital signs were monitored continuously by radiology nursing throughout the procedure under my direct supervision. CONTRAST:  None. COMPLICATIONS: None immediate. PROCEDURE: Informed consent was obtained from the patient following an explanation of the procedure, risks, benefits and alternatives. A time out was performed prior to the initiation of the procedure. The patient was positioned supine on the CT table and a limited CT was performed for procedural planning demonstrating unchanged size and appearance of the aggressive left upper lobe mass eroding through the left anterolateral chest wall. CT gantry table position was marked and the dominant macrolobulated apical component measuring at least 6.6 x 5.2 cm (image 18, series 2), was identified sonographically. The procedure was planned. The operative site was prepped and draped in the usual sterile fashion. After the overlying soft  tissues were anesthetized with 1% lidocaine with epinephrine, a 17 gauge coaxial needle was utilized to access the apically located chest wall component. Multiple ultrasound images were saved procedural documentation purposes. Appropriate position was confirmed with CT imaging (representative image 13, series 3). Next, under direct ultrasound guidance, 6 core needle biopsies were obtained of the left anterior chest wall mass. Biopsy samples were placed in formalin and submitted for analysis. The coaxial needle was removed and postprocedural CT imaging was obtained in negative for the presence a complication. Specifically, no  pneumothorax or sizable hematoma. A dressing was applied. The patient tolerated the procedure well without immediate postprocedural complication. IMPRESSION: Technically successful ultrasound and CT guided core needle biopsy of apical anterior component of left upper lobe/chest wall mass. Electronically Signed   By: Simonne Come M.D.   On: 05/09/2023 14:36   CT CHEST ABDOMEN PELVIS W CONTRAST  Result Date: 05/08/2023 CLINICAL DATA:  Left arm pain and weakness. Left upper lobe mass. Evaluate for metastatic disease. EXAM: CT CHEST, ABDOMEN, AND PELVIS WITH CONTRAST TECHNIQUE: Multidetector CT imaging of the chest, abdomen and pelvis was performed following the standard protocol during bolus administration of intravenous contrast. RADIATION DOSE REDUCTION: This exam was performed according to the departmental dose-optimization program which includes automated exposure control, adjustment of the mA and/or kV according to patient size and/or use of iterative reconstruction technique. CONTRAST:  75mL OMNIPAQUE IOHEXOL 350 MG/ML SOLN COMPARISON:  Chest radiograph dated 05/08/2023. FINDINGS: CT CHEST FINDINGS Cardiovascular: There is no cardiomegaly or pericardial effusion. Moderate calcified and noncalcified plaque of the thoracic aorta. No aneurysmal dilatation or dissection. The origins of the great vessels of the aortic arch appear patent. There is mass effect and compression of the left main pulmonary artery with high-grade narrowing of the vessel. The central pulmonary arteries remain patent. Mediastinum/Nodes: The esophagus is grossly unremarkable. No mediastinal fluid collection. Lungs/Pleura: Large mass occupying the entire left upper lobe extending into the left hilum and mediastinum. The mass measures approximately 13 x 17 cm in greatest axial dimensions and 15 cm in craniocaudal length. The mass abuts the descending thoracic aorta and aortic arch. There is encasement of the left main pulmonary artery with  mass effect and high-grade narrowing. The mass also abuts the distal trachea and encases the left mainstem bronchus. There is occlusion of the left upper lobe bronchus. Areas of lower attenuation with gas within the mass consistent with necrotic changes. There is invasion of the left chest wall with extension of the mass outside of the intercostal musculature into the left axilla and subpectoral tissues. A 2 x 3 cm lobulated mass in the anterior mediastinum likely contiguous with the left upper lobe mass or represent metastases. Musculoskeletal: There is infiltrative changes of the left 1st-4th ribs. Several old left lower rib fractures. Age indeterminate nondisplaced fracture of the posterior left eighth rib. There is left axillary adenopathy. There is compression of the left supraclinoid vein by the chest wall mass. Age indeterminate, likely chronic compression fracture of T9 with near complete loss of vertebral body height and anterior wedging. Additional old appearing compression fractures of T12 and L1. Correlation with clinical exam and point tenderness recommended. CT ABDOMEN PELVIS FINDINGS No intra-abdominal free air or free fluid. Hepatobiliary: The liver is unremarkable. No biliary dilatation. The gallbladder is unremarkable. Pancreas: Unremarkable. No pancreatic ductal dilatation or surrounding inflammatory changes. Spleen: Normal in size without focal abnormality. Adrenals/Urinary Tract: A 3.2 x 2.2 cm right adrenal nodule most consistent with metastatic disease. Indeterminate  1 cm left adrenal nodule. There is no hydronephrosis on either side. There is symmetric enhancement and excretion of contrast by both kidneys. The visualized ureters and urinary bladder appear unremarkable. Stomach/Bowel: There is sigmoid diverticulosis with muscular hypertrophy. No active inflammatory changes. There is loose stool in the proximal colon. No bowel obstruction or active inflammation. The appendix is normal.  Vascular/Lymphatic: Advanced calcified and noncalcified plaque of the abdominal aorta. The aorta is ectatic measuring 2.5 cm in diameter. The IVC is unremarkable. No portal venous gas. No adenopathy. Reproductive: The uterus is grossly unremarkable. No adnexal masses. Other: None Musculoskeletal: Osteopenia. Chronic appearing L1 compression fracture. IMPRESSION: 1. Large left upper lobe mass as above. Multidisciplinary consult is advised. 2. Right adrenal nodule most consistent with metastatic disease. 3. Age indeterminate, likely chronic compression fracture of T9. Correlation with clinical exam and point tenderness recommended. 4. Sigmoid diverticulosis. No bowel obstruction. Normal appendix. 5.  Aortic Atherosclerosis (ICD10-I70.0). Electronically Signed   By: Elgie Collard M.D.   On: 05/08/2023 03:07   DG Shoulder Left  Result Date: 05/08/2023 CLINICAL DATA:  Worsening shoulder pain EXAM: LEFT SHOULDER - 2+ VIEW COMPARISON:  Radiographs 03/09/2023 FINDINGS: No acute fracture or dislocation of the left shoulder. Age indeterminate left rib fractures. Airspace opacities in the left lung, see separate details. IMPRESSION: 1. No acute fracture or dislocation of the left shoulder. 2. Age indeterminate left rib fractures. 3. Airspace opacities in the left lung, see separate details. Electronically Signed   By: Minerva Fester M.D.   On: 05/08/2023 00:49   DG Chest 2 View  Result Date: 05/08/2023 CLINICAL DATA:  Weakness EXAM: CHEST - 2 VIEW COMPARISON:  None Available. FINDINGS: Dense consolidation within the a left upper lobe with mass effect resulting in deviation of the trachea to the right. Right lung is clear. No pneumothorax or pleural effusion. Cardiac size is within normal limits. IMPRESSION: 1. Dense consolidation within the left upper lobe with mass effect resulting in deviation of the trachea to the right. While this may reflect pneumonia, underlying neoplasm is not excluded. CT examination is  recommended for further evaluation. Electronically Signed   By: Helyn Numbers M.D.   On: 05/08/2023 00:46

## 2023-05-13 NOTE — Progress Notes (Deleted)
Pharmacy Antibiotic Note  Madison Ho is a 72 y.o. female admitted on 05/07/2023 with  aspiration PNA .  Pharmacy has been consulted for zosyn dosing.  Received 3 days of abx from 10/8 to 10/10. WBC yesterday 11.3. Scr 0.92 (CrCl 59 mL/min). CT finding large L lung mass concerning for primary lung malignancy with biopsy finding metastatic squamous cell CA. Has been having coughing fits after oral intake.   Plan: Zosyn 3.375g IV q8h (4 hour infusion). Monitor renal fx, clinical pic, cx results  Height: 5\' 5"  (165.1 cm) Weight: 87.7 kg (193 lb 5.5 oz) IBW/kg (Calculated) : 57  Temp (24hrs), Avg:98.2 F (36.8 C), Min:98.1 F (36.7 C), Max:98.7 F (37.1 C)  Recent Labs  Lab 05/07/23 2336 05/08/23 0026 05/08/23 0140 05/08/23 0143 05/08/23 0649 05/08/23 0859 05/10/23 0750 05/12/23 0321  WBC 14.7*  --   --   --  12.4*  --  10.2 11.3*  CREATININE 1.43*  --   --  1.30*  --  1.02* 0.93 0.92  LATICACIDVEN  --  3.3* 1.9  --   --   --   --   --     Estimated Creatinine Clearance: 59.6 mL/min (by C-G formula based on SCr of 0.92 mg/dL).    Allergies  Allergen Reactions   Penicillins Swelling    Has taken cipro with no problems    Antimicrobials this admission: Cefepime/metronidazole 10/8 x1 Ceftriaxone 10/8 >> 10/10 Vancomycin 10/8 >> 10/9 Zosyn 10/13 >>  Dose adjustments this admission: N/A  Microbiology results: 10/8 BCx: ngtd 10/8 UCx: collected   Thank you for allowing pharmacy to participate in this patient's care,  Sherron Monday, PharmD, BCCCP Clinical Pharmacist  Phone: [phone] 05/13/2023 7:03 PM  Please check AMION for all [facility] phone numbers After 10:00 PM, call Main Pharmacy [phone]

## 2023-05-13 NOTE — Plan of Care (Signed)
  Problem: Pain Managment: Goal: General experience of comfort will improve Outcome: Progressing   Problem: Safety: Goal: Ability to remain free from injury will improve Outcome: Progressing   

## 2023-05-14 ENCOUNTER — Inpatient Hospital Stay (HOSPITAL_COMMUNITY): Payer: Medicare PPO

## 2023-05-14 DIAGNOSIS — Z7189 Other specified counseling: Secondary | ICD-10-CM | POA: Diagnosis not present

## 2023-05-14 DIAGNOSIS — Z515 Encounter for palliative care: Secondary | ICD-10-CM | POA: Diagnosis not present

## 2023-05-14 DIAGNOSIS — C3490 Malignant neoplasm of unspecified part of unspecified bronchus or lung: Secondary | ICD-10-CM | POA: Diagnosis not present

## 2023-05-14 DIAGNOSIS — E538 Deficiency of other specified B group vitamins: Secondary | ICD-10-CM | POA: Diagnosis present

## 2023-05-14 DIAGNOSIS — R918 Other nonspecific abnormal finding of lung field: Secondary | ICD-10-CM | POA: Diagnosis not present

## 2023-05-14 DIAGNOSIS — C7931 Secondary malignant neoplasm of brain: Secondary | ICD-10-CM

## 2023-05-14 LAB — BASIC METABOLIC PANEL
Anion gap: 12 (ref 5–15)
BUN: 16 mg/dL (ref 8–23)
CO2: 26 mmol/L (ref 22–32)
Calcium: 9 mg/dL (ref 8.9–10.3)
Chloride: 96 mmol/L — ABNORMAL LOW (ref 98–111)
Creatinine, Ser: 1.06 mg/dL — ABNORMAL HIGH (ref 0.44–1.00)
GFR, Estimated: 55 mL/min — ABNORMAL LOW (ref >60)
Glucose, Bld: 114 mg/dL — ABNORMAL HIGH (ref 70–99)
Potassium: 5.2 mmol/L — ABNORMAL HIGH (ref 3.5–5.1)
Sodium: 134 mmol/L — ABNORMAL LOW (ref 135–145)

## 2023-05-14 LAB — CBC WITH DIFFERENTIAL/PLATELET
Abs Immature Granulocytes: 0.07 10*3/uL (ref 0.00–0.07)
Basophils Absolute: 0.1 10*3/uL (ref 0.0–0.1)
Basophils Relative: 1 %
Eosinophils Absolute: 0.3 10*3/uL (ref 0.0–0.5)
Eosinophils Relative: 2 %
HCT: 33.7 % — ABNORMAL LOW (ref 36.0–46.0)
Hemoglobin: 10.3 g/dL — ABNORMAL LOW (ref 12.0–15.0)
Immature Granulocytes: 1 %
Lymphocytes Relative: 15 %
Lymphs Abs: 1.7 10*3/uL (ref 0.7–4.0)
MCH: 29.6 pg (ref 26.0–34.0)
MCHC: 30.6 g/dL (ref 30.0–36.0)
MCV: 96.8 fL (ref 80.0–100.0)
Monocytes Absolute: 1 10*3/uL (ref 0.1–1.0)
Monocytes Relative: 9 %
Neutro Abs: 8.4 10*3/uL — ABNORMAL HIGH (ref 1.7–7.7)
Neutrophils Relative %: 72 %
Platelets: 432 10*3/uL — ABNORMAL HIGH (ref 150–400)
RBC: 3.48 MIL/uL — ABNORMAL LOW (ref 3.87–5.11)
RDW: 19.2 % — ABNORMAL HIGH (ref 11.5–15.5)
WBC: 11.4 10*3/uL — ABNORMAL HIGH (ref 4.0–10.5)
nRBC: 0 % (ref 0.0–0.2)

## 2023-05-14 LAB — GLUCOSE, CAPILLARY
Glucose-Capillary: 124 mg/dL — ABNORMAL HIGH (ref 70–99)
Glucose-Capillary: 100 mg/dL — ABNORMAL HIGH (ref 70–99)
Glucose-Capillary: 124 mg/dL — ABNORMAL HIGH (ref 70–99)
Glucose-Capillary: 158 mg/dL — ABNORMAL HIGH (ref 70–99)

## 2023-05-14 MED ORDER — GABAPENTIN 100 MG PO CAPS
100.0000 mg | ORAL_CAPSULE | Freq: Two times a day (BID) | ORAL | Status: DC
  Administered 2023-05-14 – 2023-05-28 (×29): 100 mg via ORAL
  Filled 2023-05-14 (×29): qty 1

## 2023-05-14 MED ORDER — SODIUM CHLORIDE 0.9 % IV SOLN: INTRAVENOUS | Status: DC

## 2023-05-14 MED ORDER — UMECLIDINIUM BROMIDE 62.5 MCG/ACT IN AEPB
1.0000 | INHALATION_SPRAY | Freq: Every day | RESPIRATORY_TRACT | Status: DC
  Administered 2023-05-17 – 2023-05-28 (×12): 1 via RESPIRATORY_TRACT
  Filled 2023-05-14 (×4): qty 7

## 2023-05-14 MED ORDER — LIDOCAINE 5 % EX PTCH
2.0000 | MEDICATED_PATCH | CUTANEOUS | Status: DC
  Administered 2023-05-14 – 2023-05-28 (×15): 2 via TRANSDERMAL
  Filled 2023-05-14 (×13): qty 2

## 2023-05-14 MED ORDER — ACETAMINOPHEN 500 MG PO TABS: 1000.0000 mg | ORAL_TABLET | Freq: Four times a day (QID) | ORAL | Status: DC | PRN

## 2023-05-14 MED ORDER — FOLIC ACID 1 MG PO TABS
1.0000 mg | ORAL_TABLET | Freq: Every day | ORAL | Status: DC
  Administered 2023-05-14 – 2023-05-28 (×15): 1 mg via ORAL
  Filled 2023-05-14 (×15): qty 1

## 2023-05-14 MED ORDER — VITAMIN B-12 1000 MCG PO TABS
1000.0000 ug | ORAL_TABLET | Freq: Every day | ORAL | Status: DC
  Administered 2023-05-14 – 2023-05-27 (×14): 1000 ug via ORAL
  Filled 2023-05-14 (×14): qty 1

## 2023-05-14 NOTE — Progress Notes (Signed)
Madison Ho   DOB:1951-02-25   ZO#:[id number]    ASSESSMENT & PLAN:  72 year old pleasant female with medical history of  type II DM, diabetic neuropathy, venous stasis, chronic left shoulder pain is admitted for fatigue found to have metastatic squamous cell carcinoma from left lung mass biopsy.     05/11/23 oncology consult. Discussed with patient and family there is large left lung mass invading various structures, right adrenal metastases.  Her findings are more consistent with stage IV or metastatic lung cancer.  We discussed metastatic lung cancer is generally not curable, however treatable.  This will depend on patient's performance status as well.  Family reported previously in good function 3 months ago and she has excellent memory.  We discussed the next step will be obtaining MRI of the brain to complete staging.  We will request foundation 1 testing for molecular testing.  Discussed that in general, first step is to look for actionable mutation.  If there is actionable mutation, patient will consider for targeted therapy.  If not, PD-L1 expression can be will be next step evaluate for immunotherapy.  Chemoimmunotherapy can be considered in patient with good functional status without other good options.    Patient and family expressed they all would like to pursue cancer directed treatment.    10/12 MRI brain: Solitary 8 mm intracranial metastasis  Discussed results. She is asymptomatic. There is no cerebral edema. Will consult radiation oncology for evaluation of SRS and palliative XRT.  Updated family including son and daughter.  10/14 reported aspiration like symptoms, somnolent and patient feels weak and performance status seem to be limited.  Discussed goals of care, son was on the phone with daughter at bedside.  Long discussion on goals of care.  Given her physical status is not improving, I am concerning about likelihood of complication from her malignancy, as well as undergoing more  treatment.  Discussed DNR, hospice in the setting if she were to decline physically and not strong enough for cancer directed treatment.  Discussed importance of quality of life and keep her comfortable and not prolong her suffering.  Discussed patient can potentially develop significant complication, expected in and out of the hospital, admission, emergency room throughout the course of treatment.  Daughter said they will discuss among each other and son is not ready for this.   Brain metastasis Solitary. She is asymptomatic. There is no cerebral edema. Hold on start steroid for now. Will consult for SRS   [phone] [facility] of lung MRI of the brain Requesting Foundation 1 testing Consult for palliative radiation Will arrange follow-up with outpatient lung cancer oncologist Dr. Shirline Frees. Palliative care consult   Acute on chronic respiratory failure On 3L. consult for palliative radiation   Normocytic anemia with low B12 and folic deficiency low B12, and folate Start b12 and folic acid     All questions were answered.    Discussed goals of care with patient and family today.  Thank you for the consult. Will follow with you. Melven Sartorius, MD 05/14/2023 4:50 PM  Subjective:  Olana reports feeling tired in general.  Reported feeling weak.  Some more coughing.  No chest pain or abdominal pain.  Objective:  Vitals:   05/14/23 1215 05/14/23 1622  BP: 106/71 (!) 102/56  Pulse: (!) 102   Resp: 17 (!) 24  Temp: 98.8 F (37.1 C) 99.8 F (37.7 C)  SpO2: 91% 91%     Intake/Output Summary (Last 24 hours) at 05/14/2023 1650 Last data  filed at 05/14/2023 0300 Gross per 24 hour  Intake 200 ml  Output --  Net 200 ml    GENERAL: alert, no distress and comfortable SKIN: skin color normal EYES: normal, sclera clear OROPHARYNX: moist NECK: supple, no palpable mass LYMPH:  no palpable cervical, axillary lymphadenopathy LUNGS: bilateral wheeze, no rales and on 3L oxygen.  HEART:  regular rate & rhythm  ABDOMEN: abdomen soft, non-tender and non-distended Musculoskeletal: no lower extremity edema NEURO: alert with fluent speech, no focal motor/sensory deficits Strength and sensation equal bilaterally   Labs:  Recent Labs    05/07/23 2336 05/08/23 0143 05/08/23 0859 05/10/23 0750 05/12/23 0321 05/14/23 0348  NA 136   < > 132* 136 133* 134*  K 4.1   < > 4.0 4.0 4.3 5.2*  CL 103   < > 99 100 99 96*  CO2 17*  --  19* 23 23 26   GLUCOSE 112*   < > 76 137* 128* 114*  BUN 26*   < > 21 13 10 16   CREATININE 1.43*   < > 1.02* 0.93 0.92 1.06*  CALCIUM 9.4  --  8.5* 8.7* 8.7* 9.0  GFRNONAA 39*  --  58* >60 >60 55*  PROT 7.8  --  5.9*  --   --   --   ALBUMIN 2.8*  --  2.1*  --   --   --   AST 24  --  21  --   --   --   ALT 12  --  10  --   --   --   ALKPHOS 55  --  40  --   --   --   BILITOT 0.7  --  0.8  --   --   --    < > = values in this interval not displayed.    Studies:  DG CHEST PORT 1 VIEW  Result Date: 05/13/2023 CLINICAL DATA:  Evaluate pneumonia. EXAM: PORTABLE CHEST 1 VIEW COMPARISON:  05/08/2023 FINDINGS: There has been no significant interval change and abnormal asymmetric opacification of the entire left lung which likely reflects extensive left upper lobe consolidation with veil like opacification over the left lower lobe. This obscures the left heart border. Signs of venous congestion within the right lung noted without focal airspace opacity or signs of right pleural effusion. IMPRESSION: 1. No significant interval change in asymmetric opacification of the entire left lung which likely reflects extensive left upper lobe lung mass with veil like opacification over the left lower lobe. 2. Signs of venous congestion within the right lung. Electronically Signed   By: Signa Kell M.D.   On: 05/13/2023 19:25   MR BRAIN W WO CONTRAST  Result Date: 05/12/2023 CLINICAL DATA:  Initial evaluation for non-small cell lung cancer, staging. EXAM: MRI HEAD  WITHOUT AND WITH CONTRAST TECHNIQUE: Multiplanar, multiecho pulse sequences of the brain and surrounding structures were obtained without and with intravenous contrast. CONTRAST:  8.71mL GADAVIST GADOBUTROL 1 MMOL/ML IV SOLN COMPARISON:  None available. FINDINGS: Brain: Examination degraded by motion artifact. Cerebral volume within normal limits. Patchy T2/FLAIR hyperintensity involving the periventricular deep white matter both cerebral hemispheres, consistent with chronic small vessel ischemic disease, mild-to-moderate in nature. Patchy involvement of the pons noted. Encephalomalacia and gliosis involving the left occipital lobe, consistent with a chronic left PCA distribution infarct. No evidence for acute or subacute ischemia. No acute or chronic intracranial blood products. Single enhancing intraparenchymal mass centered at the anterior/inferior aspect of the left  lentiform nucleus measures 8 mm, consistent with a intracranial metastasis (series 16, image 26). No significant surrounding edema or mass effect. No other visible intracranial metastatic lesions. No other mass lesion, mass effect or midline shift. No hydrocephalus or extra-axial fluid collection. Pituitary gland suprasellar region within normal limits. No other abnormal enhancement. Vascular: Major intracranial vascular flow voids are maintained. Skull and upper cervical spine: Craniocervical junction within normal limits. Bone marrow signal intensity within normal limits. No visible focal marrow replacing lesion. No scalp soft tissue abnormality. Sinuses/Orbits: Prior bilateral ocular lens replacement. Paranasal sinuses are clear. Moderate right mastoid effusion, of uncertain significance. Other: None. IMPRESSION: 1. Solitary 8 mm intracranial metastasis at the anterior/inferior aspect of the left lentiform nucleus. No significant mass effect. 2. Underlying chronic microvascular ischemic disease with chronic left PCA distribution infarct.  Electronically Signed   By: Rise Mu M.D.   On: 05/12/2023 03:47   CT LUNG MASS BIOPSY  Result Date: 05/09/2023 INDICATION: No known primary, now with large left upper lobe mass eroding the left chest wall. Please perform image guided biopsy for tissue diagnostic purposes. EXAM: CT AND ULTRASOUND-GUIDED LEFT UPPER LOBE LUNG MASS COMPARISON:  Chest CT-05/08/2023 MEDICATIONS: None. ANESTHESIA/SEDATION: Moderate (conscious) sedation was employed during this procedure as administered by the Interventional Radiology RN. A total of Versed 1 mg and Fentanyl 75 mcg was administered intravenously. Moderate Sedation Time: 15 minutes. The patient's level of consciousness and vital signs were monitored continuously by radiology nursing throughout the procedure under my direct supervision. CONTRAST:  None. COMPLICATIONS: None immediate. PROCEDURE: Informed consent was obtained from the patient following an explanation of the procedure, risks, benefits and alternatives. A time out was performed prior to the initiation of the procedure. The patient was positioned supine on the CT table and a limited CT was performed for procedural planning demonstrating unchanged size and appearance of the aggressive left upper lobe mass eroding through the left anterolateral chest wall. CT gantry table position was marked and the dominant macrolobulated apical component measuring at least 6.6 x 5.2 cm (image 18, series 2), was identified sonographically. The procedure was planned. The operative site was prepped and draped in the usual sterile fashion. After the overlying soft tissues were anesthetized with 1% lidocaine with epinephrine, a 17 gauge coaxial needle was utilized to access the apically located chest wall component. Multiple ultrasound images were saved procedural documentation purposes. Appropriate position was confirmed with CT imaging (representative image 13, series 3). Next, under direct ultrasound guidance, 6 core  needle biopsies were obtained of the left anterior chest wall mass. Biopsy samples were placed in formalin and submitted for analysis. The coaxial needle was removed and postprocedural CT imaging was obtained in negative for the presence a complication. Specifically, no pneumothorax or sizable hematoma. A dressing was applied. The patient tolerated the procedure well without immediate postprocedural complication. IMPRESSION: Technically successful ultrasound and CT guided core needle biopsy of apical anterior component of left upper lobe/chest wall mass. Electronically Signed   By: Simonne Come M.D.   On: 05/09/2023 14:36   CT CHEST ABDOMEN PELVIS W CONTRAST  Result Date: 05/08/2023 CLINICAL DATA:  Left arm pain and weakness. Left upper lobe mass. Evaluate for metastatic disease. EXAM: CT CHEST, ABDOMEN, AND PELVIS WITH CONTRAST TECHNIQUE: Multidetector CT imaging of the chest, abdomen and pelvis was performed following the standard protocol during bolus administration of intravenous contrast. RADIATION DOSE REDUCTION: This exam was performed according to the departmental dose-optimization program which includes automated exposure control, adjustment  of the mA and/or kV according to patient size and/or use of iterative reconstruction technique. CONTRAST:  75mL OMNIPAQUE IOHEXOL 350 MG/ML SOLN COMPARISON:  Chest radiograph dated 05/08/2023. FINDINGS: CT CHEST FINDINGS Cardiovascular: There is no cardiomegaly or pericardial effusion. Moderate calcified and noncalcified plaque of the thoracic aorta. No aneurysmal dilatation or dissection. The origins of the great vessels of the aortic arch appear patent. There is mass effect and compression of the left main pulmonary artery with high-grade narrowing of the vessel. The central pulmonary arteries remain patent. Mediastinum/Nodes: The esophagus is grossly unremarkable. No mediastinal fluid collection. Lungs/Pleura: Large mass occupying the entire left upper lobe  extending into the left hilum and mediastinum. The mass measures approximately 13 x 17 cm in greatest axial dimensions and 15 cm in craniocaudal length. The mass abuts the descending thoracic aorta and aortic arch. There is encasement of the left main pulmonary artery with mass effect and high-grade narrowing. The mass also abuts the distal trachea and encases the left mainstem bronchus. There is occlusion of the left upper lobe bronchus. Areas of lower attenuation with gas within the mass consistent with necrotic changes. There is invasion of the left chest wall with extension of the mass outside of the intercostal musculature into the left axilla and subpectoral tissues. A 2 x 3 cm lobulated mass in the anterior mediastinum likely contiguous with the left upper lobe mass or represent metastases. Musculoskeletal: There is infiltrative changes of the left 1st-4th ribs. Several old left lower rib fractures. Age indeterminate nondisplaced fracture of the posterior left eighth rib. There is left axillary adenopathy. There is compression of the left supraclinoid vein by the chest wall mass. Age indeterminate, likely chronic compression fracture of T9 with near complete loss of vertebral body height and anterior wedging. Additional old appearing compression fractures of T12 and L1. Correlation with clinical exam and point tenderness recommended. CT ABDOMEN PELVIS FINDINGS No intra-abdominal free air or free fluid. Hepatobiliary: The liver is unremarkable. No biliary dilatation. The gallbladder is unremarkable. Pancreas: Unremarkable. No pancreatic ductal dilatation or surrounding inflammatory changes. Spleen: Normal in size without focal abnormality. Adrenals/Urinary Tract: A 3.2 x 2.2 cm right adrenal nodule most consistent with metastatic disease. Indeterminate 1 cm left adrenal nodule. There is no hydronephrosis on either side. There is symmetric enhancement and excretion of contrast by both kidneys. The visualized  ureters and urinary bladder appear unremarkable. Stomach/Bowel: There is sigmoid diverticulosis with muscular hypertrophy. No active inflammatory changes. There is loose stool in the proximal colon. No bowel obstruction or active inflammation. The appendix is normal. Vascular/Lymphatic: Advanced calcified and noncalcified plaque of the abdominal aorta. The aorta is ectatic measuring 2.5 cm in diameter. The IVC is unremarkable. No portal venous gas. No adenopathy. Reproductive: The uterus is grossly unremarkable. No adnexal masses. Other: None Musculoskeletal: Osteopenia. Chronic appearing L1 compression fracture. IMPRESSION: 1. Large left upper lobe mass as above. Multidisciplinary consult is advised. 2. Right adrenal nodule most consistent with metastatic disease. 3. Age indeterminate, likely chronic compression fracture of T9. Correlation with clinical exam and point tenderness recommended. 4. Sigmoid diverticulosis. No bowel obstruction. Normal appendix. 5.  Aortic Atherosclerosis (ICD10-I70.0). Electronically Signed   By: Elgie Collard M.D.   On: 05/08/2023 03:07   DG Shoulder Left  Result Date: 05/08/2023 CLINICAL DATA:  Worsening shoulder pain EXAM: LEFT SHOULDER - 2+ VIEW COMPARISON:  Radiographs 03/09/2023 FINDINGS: No acute fracture or dislocation of the left shoulder. Age indeterminate left rib fractures. Airspace opacities in the left lung,  see separate details. IMPRESSION: 1. No acute fracture or dislocation of the left shoulder. 2. Age indeterminate left rib fractures. 3. Airspace opacities in the left lung, see separate details. Electronically Signed   By: Minerva Fester M.D.   On: 05/08/2023 00:49   DG Chest 2 View  Result Date: 05/08/2023 CLINICAL DATA:  Weakness EXAM: CHEST - 2 VIEW COMPARISON:  None Available. FINDINGS: Dense consolidation within the a left upper lobe with mass effect resulting in deviation of the trachea to the right. Right lung is clear. No pneumothorax or pleural  effusion. Cardiac size is within normal limits. IMPRESSION: 1. Dense consolidation within the left upper lobe with mass effect resulting in deviation of the trachea to the right. While this may reflect pneumonia, underlying neoplasm is not excluded. CT examination is recommended for further evaluation. Electronically Signed   By: Helyn Numbers M.D.   On: 05/08/2023 00:46

## 2023-05-14 NOTE — Progress Notes (Signed)
PT Cancellation Note  Patient Details Name: Berit Raczkowski MRN: [id number] DOB: 1951/05/28   Cancelled Treatment:    Reason Eval/Treat Not Completed: Fatigue/lethargy limiting ability to participate. Per pt's daughter, she is "too out of it" after pain medications given overnight. Will continue to attempt later in morning.   Vickki Muff, PT, DPT   Acute Rehabilitation Department Office [phone] Secure Chat Communication Preferred   Ronnie Derby 05/14/2023, 8:23 AM

## 2023-05-14 NOTE — Progress Notes (Signed)
MEWS Progress Note  Patient Details Name: Madison Ho MRN: [id number] DOB: 1950/11/08 Today's Date: 05/14/2023   MEWS Flowsheet Documentation:  Assess: MEWS Score Temp: 99.8 F (37.7 C) BP: (!) 102/56 MAP (mmHg): 68 Pulse Rate: (!) 102 ECG Heart Rate: 72 Resp: (!) 24 Level of Consciousness: Alert SpO2: 91 % O2 Device: Nasal Cannula O2 Flow Rate (L/min): 3 L/min (3L/min) Assess: MEWS Score MEWS Temp: 0 MEWS Systolic: 0 MEWS Pulse: 1 MEWS RR: 1 MEWS LOC: 0 MEWS Score: 2 MEWS Score Color: Yellow Assess: SIRS CRITERIA SIRS Temperature : 0 SIRS Respirations : 1 SIRS Pulse: 1 SIRS WBC: 0 SIRS Score Sum : 2 SIRS Temperature : 0 SIRS Pulse: 1 SIRS Respirations : 1 SIRS WBC: 0 SIRS Score Sum : 2 Assess: if the MEWS score is Yellow or Red Were vital signs accurate and taken at a resting state?: Yes Does the patient meet 2 or more of the SIRS criteria?: Yes Does the patient have a confirmed or suspected source of infection?: No MEWS guidelines implemented : Yes, yellow Treat MEWS Interventions: Considered administering scheduled or prn medications/treatments as ordered Take Vital Signs Increase Vital Sign Frequency : Yellow: Q2hr x1, continue Q4hrs until patient remains green for 12hrs Escalate MEWS: Escalate: Yellow: Discuss with charge nurse and consider notifying provider and/or RRT        Kelli Hope 05/14/2023, 5:30 PM

## 2023-05-14 NOTE — Progress Notes (Signed)
  Interdisciplinary Goals of Care Family Meeting   Date carried out: 05/14/2023  Location of the meeting: Bedside  Member's involved: Physician and Family Member or next of kin  Durable Power of Attorney or acting medical decision maker: Unassigned son and daughter  Discussion: We discussed goals of care for [employer] .   We had an extensive conversation at the bedside given yesterday's symptoms of cough in addition to low-grade temp and aspiration like symptoms The patient is a little bit somnolent has been on gabapentin (only intermittently on this previously) as well as oxycodone which we have discontinued and she is not able to completely participate with this discussion I was quite frank with the son and the daughter-looking at the chest x-ray the mass is quite large and on top of that she has aspiration in addition to her new oxygen requirement-I think her performance status is quite poor I do not think she would tolerate aggressive chemo although may benefit from [facility] I have asked palliative care to come by and consulted goals of care today Full note to follow later this afternoon  Code status:   Code Status: Full Code   Disposition: Continue current acute care  Time spent for the meeting: 20      Rhetta Mura, MD  05/14/2023, 7:56 AM

## 2023-05-14 NOTE — Progress Notes (Signed)
Request emailed to pathology technician, Lita Mains, for pt's tissue be sent to [facility] for molecular studies and PD-L1 testing.

## 2023-05-14 NOTE — Progress Notes (Signed)
Physical Therapy Treatment Patient Details Name: Madison Ho MRN: [id number] DOB: 02/10/1951 Today's Date: 05/14/2023   History of Present Illness Patient is 72 y.o. female presented to the [facility] 05/07/23 w/ arm and neck pain for ~2 months with recent weight loss and FTT. Pt found to have sepsis due to UTI, metastatic squamous cell carcinoma from left upper lobe mass with additional R adrenal metastases and 8mm mass at  the anterior/inferior aspect of the left lentiform nucleus. PMH significant for DM II, diabetic neuropathy,  HTN, HLD.    PT Comments  The pt was in bed, awake after bath with NT. Daughter present and continuing to encourage participation in PT session. The pt needed maxA to complete bed mobility and max-modA to complete stand-pivot transfer from EOB to recliner. The pt did voice desire to return to supine multiple times in session and at times needed assist to maintain sitting EOB due to her leaning posteriorly and resisting in attempts to lay back down. With daughter's encouragement, pt did complete transfer to recliner with front-facing HHA transfer. The pt needs maxA to power up to standing and to clear her hips from the bed, but once in crouched standing position, she was able to manage small pivotal steps with modA. VSS on 2L. Pt with increased secretions once in seated position. Will continue to progress with OOB mobility as tolerated and as based on alignment with goals of care discussion planned for later today.     If plan is discharge home, recommend the following: A lot of help with bathing/dressing/bathroom;Assist for transportation;Help with stairs or ramp for entrance;Assistance with cooking/housework;Two people to help with walking and/or transfers   Can travel by private vehicle     No  Equipment Recommendations  None recommended by PT    Recommendations for Other Services       Precautions / Restrictions Precautions Precautions: Fall Restrictions Weight  Bearing Restrictions: No     Mobility  Bed Mobility Overal bed mobility: Needs Assistance Bed Mobility: Rolling, Sidelying to Sit Rolling: Mod assist Sidelying to sit: Max assist       General bed mobility comments: pt with little attempt to assist with bed mobility today, did not move LE to command and did not help with trunk mobility. some initiation of wt shift for scooting to EOB    Transfers Overall transfer level: Needs assistance Equipment used: 1 person hand held assist Transfers: Bed to chair/wheelchair/BSC Sit to Stand: Mod assist, Max assist     Squat pivot transfers: Mod assist, Max assist     General transfer comment: maxA to lift hips from bed, modA to manage small lateral pivotal steps with hips and knees flexed. no buckling. limited endurance.    Ambulation/Gait               General Gait Details: pt declined   Optometrist     Tilt Bed    Modified Rankin (Stroke Patients Only)       Balance Overall balance assessment: Needs assistance Sitting-balance support: Bilateral upper extremity supported Sitting balance-Leahy Scale: Poor Sitting balance - Comments: prefers flexed posture while sitting.   Standing balance support: Bilateral upper extremity supported, During functional activity, Reliant on assistive device for balance Standing balance-Leahy Scale: Poor Standing balance comment: external assistance required  Cognition Arousal: Alert Behavior During Therapy: Flat affect Overall Cognitive Status: Within Functional Limits for tasks assessed                                 General Comments: pt with minimal motivation, asking to return to supine and to rest in bed. daughter present and great at encouraging patient. pt is agreeable but only with max encouragement. pt did direct therapist to move a cord showing good awareness and some initiation.  otherwise remained pretty quiet through session        Exercises      General Comments General comments (skin integrity, edema, etc.): VSS on RA      Pertinent Vitals/Pain Pain Assessment Pain Assessment: Faces Faces Pain Scale: Hurts whole lot Pain Location: generalized pain and increased in LUE Pain Descriptors / Indicators: Sore Pain Intervention(s): Limited activity within patient's tolerance, Monitored during session, Repositioned     PT Goals (current goals can now be found in the care plan section) Acute Rehab PT Goals Patient Stated Goal: per family: to build stregnth for treatment PT Goal Formulation: With patient/family Time For Goal Achievement: 05/17/23 Potential to Achieve Goals: Fair Progress towards PT goals: Progressing toward goals    Frequency    Min 1X/week       AM-PAC PT "6 Clicks" Mobility   Outcome Measure  Help needed turning from your back to your side while in a flat bed without using bedrails?: A Lot Help needed moving from lying on your back to sitting on the side of a flat bed without using bedrails?: A Lot Help needed moving to and from a bed to a chair (including a wheelchair)?: A Lot Help needed standing up from a chair using your arms (e.g., wheelchair or bedside chair)?: A Lot Help needed to walk in hospital room?: Total Help needed climbing 3-5 steps with a railing? : Total 6 Click Score: 10    End of Session Equipment Utilized During Treatment: Gait belt;Oxygen Activity Tolerance: Patient limited by fatigue Patient left: in chair;with call bell/phone within reach;with chair alarm set;with family/visitor present Nurse Communication: Mobility status PT Visit Diagnosis: Muscle weakness (generalized) (M62.81);Unsteadiness on feet (R26.81)     Time: 9147-8295 PT Time Calculation (min) (ACUTE ONLY): 23 min  Charges:    $Therapeutic Exercise: 8-22 mins $Therapeutic Activity: 8-22 mins PT General Charges $$ ACUTE PT VISIT: 1  Visit                     Vickki Muff, PT, DPT   Acute Rehabilitation Department Office [phone] Secure Chat Communication Preferred   Ronnie Derby 05/14/2023, 12:21 PM

## 2023-05-14 NOTE — Progress Notes (Signed)
OT Cancellation Note  Patient Details Name: Madison Ho MRN: [id number] DOB: 27-Jan-1951   Cancelled Treatment:    Reason Eval/Treat Not Completed: Fatigue/lethargy limiting ability to participate (Patient too lethargic to particicpate in OT session this date.  Will re-attempt as able)  Denice Paradise 05/14/2023, 10:27 AM

## 2023-05-14 NOTE — Progress Notes (Signed)
HOSPITALIST ROUNDING NOTE Madison Ho WUJ:[id number]  DOB: [birth year]  DOA: 05/07/2023  PCP: Ignatius Specking, MD  05/14/2023,7:58 AM   LOS: 6 days      Code Status: Presumed full From: Home  current Dispo: Unclear    72 year old home dwelling female HTN HLD DM TY 2 BMI 30 Prior smoker until about 2 months ago Came to emergency room 10/8 with left shoulder pain 40 pound weight loss failure to thrive low-grade fever temp tachycardia and hypotension responsive to IV fluid Urine culture not obtained at admission blood culture X210/8 negative to date CT scan = large left lung mass concerning for primary lung malignancy- 10/9 biopsy left upper lobe showing metastatic squamous cell CA  10/11 oncology consult 10-CT 4N2M1 SCC lung Ordered MRI brain foundation 1 testing and recommended palliative care as performance status borderline  10/13 p.m., coughing with liquids SLP engaged-kept n.p.o.-started antibiotics x-ray relatively unchanged  Plan  Presumed sepsis on admission 2/2 UTI? urine cultures not collected-[place] below discussion  ?  Aspiration superimposed on CT 4N2M1 SCC metastatic lung cancer with mets to right adrenal, brain Not convinced she has pneumonia although she is aspirating--We will continue empiric Rocephin for another day and then stop as not convinced again that this is pneumonia based on x-ray performed 10/13 Poor performance status at baseline-long discussions with Dr. Cherly Hensen oncology previously Potentially XRT Homero Fellers discussion as per Ipal note-I do not think she is a good candidate based on her current performance status for aggressive chemo--- does not require AED or steroids for brain mets I have asked palliative care to come by  COPD smoker till recently acute respiratory failure superimposed on her COPD ?  Palliative care periodic inhalers, add Spiriva - Chronic left shoulder pain Would stop gabapentin completely with stop oxycodone completely as she is a little bit  sleepy Only use lidocaine patches and high-dose Tylenol 1000 every 6 as needed pain  AKI hyperkalemia  Hyperkalemia has worsened-saline started 40 cc/H-check labs in the morning Azotemia has worsened that she is n.p.o.-check labs in the morning   chronic compression fracture T9 Pain it is causing encephalopathy Only Cane patch now-see above   DM TY 2  obesity BMI 32  metformin held at this time-may resume at discharge  Discussed extensively with son and daughter at the bedside-they understand the implications and are worried about her performance status additionally I do not know if she would have good quality of life We await palliative care  DVT prophylaxis: Heparin  Status is: Inpatient Remains inpatient appropriate because:   Eventual skilled?  Hospice?   Subjective:  Sleepy this morning cannot really participate much with interaction See above  Objective + exam Vitals:   05/13/23 1814 05/14/23 0100 05/14/23 0500 05/14/23 0600  BP: 98/71 (!) 103/54  98/71  Pulse: (!) 115 (!) 106  (!) 106  Resp: 20 18  18   Temp: 98.7 F (37.1 C) 98.7 F (37.1 C)  98.7 F (37.1 C)  TempSrc: Oral Oral  Oral  SpO2:  90%  90%  Weight:   87.8 kg   Height:       Filed Weights   05/12/23 0500 05/13/23 0500 05/14/23 0500  Weight: 83.4 kg 87.7 kg 87.8 kg    Examination:  Cohere x 1 Chest is clear anteriorly Mild sinus tach Abdomen soft no rebound Restricted ROM left shoulder with painful bicipital groove Further manual muscle testing deferred Abdomen soft no rebound   Data Reviewed: reviewed   CBC  Component Value Date/Time   WBC 11.4 (H) 05/14/2023 0348   RBC 3.48 (L) 05/14/2023 0348   HGB 10.3 (L) 05/14/2023 0348   HCT 33.7 (L) 05/14/2023 0348   PLT 432 (H) 05/14/2023 0348   MCV 96.8 05/14/2023 0348   MCH 29.6 05/14/2023 0348   MCHC 30.6 05/14/2023 0348   RDW 19.2 (H) 05/14/2023 0348   LYMPHSABS 1.7 05/14/2023 0348   MONOABS 1.0 05/14/2023 0348   EOSABS 0.3  05/14/2023 0348   BASOSABS 0.1 05/14/2023 0348      Latest Ref Rng & Units 05/14/2023    3:48 AM 05/12/2023    3:21 AM 05/10/2023    7:50 AM  CMP  Glucose 70 - 99 mg/dL 573  220  254   BUN 8 - 23 mg/dL 16  10  13    Creatinine 0.44 - 1.00 mg/dL 2.70  6.23  7.62   Sodium 135 - 145 mmol/L 134  133  136   Potassium 3.5 - 5.1 mmol/L 5.2  4.3  4.0   Chloride 98 - 111 mmol/L 96  99  100   CO2 22 - 32 mmol/L 26  23  23    Calcium 8.9 - 10.3 mg/dL 9.0  8.7  8.7      Scheduled Meds:  feeding supplement  237 mL Oral BID BM   gabapentin  100 mg Oral BID   heparin  5,000 Units Subcutaneous Q8H   lidocaine  2 patch Transdermal Q24H   polyethylene glycol  17 g Oral Daily   senna-docusate  1 tablet Oral QHS   Continuous Infusions:  sodium chloride      Time  25  Rhetta Mura, MD  [facility]

## 2023-05-14 NOTE — TOC Initial Note (Addendum)
Transition of Care [facility]) - Initial/Assessment Note    Patient Details  Name: Madison Ho MRN: [id number] Date of Birth: 01-20-1951  Transition of [facility]) CM/SW Contact:    Delilah Shan, LCSWA Phone Number: 05/14/2023, 12:33 PM  Clinical Narrative:                   CSW received consult for possible SNF placement at time of discharge. CSW spoke with patient and patients daughter regarding PT recommendation of SNF placement at time of discharge. Patient reports PTA she comes from home with daughter. Patient expressed understanding of PT recommendation and is agreeable to SNF placement at time of discharge. Patient gave CSW permission to fax out initial referral near the [facility] area for possible SNF placement.CSW discussed insurance authorization process and will provide Medicare SNF ratings list with accepted SNF bed offers when available.  No further questions reported at this time. CSW following to start insurance authorization closer to patient being medically ready.CSW to continue to follow and assist with discharge planning needs.   Expected Discharge Plan: Skilled Nursing Facility Barriers to Discharge: Continued Medical Work up   Patient Goals and CMS Choice Patient states their goals for this hospitalization and ongoing recovery are:: SNF CMS Medicare.gov Compare Post Acute Care list provided to:: Patient (patient and daughter) Choice offered to / list presented to : Patient, Adult Children (Patient and daughter)      Expected Discharge Plan and Services In-house Referral: Clinical Social Work Discharge Planning Services: CM Consult Post Acute Care Choice: Home Health Living arrangements for the past 2 months: Single Family Home                 DME Arranged: N/A         HH Arranged: PT HH Agency: [facility] Date HH Agency Contacted: 05/11/23 Time HH Agency Contacted: 1145 Representative spoke with at [facility] Agency: Tresa Endo  Prior Living  Arrangements/Services Living arrangements for the past 2 months: Single Family Home Lives with:: Adult Children (patient lives with daughter) Patient language and need for interpreter reviewed:: Yes Do you feel safe going back to the place where you live?: No   SNF  Need for Family Participation in Patient Care: Yes (Comment) Care giver support system in place?: Yes (comment) Current home services: DME Criminal Activity/Legal Involvement Pertinent to Current Situation/Hospitalization: No - Comment as needed  Activities of Daily Living   ADL Screening (condition at time of admission) Independently performs ADLs?: No Does the patient have a NEW difficulty with bathing/dressing/toileting/self-feeding that is expected to last >3 days?: Yes (Initiates electronic notice to provider for possible OT consult) (unable for last 6 weeks) Does the patient have a NEW difficulty with getting in/out of bed, walking, or climbing stairs that is expected to last >3 days?: Yes (Initiates electronic notice to provider for possible PT consult) (unable for last 6 weeks) Does the patient have a NEW difficulty with communication that is expected to last >3 days?: No Is the patient deaf or have difficulty hearing?: No Does the patient have difficulty seeing, even when wearing glasses/contacts?: No Does the patient have difficulty concentrating, remembering, or making decisions?: No  Permission Sought/Granted Permission sought to share information with : Case Manager, Family Supports, Oceanographer granted to share information with : Yes, Verbal Permission Granted  Share Information with NAME: Tammy  Permission granted to share info w AGENCY: SNF  Permission granted to share info w Relationship: daughter  Permission granted to  share info w Contact Information: Babette Relic [phone]  Emotional Assessment Appearance:: Appears stated age Attitude/Demeanor/Rapport: Gracious Affect  (typically observed): Calm Orientation: : Oriented to Self, Oriented to Place, Oriented to  Time, Oriented to Situation Alcohol / Substance Use: Not Applicable Psych Involvement: No (comment)  Admission diagnosis:  AKI (acute kidney injury) (HCC) [N17.9] Fatigue, unspecified type [R53.83] Mass of thoracic structure [R22.2] Patient Active Problem List   Diagnosis Date Noted   Normocytic anemia 05/13/2023   Metastasis to brain (HCC) 05/12/2023   Non-small cell lung cancer metastatic to adrenal gland (HCC) 05/11/2023   AKI (acute kidney injury) (HCC) 05/08/2023   Mass of upper lobe of left lung 05/08/2023   Sepsis (HCC) 05/08/2023   UTI (urinary tract infection) 05/08/2023   Hypomagnesemia 05/08/2023   Transient hypotension 05/08/2023   Left shoulder pain 05/08/2023   Compression fracture of body of thoracic vertebra (HCC) 05/08/2023   Tobacco abuse 05/08/2023   Debility 05/08/2023   Mediastinal mass 05/08/2023   Mass of left lung 05/08/2023   DNR (do not resuscitate) discussion 05/08/2023   Goals of care, counseling/discussion 05/08/2023   Nail fungus 05/27/2020   Generalized joint pain 05/27/2020   Acute conjunctivitis of left eye 05/27/2020   Yeast dermatitis 03/18/2020   Type 2 diabetes mellitus with diabetic neuropathy, without long-term current use of insulin (HCC) 03/18/2020   Venous stasis ulcer of right calf (HCC) 12/17/2019   Morbid obesity (HCC) 08/30/2019   Hyperlipidemia associated with type 2 diabetes mellitus (HCC) 08/30/2019   Diabetic neuropathy (HCC) 08/30/2019   Essential hypertension 08/30/2019   Vitamin D deficiency 08/30/2019   PCP:  Ignatius Specking, MD Pharmacy:   [facility] - [place], [place] - [address] [place] [address] Phone: [phone] Fax: [phone]  [facility] - [place], [place] - [address] [place] [address] Phone: [phone] Fax: [phone]  [facility] [place], [place] - [address] [place] [address] Phone: [phone] Fax: [phone]     Social Determinants of Health (SDOH) Social History: SDOH Screenings   Food Insecurity: No Food Insecurity (05/08/2023)  Housing: Low Risk  (05/08/2023)  Transportation Needs: No Transportation Needs (05/08/2023)  Utilities: Not At Risk (05/08/2023)  Alcohol Screen: Low Risk  (11/10/2020)  Depression (PHQ2-9): Low Risk  (11/10/2020)  Financial Resource Strain: Low Risk  (11/10/2020)  Physical Activity: Inactive (11/10/2020)  Social Connections: Moderately Isolated (11/10/2020)  Stress: No Stress Concern Present (11/10/2020)  Tobacco Use: High Risk (05/07/2023)   SDOH Interventions:     Readmission Risk Interventions     No data to display

## 2023-05-14 NOTE — NC FL2 (Signed)
[place] MEDICAID FL2 LEVEL OF CARE FORM     IDENTIFICATION  Patient Name: Madison Ho Birthdate: 1950-10-24 Sex: female Admission Date (Current Location): 05/07/2023  [facility] and [place] Number:  Producer, television/film/video and Address:  [facility] [place], [address], [place], [place] [address]      Provider Number: [id number]  Attending Physician Name and Address:  Rhetta Mura, MD  Relative Name and Phone Number:  Babette Relic (daughter) [phone]    Current Level of Care: Hospital Recommended Level of Care: Skilled Nursing Facility Prior Approval Number:    Date Approved/Denied:   PASRR Number: [id number]  Discharge Plan: SNF    Current Diagnoses: Patient Active Problem List   Diagnosis Date Noted   Normocytic anemia 05/13/2023   Metastasis to brain (HCC) 05/12/2023   Non-small cell lung cancer metastatic to adrenal gland (HCC) 05/11/2023   AKI (acute kidney injury) (HCC) 05/08/2023   Mass of upper lobe of left lung 05/08/2023   Sepsis (HCC) 05/08/2023   UTI (urinary tract infection) 05/08/2023   Hypomagnesemia 05/08/2023   Transient hypotension 05/08/2023   Left shoulder pain 05/08/2023   Compression fracture of body of thoracic vertebra (HCC) 05/08/2023   Tobacco abuse 05/08/2023   Debility 05/08/2023   Mediastinal mass 05/08/2023   Mass of left lung 05/08/2023   DNR (do not resuscitate) discussion 05/08/2023   Goals of care, counseling/discussion 05/08/2023   Nail fungus 05/27/2020   Generalized joint pain 05/27/2020   Acute conjunctivitis of left eye 05/27/2020   Yeast dermatitis 03/18/2020   Type 2 diabetes mellitus with diabetic neuropathy, without long-term current use of insulin (HCC) 03/18/2020   Venous stasis ulcer of right calf (HCC) 12/17/2019   Morbid obesity (HCC) 08/30/2019   Hyperlipidemia associated with type 2 diabetes mellitus (HCC) 08/30/2019   Diabetic neuropathy (HCC) 08/30/2019   Essential hypertension  08/30/2019   Vitamin D deficiency 08/30/2019    Orientation RESPIRATION BLADDER Height & Weight     Self, Time, Situation, Place  O2 (Nasal Cannula 3 liters) Continent Weight: 193 lb 9 oz (87.8 kg) Height:  5\' 5"  (165.1 cm)  BEHAVIORAL SYMPTOMS/MOOD NEUROLOGICAL BOWEL NUTRITION STATUS      Continent (WDL) Diet (Please see discharge summary)  AMBULATORY STATUS COMMUNICATION OF NEEDS Skin   Extensive Assist Verbally Other (Comment) (Wound/Incision LDAs,Incision Closed,Chest,L,Upper)                       Personal Care Assistance Level of Assistance  Bathing, Dressing, Feeding Bathing Assistance: Maximum assistance Feeding assistance: Limited assistance Dressing Assistance: Maximum assistance     Functional Limitations Info  Sight, Hearing, Speech Sight Info: Adequate (WDL) Hearing Info: Adequate Speech Info: Adequate    SPECIAL CARE FACTORS FREQUENCY  PT (By licensed PT), OT (By licensed OT)     PT Frequency: 5x min weekly OT Frequency: 5x min weekly            Contractures Contractures Info: Not present    Additional Factors Info  Code Status, Allergies Code Status Info: FULL Allergies Info: Penicillins           Current Medications (05/14/2023):  This is the current hospital active medication list Current Facility-Administered Medications  Medication Dose Route Frequency Provider Last Rate Last Admin   0.9 %  sodium chloride infusion   Intravenous Continuous Rhetta Mura, MD 40 mL/hr at 05/14/23 1021 New Bag at 05/14/23 1021   acetaminophen (TYLENOL) tablet 1,000 mg  1,000 mg  Oral Q6H PRN Rhetta Mura, MD       albuterol (PROVENTIL) (2.5 MG/3ML) 0.083% nebulizer solution 2.5 mg  2.5 mg Nebulization Q2H PRN Zierle-Ghosh, Asia B, DO       feeding supplement (ENSURE ENLIVE / ENSURE PLUS) liquid 237 mL  237 mL Oral BID BM Smith, Rondell A, MD   237 mL at 05/14/23 1019   gabapentin (NEURONTIN) capsule 100 mg  100 mg Oral BID Rhetta Mura, MD   100 mg at 05/14/23 1019   heparin injection 5,000 Units  5,000 Units Subcutaneous Q8H Anders Grant C, NP   5,000 Units at 05/14/23 0458   lidocaine (LIDODERM) 5 % 2 patch  2 patch Transdermal Q24H Rhetta Mura, MD   2 patch at 05/14/23 1019   ondansetron (ZOFRAN) tablet 4 mg  4 mg Oral Q6H PRN Zierle-Ghosh, Asia B, DO       Or   ondansetron (ZOFRAN) injection 4 mg  4 mg Intravenous Q6H PRN Zierle-Ghosh, Asia B, DO       polyethylene glycol (MIRALAX / GLYCOLAX) packet 17 g  17 g Oral Daily Rhetta Mura, MD   17 g at 05/14/23 1019   senna-docusate (Senokot-S) tablet 1 tablet  1 tablet Oral QHS Rhetta Mura, MD   1 tablet at 05/13/23 2223   triamcinolone 0.1 % cream : eucerin cream, 1:1   Topical TID PRN Steffanie Rainwater, MD   0.1 Application at 05/11/23 2253   umeclidinium bromide (INCRUSE ELLIPTA) 62.5 MCG/ACT 1 puff  1 puff Inhalation Daily Rhetta Mura, MD         Discharge Medications: Please see discharge summary for a list of discharge medications.  Relevant Imaging Results:  Relevant Lab Results:   Additional Information [id number]  Delilah Shan, LCSWA

## 2023-05-14 NOTE — Progress Notes (Addendum)
   05/14/23 1302  Mobility  Activity Transferred from chair to bed  Level of Assistance Maximum assist, patient does 25-49%  Assistive Device Other (Comment) (HHA)  Activity Response Tolerated fair  Mobility Referral Yes  $Mobility charge 1 Mobility  Mobility Specialist Start Time (ACUTE ONLY) 1238  Mobility Specialist Stop Time (ACUTE ONLY) 1302  Mobility Specialist Time Calculation (min) (ACUTE ONLY) 24 min   Mobility Specialist: Progress Note  Post-Mobility: HR 100-108, SpO2 85-90% 3LO2  Pt agreeable to mobility session - received in chair. Required max assist for transfer using RW to total assist for bed mobility. Pt wheezing and not very communicative in response to questions during session. Returned to bed with all needs met - call bell within reach. NT present during session  Pt stated "I'm too mean to die."  Barnie Mort, BS Mobility Specialist Please contact via SecureChat or Rehab office at [phone].

## 2023-05-14 NOTE — Progress Notes (Signed)
SLP Cancellation Note  Patient Details Name: Madison Ho MRN: [id number] DOB: 1950/08/05   Cancelled treatment:       Reason Eval/Treat Not Completed: Patient's level of consciousness. Pt was scheduled for MBS today, but has been lethargic and poorly participatory. Will await palliative discussion for plan and schedule for another day if needed.    Chijioke Lasser, Riley Nearing 05/14/2023, 9:51 AM

## 2023-05-14 NOTE — Consult Note (Signed)
Consultation Note Date: 05/14/2023   Patient Name: Madison Ho  DOB: [birth year]  MRN: [id number]  Age / Sex: 72 y.o., female  PCP: Ignatius Specking, MD Referring Physician: Rhetta Mura, MD  Reason for Consultation: Establishing goals of care  HPI/Patient Profile: 72 y.o. female  with past medical history of hypertension, hyperlipidemia, diabetes, neuropathy, obesity admitted on 05/07/2023 with significant weight loss, weakness, left shoulder pain and left chest swelling. Diagnosed with potential UTI and aspiration pneumonia but also with metastatic lung care to right adrenal, brain. Tentative plans for SRS radiation to brain but with ongoing decline will reassess goals of care before proceeding with treatment.   Clinical Assessment and Goals of Care: Consult received and chart review completed. Discussed with Dr. Mahala Menghini. Complicated hospitalization and concern for path forward. I met today with Ms. Haupt along with her daughter, Babette Relic, at bedside. Tammy was able to get her brother, Marolyn Haller, over phone for discussion. Ms. Haft was present but was fatigued and did not fully participate in conversation. She did make comments at times "I might as well be dead." Family reports that it is not unusual for her to say things like this when she does not want to deal with conversations or situations. Ms. Trojanowski does tell me that she wishes to pursue treatment for her cancer. We did spend time reviewing how significantly progressed her cancer is and the risks vs benefits of aggressive treatment. We discussed goals of treatment to maintain cancer progression but will not cure or make the cancer go away. We discussed the burden of treatment side effects and effect on quality of life. I encouraged the importance of ongoing conversation to ensure that what we are doing is still the best option for Ms. Gerhart.   I  attempted to discussed code status. Len shares frustration that everyone who comes in talks about DNR. I apologized and explained that sometimes I discuss DNR and nobody has brought this up or clarified so I always address this with each patient I see. They share that they will need to speak further as a family about code status. I spoke privately with children as staff assisted Ms. Kam back to bed. I further explained why code status is being brought up repeatedly due to concern for the extent of tumor burden and high risk of decline as well as overall poor prognosis. I did explain that I worry if Ms. Rivero went through a resuscitative event that I would not expect her to survive and I worry this would only provide her with more pain and suffering at the end of her life. Family will continue to discuss.   Len shares that his mother is the strongest woman he knows. We discussed the hopes of nutrition and physical therapy to help her optimize her functional status but the reality of her health situation with cancer will make this challenging. Tammy does not believe her mother is afraid to die but worried more about her family. Tammy tries to share with her mother that she  will be fine.   All questions/concerns addressed. Emotional support provided.   Primary Decision Maker PATIENT - surrogate decision makers are her two adult children    SUMMARY OF RECOMMENDATIONS   - Ongoing palliative discussions and follow up needed  Code Status/Advance Care Planning: Full code - family discussing   Symptom Management:  Pain:  Continue lidoderm patch. Continue gabapentin - may titrate slowly as tolerated.  Consider Tylenol 1000 mg q8h scheduled. Consider Toradol PRN.  Consider OxyIR 2.5 mg q6h PRN.  Could consider steroid trial for pain.  Bowel Regimen: Senokot at bedtime. May increase to BID or 2 tabs as needed.   Prognosis:  Overall prognosis poor.   Discharge Planning: Skilled Nursing  Facility for rehab with Palliative care service follow-up. Referral made to Willette Alma, NP palliative at [facility].       Primary Diagnoses: Present on Admission:  AKI (acute kidney injury) (HCC)  Mass of upper lobe of left lung  Sepsis (HCC)  UTI (urinary tract infection)  Hypomagnesemia  Transient hypotension  Left shoulder pain  Hyperlipidemia associated with type 2 diabetes mellitus (HCC)  Compression fracture of body of thoracic vertebra (HCC)  Tobacco abuse  Debility   I have reviewed the medical record, interviewed the patient and family, and examined the patient. The following aspects are pertinent.  Past Medical History:  Diagnosis Date   Diabetes mellitus without complication (HCC)    Encounter for hepatitis C screening test for low risk patient 08/30/2019   Encounter for imaging to assess osteoporosis 08/30/2019   Encounter for screening mammogram for malignant neoplasm of breast 08/30/2019   Hyperlipidemia    Hypertension    Type 2 diabetes mellitus with neurological complications (HCC) 08/30/2019   Social History   Socioeconomic History   Marital status: Widowed    Spouse name: Not on file   Number of children: 2   Years of education: Not on file   Highest education level: Bachelor's degree (e.g., BA, AB, BS)  Occupational History   Not on file  Tobacco Use   Smoking status: Every Day   Smokeless tobacco: Never  Substance and Sexual Activity   Alcohol use: Yes    Comment: occas   Drug use: Never   Sexual activity: Not Currently  Other Topics Concern   Not on file  Social History Narrative   Lives with Tammy recently moved from [place] in nov 2020   Grandson and her son       Cats      Enjoy: crochet, play merge dragons phone apps game       Diet: loves seafood, eggs, veggies, chicken and sides, limited fast food and red meat   Caffeine: 2 pots of coffee daily, drink a lot of sweet tea, (root beer)   Water: 3 cups daily at least       Wears  seat belt   Smoke and carbon monoxide detectors    Does not drive   Social Determinants of Health   Financial Resource Strain: Low Risk  (11/10/2020)   Overall Financial Resource Strain (CARDIA)    Difficulty of Paying Living Expenses: Not hard at all  Food Insecurity: No Food Insecurity (05/08/2023)   Hunger Vital Sign    Worried About Running Out of Food in the Last Year: Never true    Ran Out of Food in the Last Year: Never true  Transportation Needs: No Transportation Needs (05/08/2023)   PRAPARE - Administrator, Civil Service (Medical):  No    Lack of Transportation (Non-Medical): No  Physical Activity: Inactive (11/10/2020)   Exercise Vital Sign    Days of Exercise per Week: 0 days    Minutes of Exercise per Session: 0 min  Stress: No Stress Concern Present (11/10/2020)   [employer] of Occupational Health - Occupational Stress Questionnaire    Feeling of Stress : Not at all  Social Connections: Moderately Isolated (11/10/2020)   Social Connection and Isolation Panel [NHANES]    Frequency of Communication with Friends and Family: More than three times a week    Frequency of Social Gatherings with Friends and Family: More than three times a week    Attends Religious Services: 1 to 4 times per year    Active Member of [employer] or Organizations: No    Attends Banker Meetings: Never    Marital Status: Widowed   Family History  Problem Relation Age of Onset   Stroke Mother    Heart attack Mother    Heart attack Father    Scheduled Meds:  feeding supplement  237 mL Oral BID BM   gabapentin  100 mg Oral BID   heparin  5,000 Units Subcutaneous Q8H   lidocaine  2 patch Transdermal Q24H   polyethylene glycol  17 g Oral Daily   senna-docusate  1 tablet Oral QHS   umeclidinium bromide  1 puff Inhalation Daily   Continuous Infusions:  sodium chloride 40 mL/hr at 05/14/23 1021   PRN Meds:.acetaminophen, albuterol, ondansetron **OR** ondansetron  (ZOFRAN) IV, triamcinolone 0.1 % cream : eucerin Allergies  Allergen Reactions   Penicillins Swelling    Has taken cipro with no problems   Review of Systems  Unable to perform ROS: Acuity of condition    Physical Exam Vitals and nursing note reviewed.  Constitutional:      Appearance: She is ill-appearing.     Comments: Restless - uncomfortable in chair.   Cardiovascular:     Rate and Rhythm: Tachycardia present.  Pulmonary:     Effort: No tachypnea, accessory muscle usage or respiratory distress.  Abdominal:     Palpations: Abdomen is soft.  Neurological:     Mental Status: She is alert.     Comments: Fluctuating alertness and cognition.      Vital Signs: BP 107/61 (BP Location: Right Arm)   Pulse (!) 109   Temp 99.1 F (37.3 C) (Oral)   Resp 16   Ht 5\' 5"  (1.651 m)   Wt 87.8 kg   SpO2 92%   BMI 32.21 kg/m  Pain Scale: 0-10 POSS *See Group Information*: 1-Acceptable,Awake and alert Pain Score: Asleep   SpO2: SpO2: 92 % O2 Device:SpO2: 92 % O2 Flow Rate: .O2 Flow Rate (L/min): 3 L/min (3L/min)  IO: Intake/output summary:  Intake/Output Summary (Last 24 hours) at 05/14/2023 1151 Last data filed at 05/14/2023 0300 Gross per 24 hour  Intake 200 ml  Output --  Net 200 ml    LBM: Last BM Date : 05/09/23 Baseline Weight: Weight: 100 kg Most recent weight: Weight: 87.8 kg     Palliative Assessment/Data:    Time Total: 85 min  Greater than 50%  of this time was spent counseling and coordinating care related to the above assessment and plan.  Signed by: [name], NP Palliative Medicine Team Pager # [phone] (M-F 8a-5p) Team Phone # [phone] (Nights/Weekends)

## 2023-05-14 NOTE — Plan of Care (Signed)
  Problem: Pain Managment: Goal: General experience of comfort will improve Outcome: Progressing   Problem: Safety: Goal: Ability to remain free from injury will improve Outcome: Progressing   

## 2023-05-14 NOTE — Progress Notes (Signed)
   05/14/23 0600  Assess: MEWS Score  Temp 98.7 F (37.1 C)  BP 98/71  MAP (mmHg) 81  Pulse Rate (!) 106  Resp 18  Level of Consciousness Alert  SpO2 90 %  O2 Device Nasal Cannula  O2 Flow Rate (L/min) 3 L/min (3L/min)  Assess: MEWS Score  MEWS Temp 0  MEWS Systolic 1  MEWS Pulse 1  MEWS RR 0  MEWS LOC 0  MEWS Score 2  MEWS Score Color Yellow  Assess: if the MEWS score is Yellow or Red  Were vital signs accurate and taken at a resting state? Yes  Does the patient meet 2 or more of the SIRS criteria? No  MEWS guidelines implemented  Yes, yellow  Treat  MEWS Interventions Considered administering scheduled or prn medications/treatments as ordered  Take Vital Signs  Increase Vital Sign Frequency  Yellow: Q2hr x1, continue Q4hrs until patient remains green for 12hrs  Escalate  MEWS: Escalate Yellow: Discuss with charge nurse and consider notifying provider and/or RRT  Notify: Charge Nurse/RN  Name of Charge Nurse/RN Notified [facility]  Provider Notification  Provider Name/Title [place],MD  Date Provider Notified 05/14/23  Time Provider Notified 0630  Method of Notification Page  Notification Reason Change in status  Provider response No new orders  Assess: SIRS CRITERIA  SIRS Temperature  0  SIRS Pulse 1  SIRS Respirations  0  SIRS WBC 0  SIRS Score Sum  1

## 2023-05-15 ENCOUNTER — Ambulatory Visit
Admit: 2023-05-15 | Discharge: 2023-05-15 | Disposition: A | Discharge status: Home / Self Care | Payer: Medicare PPO | Attending: Radiation Oncology | Admitting: Radiation Oncology

## 2023-05-15 DIAGNOSIS — C7931 Secondary malignant neoplasm of brain: Secondary | ICD-10-CM

## 2023-05-15 DIAGNOSIS — Z515 Encounter for palliative care: Secondary | ICD-10-CM | POA: Diagnosis not present

## 2023-05-15 DIAGNOSIS — R918 Other nonspecific abnormal finding of lung field: Secondary | ICD-10-CM | POA: Diagnosis not present

## 2023-05-15 DIAGNOSIS — C3490 Malignant neoplasm of unspecified part of unspecified bronchus or lung: Secondary | ICD-10-CM | POA: Diagnosis not present

## 2023-05-15 DIAGNOSIS — Z7189 Other specified counseling: Secondary | ICD-10-CM | POA: Diagnosis not present

## 2023-05-15 LAB — GLUCOSE, CAPILLARY
Glucose-Capillary: 124 mg/dL — ABNORMAL HIGH (ref 70–99)
Glucose-Capillary: 136 mg/dL — ABNORMAL HIGH (ref 70–99)
Glucose-Capillary: 103 mg/dL — ABNORMAL HIGH (ref 70–99)

## 2023-05-15 MED ORDER — ACETAMINOPHEN 500 MG PO TABS
1000.0000 mg | ORAL_TABLET | Freq: Three times a day (TID) | ORAL | Status: DC
  Administered 2023-05-15 – 2023-05-28 (×39): 1000 mg via ORAL
  Filled 2023-05-15 (×39): qty 2

## 2023-05-15 NOTE — Progress Notes (Signed)
   05/15/23 1242  Mobility  Activity Transferred from chair to bed  Level of Assistance Maximum assist, patient does 25-49% (+2)  Assistive Device Other (Comment) (HHA)  Activity Response Tolerated fair  Mobility Referral Yes  $Mobility charge 1 Mobility  Mobility Specialist Start Time (ACUTE ONLY) 1220  Mobility Specialist Stop Time (ACUTE ONLY) 1240  Mobility Specialist Time Calculation (min) (ACUTE ONLY) 20 min   Mobility Specialist: Progress Note  Post-Mobility: SpO2 90% 3L  Pt agreeable to mobility session - received in chair. Required MaxA+2 throughout using HHA. C/o "feeling dead." Returned to bed with all needs met - call bell within reach. Left with NT in room.  Pt was able to pull to herself up in with assistance.   Barnie Mort, BS Mobility Specialist Please contact via SecureChat or Rehab office at [phone].

## 2023-05-15 NOTE — Plan of Care (Signed)

## 2023-05-15 NOTE — Progress Notes (Signed)
Pt received back from [facility] for radiation onc. Calendar placed in drawer for schedule to [facility] and paperwork for [facility] placed in drawer. We will need a new Medical Necessity form each time pt is transported.

## 2023-05-15 NOTE — Consult Note (Signed)
Radiation Oncology         [phone] ________________________________  Name: Madison Ho        MRN: [id number]  Date of Service: 05/15/23        DOB: 05-Sep-1950  OZ:HYQM, Angelina Pih, MD    REFERRING PHYSICIAN: Dr. Cherly Hensen   DIAGNOSIS: The primary encounter diagnosis was Mass of thoracic structure. Diagnoses of Fatigue, unspecified type, Normocytic anemia, Folate deficiency, Low serum vitamin B12, and Metastasis to brain [facility]) were also pertinent to this visit.   HISTORY OF PRESENT ILLNESS: Madison Ho is a 72 y.o. female seen at the request of Dr. Cherly Hensen for a newly diagnosed metastatic lung cancer.  The patient presented on 05/07/2023 to the emergency department complaining of shoulder pain that has been progressively worsening over the last 6 to 8 weeks and swelling in the left anterior chest.  She was being evaluated in [facility] by orthopedics and the provider seeing her recommended that she go be evaluated in the emergency department.  In the last month or so the patient had lost up to 50 pounds with no appetite and is starting to feel unsteady on her feet and weak.  In the emergency department she was hypotensive and a chest x-ray showed dense consolidation in the left upper lobe with a mass effect resulting in deviation of the trachea to the right. .  Further imaging of the chest abdomen and pelvis on 05/08/2023 showed a large mass occupying the entire left upper lobe and extending into the left hilum and mediastinum measuring 17 cm in greatest dimension it abuts the descending thoracic aorta and aortic arch and encases the left main pulmonary artery with mass effect and high-grade narrowing the mass also abuts the distal trachea and encases the left mainstem bronchus with occlusion of the left upper lobe bronchus.  Areas of lower attenuation with gas within the mass consists with necrotic change and there is chest wall invasion extending into the intercostal musculature of the left axilla and  subpectoral tissues, at this 3 cm lobulated mass in the anterior mediastinum is contiguous and likely nodal disease as well as infiltrative changes of the left 1st through 4th ribs, several old left lower lobe rib fractures and indeterminant nondisplaced fracture of the posterior left eighth rib was noted.  Left axillary adenopathy was appreciated and compression of the left supraclinoid vein by the chest wall mass was appreciated as well.  There was chronic compression fracture of T9 with near complete loss of vertebral body height and anterior wedging and an old compression fracture at T8 12 and L1, there was a 3.2 cm right adrenal nodule concerning for metastatic disease and an indeterminate left adrenal nodule.  A CT-guided biopsy on 05/09/2023 confirmed squamous cell carcinoma consistent with lung primary.  An MRI of the brain on 05/11/2023 showed a solitary 8 mm intracranial metastasis at the anterior/inferior aspect of the left lentiform nucleus.  No significant mass effect or edema was appreciated, chronic microvascular ischemic change was also noted with chronic left PCA distribution infarct.  Her case was discussed in multidisciplinary brain oncology conference and she may be a candidate for stereotactic radiosurgery as well as palliative radiation to the chest.  In the last 24 hours, the patient has become more somnolent, and the hospitalist service was concerned that she was developing more rapid decline with an increased oxygen demand from 2 to 3 L, and somnolence.  Further discussion and disposition was made yesterday that the patient would go to  a skilled facility upon discharge.  While hospice was offered is a reasonable option, the patient's family is interested in therapy.  She is seen today to discuss treatment recommendations of palliative radiation to the chest and the possibility of radiation to the brain pending her clinical course.    PREVIOUS RADIATION THERAPY: No   PAST MEDICAL  HISTORY:  Past Medical History:  Diagnosis Date   Diabetes mellitus without complication (HCC)    Encounter for hepatitis C screening test for low risk patient 08/30/2019   Encounter for imaging to assess osteoporosis 08/30/2019   Encounter for screening mammogram for malignant neoplasm of breast 08/30/2019   Hyperlipidemia    Hypertension    Type 2 diabetes mellitus with neurological complications (HCC) 08/30/2019       PAST SURGICAL HISTORY: Past Surgical History:  Procedure Laterality Date   EYE SURGERY N/A    [employer] 11/10/2020   LEG SURGERY     TUBAL LIGATION       FAMILY HISTORY:  Family History  Problem Relation Age of Onset   Stroke Mother    Heart attack Mother    Heart attack Father      SOCIAL HISTORY:  reports that she has been smoking. She has never used smokeless tobacco. She reports current alcohol use. She reports that she does not use drugs. The patient is widowed and lives in [place]. She is retired and said she did a little bit of everything. She lives with her daughter and grandson with their cats. She enjoys cooking and canning.   ALLERGIES: Penicillins   MEDICATIONS:  Current Facility-Administered Medications  Medication Dose Route Frequency Provider Last Rate Last Admin   acetaminophen (TYLENOL) tablet 1,000 mg  1,000 mg Oral Q6H PRN Rhetta Mura, MD       albuterol (PROVENTIL) (2.5 MG/3ML) 0.083% nebulizer solution 2.5 mg  2.5 mg Nebulization Q2H PRN Zierle-Ghosh, Asia B, DO       cyanocobalamin (VITAMIN B12) tablet 1,000 mcg  1,000 mcg Oral Daily Melven Sartorius, MD   1,000 mcg at 05/14/23 1758   feeding supplement (ENSURE ENLIVE / ENSURE PLUS) liquid 237 mL  237 mL Oral BID BM Smith, Rondell A, MD   237 mL at 05/14/23 1523   folic acid (FOLVITE) tablet 1 mg  1 mg Oral Daily Melven Sartorius, MD   1 mg at 05/14/23 1758   gabapentin (NEURONTIN) capsule 100 mg  100 mg Oral BID Rhetta Mura, MD   100 mg at 05/14/23 2111   heparin injection  5,000 Units  5,000 Units Subcutaneous Q8H Anders Grant C, NP   5,000 Units at 05/15/23 0628   lidocaine (LIDODERM) 5 % 2 patch  2 patch Transdermal Q24H Rhetta Mura, MD   2 patch at 05/14/23 1019   ondansetron (ZOFRAN) tablet 4 mg  4 mg Oral Q6H PRN Zierle-Ghosh, Asia B, DO       Or   ondansetron (ZOFRAN) injection 4 mg  4 mg Intravenous Q6H PRN Zierle-Ghosh, Asia B, DO       polyethylene glycol (MIRALAX / GLYCOLAX) packet 17 g  17 g Oral Daily Rhetta Mura, MD   17 g at 05/14/23 1019   senna-docusate (Senokot-S) tablet 1 tablet  1 tablet Oral QHS Rhetta Mura, MD   1 tablet at 05/14/23 2111   triamcinolone 0.1 % cream : eucerin cream, 1:1   Topical TID PRN Steffanie Rainwater, MD   0.1 Application at 05/11/23 2253   umeclidinium bromide (INCRUSE  ELLIPTA) 62.5 MCG/ACT 1 puff  1 puff Inhalation Daily Rhetta Mura, MD         REVIEW OF SYSTEMS: On review of systems, the patient reports that she is doing okay. She's been very tired, weak, and sleepy. She's coughing and has productive mucous. Her O2 demands are currently 3L [place]. She is not short of breath, but acknowledges pain in her left arm that has been primarily in the upper shoulder area but the pain limits her function and she uses her right arm to move her left. She's had about 50 pounds of unintended weight loss. She is not having headaches, visual or auditory changes, or changes in cognition per her family. No other complaints are verbalized.   PHYSICAL EXAM:  Wt Readings from Last 3 Encounters:  05/14/23 193 lb 9 oz (87.8 kg)  03/09/23 220 lb (99.8 kg)  05/27/20 219 lb (99.3 kg)   Temp Readings from Last 3 Encounters:  05/15/23 98.9 F (37.2 C) (Oral)  03/09/23 97.9 F (36.6 C) (Oral)  05/27/20 97.7 F (36.5 C) (Tympanic)   BP Readings from Last 3 Encounters:  05/15/23 113/68  03/09/23 135/80  05/27/20 132/78   Pulse Readings from Last 3 Encounters:  05/15/23 94  03/09/23 76  05/27/20  92   Pain Assessment Pain Score: 0-No pain/10  In general this is a chronically ill appearing caucasian female in no acute distress. She's alert and oriented x4 and appropriate throughout the examination. Cardiopulmonary assessment is negative for acute distress and she exhibits normal effort.     ECOG = 4  0 - Asymptomatic (Fully active, able to carry on all predisease activities without restriction)  1 - Symptomatic but completely ambulatory (Restricted in physically strenuous activity but ambulatory and able to carry out work of a light or sedentary nature. For example, light housework, office work)  2 - Symptomatic, <50% in bed during the day (Ambulatory and capable of all self care but unable to carry out any work activities. Up and about more than 50% of waking hours)  3 - Symptomatic, >50% in bed, but not bedbound (Capable of only limited self-care, confined to bed or chair 50% or more of waking hours)  4 - Bedbound (Completely disabled. Cannot carry on any self-care. Totally confined to bed or chair)  5 - Death   Santiago Glad MM, Creech RH, Tormey DC, et al. [id number]). "Toxicity and response criteria of the [facility]". Am. Evlyn Clines. Oncol. 5 (6): 649-55    LABORATORY DATA:  Lab Results  Component Value Date   WBC 11.4 (H) 05/14/2023   HGB 10.3 (L) 05/14/2023   HCT 33.7 (L) 05/14/2023   MCV 96.8 05/14/2023   PLT 432 (H) 05/14/2023   Lab Results  Component Value Date   NA 134 (L) 05/14/2023   K 5.2 (H) 05/14/2023   CL 96 (L) 05/14/2023   CO2 26 05/14/2023   Lab Results  Component Value Date   ALT 10 05/08/2023   AST 21 05/08/2023   ALKPHOS 40 05/08/2023   BILITOT 0.8 05/08/2023      RADIOGRAPHY: DG CHEST PORT 1 VIEW  Result Date: 05/13/2023 CLINICAL DATA:  Evaluate pneumonia. EXAM: PORTABLE CHEST 1 VIEW COMPARISON:  05/08/2023 FINDINGS: There has been no significant interval change and abnormal asymmetric opacification of the entire left lung  which likely reflects extensive left upper lobe consolidation with veil like opacification over the left lower lobe. This obscures the left heart border. Signs of venous congestion  within the right lung noted without focal airspace opacity or signs of right pleural effusion. IMPRESSION: 1. No significant interval change in asymmetric opacification of the entire left lung which likely reflects extensive left upper lobe lung mass with veil like opacification over the left lower lobe. 2. Signs of venous congestion within the right lung. Electronically Signed   By: Signa Kell M.D.   On: 05/13/2023 19:25   MR BRAIN W WO CONTRAST  Result Date: 05/12/2023 CLINICAL DATA:  Initial evaluation for non-small cell lung cancer, staging. EXAM: MRI HEAD WITHOUT AND WITH CONTRAST TECHNIQUE: Multiplanar, multiecho pulse sequences of the brain and surrounding structures were obtained without and with intravenous contrast. CONTRAST:  8.59mL GADAVIST GADOBUTROL 1 MMOL/ML IV SOLN COMPARISON:  None available. FINDINGS: Brain: Examination degraded by motion artifact. Cerebral volume within normal limits. Patchy T2/FLAIR hyperintensity involving the periventricular deep white matter both cerebral hemispheres, consistent with chronic small vessel ischemic disease, mild-to-moderate in nature. Patchy involvement of the pons noted. Encephalomalacia and gliosis involving the left occipital lobe, consistent with a chronic left PCA distribution infarct. No evidence for acute or subacute ischemia. No acute or chronic intracranial blood products. Single enhancing intraparenchymal mass centered at the anterior/inferior aspect of the left lentiform nucleus measures 8 mm, consistent with a intracranial metastasis (series 16, image 26). No significant surrounding edema or mass effect. No other visible intracranial metastatic lesions. No other mass lesion, mass effect or midline shift. No hydrocephalus or extra-axial fluid collection. Pituitary  gland suprasellar region within normal limits. No other abnormal enhancement. Vascular: Major intracranial vascular flow voids are maintained. Skull and upper cervical spine: Craniocervical junction within normal limits. Bone marrow signal intensity within normal limits. No visible focal marrow replacing lesion. No scalp soft tissue abnormality. Sinuses/Orbits: Prior bilateral ocular lens replacement. Paranasal sinuses are clear. Moderate right mastoid effusion, of uncertain significance. Other: None. IMPRESSION: 1. Solitary 8 mm intracranial metastasis at the anterior/inferior aspect of the left lentiform nucleus. No significant mass effect. 2. Underlying chronic microvascular ischemic disease with chronic left PCA distribution infarct. Electronically Signed   By: Rise Mu M.D.   On: 05/12/2023 03:47   CT LUNG MASS BIOPSY  Result Date: 05/09/2023 INDICATION: No known primary, now with large left upper lobe mass eroding the left chest wall. Please perform image guided biopsy for tissue diagnostic purposes. EXAM: CT AND ULTRASOUND-GUIDED LEFT UPPER LOBE LUNG MASS COMPARISON:  Chest CT-05/08/2023 MEDICATIONS: None. ANESTHESIA/SEDATION: Moderate (conscious) sedation was employed during this procedure as administered by the Interventional Radiology RN. A total of Versed 1 mg and Fentanyl 75 mcg was administered intravenously. Moderate Sedation Time: 15 minutes. The patient's level of consciousness and vital signs were monitored continuously by radiology nursing throughout the procedure under my direct supervision. CONTRAST:  None. COMPLICATIONS: None immediate. PROCEDURE: Informed consent was obtained from the patient following an explanation of the procedure, risks, benefits and alternatives. A time out was performed prior to the initiation of the procedure. The patient was positioned supine on the CT table and a limited CT was performed for procedural planning demonstrating unchanged size and  appearance of the aggressive left upper lobe mass eroding through the left anterolateral chest wall. CT gantry table position was marked and the dominant macrolobulated apical component measuring at least 6.6 x 5.2 cm (image 18, series 2), was identified sonographically. The procedure was planned. The operative site was prepped and draped in the usual sterile fashion. After the overlying soft tissues were anesthetized with 1% lidocaine with epinephrine,  a 17 gauge coaxial needle was utilized to access the apically located chest wall component. Multiple ultrasound images were saved procedural documentation purposes. Appropriate position was confirmed with CT imaging (representative image 13, series 3). Next, under direct ultrasound guidance, 6 core needle biopsies were obtained of the left anterior chest wall mass. Biopsy samples were placed in formalin and submitted for analysis. The coaxial needle was removed and postprocedural CT imaging was obtained in negative for the presence a complication. Specifically, no pneumothorax or sizable hematoma. A dressing was applied. The patient tolerated the procedure well without immediate postprocedural complication. IMPRESSION: Technically successful ultrasound and CT guided core needle biopsy of apical anterior component of left upper lobe/chest wall mass. Electronically Signed   By: Simonne Come M.D.   On: 05/09/2023 14:36   CT CHEST ABDOMEN PELVIS W CONTRAST  Result Date: 05/08/2023 CLINICAL DATA:  Left arm pain and weakness. Left upper lobe mass. Evaluate for metastatic disease. EXAM: CT CHEST, ABDOMEN, AND PELVIS WITH CONTRAST TECHNIQUE: Multidetector CT imaging of the chest, abdomen and pelvis was performed following the standard protocol during bolus administration of intravenous contrast. RADIATION DOSE REDUCTION: This exam was performed according to the departmental dose-optimization program which includes automated exposure control, adjustment of the mA and/or kV  according to patient size and/or use of iterative reconstruction technique. CONTRAST:  75mL OMNIPAQUE IOHEXOL 350 MG/ML SOLN COMPARISON:  Chest radiograph dated 05/08/2023. FINDINGS: CT CHEST FINDINGS Cardiovascular: There is no cardiomegaly or pericardial effusion. Moderate calcified and noncalcified plaque of the thoracic aorta. No aneurysmal dilatation or dissection. The origins of the great vessels of the aortic arch appear patent. There is mass effect and compression of the left main pulmonary artery with high-grade narrowing of the vessel. The central pulmonary arteries remain patent. Mediastinum/Nodes: The esophagus is grossly unremarkable. No mediastinal fluid collection. Lungs/Pleura: Large mass occupying the entire left upper lobe extending into the left hilum and mediastinum. The mass measures approximately 13 x 17 cm in greatest axial dimensions and 15 cm in craniocaudal length. The mass abuts the descending thoracic aorta and aortic arch. There is encasement of the left main pulmonary artery with mass effect and high-grade narrowing. The mass also abuts the distal trachea and encases the left mainstem bronchus. There is occlusion of the left upper lobe bronchus. Areas of lower attenuation with gas within the mass consistent with necrotic changes. There is invasion of the left chest wall with extension of the mass outside of the intercostal musculature into the left axilla and subpectoral tissues. A 2 x 3 cm lobulated mass in the anterior mediastinum likely contiguous with the left upper lobe mass or represent metastases. Musculoskeletal: There is infiltrative changes of the left 1st-4th ribs. Several old left lower rib fractures. Age indeterminate nondisplaced fracture of the posterior left eighth rib. There is left axillary adenopathy. There is compression of the left supraclinoid vein by the chest wall mass. Age indeterminate, likely chronic compression fracture of T9 with near complete loss of  vertebral body height and anterior wedging. Additional old appearing compression fractures of T12 and L1. Correlation with clinical exam and point tenderness recommended. CT ABDOMEN PELVIS FINDINGS No intra-abdominal free air or free fluid. Hepatobiliary: The liver is unremarkable. No biliary dilatation. The gallbladder is unremarkable. Pancreas: Unremarkable. No pancreatic ductal dilatation or surrounding inflammatory changes. Spleen: Normal in size without focal abnormality. Adrenals/Urinary Tract: A 3.2 x 2.2 cm right adrenal nodule most consistent with metastatic disease. Indeterminate 1 cm left adrenal nodule. There is no  hydronephrosis on either side. There is symmetric enhancement and excretion of contrast by both kidneys. The visualized ureters and urinary bladder appear unremarkable. Stomach/Bowel: There is sigmoid diverticulosis with muscular hypertrophy. No active inflammatory changes. There is loose stool in the proximal colon. No bowel obstruction or active inflammation. The appendix is normal. Vascular/Lymphatic: Advanced calcified and noncalcified plaque of the abdominal aorta. The aorta is ectatic measuring 2.5 cm in diameter. The IVC is unremarkable. No portal venous gas. No adenopathy. Reproductive: The uterus is grossly unremarkable. No adnexal masses. Other: None Musculoskeletal: Osteopenia. Chronic appearing L1 compression fracture. IMPRESSION: 1. Large left upper lobe mass as above. Multidisciplinary consult is advised. 2. Right adrenal nodule most consistent with metastatic disease. 3. Age indeterminate, likely chronic compression fracture of T9. Correlation with clinical exam and point tenderness recommended. 4. Sigmoid diverticulosis. No bowel obstruction. Normal appendix. 5.  Aortic Atherosclerosis (ICD10-I70.0). Electronically Signed   By: Elgie Collard M.D.   On: 05/08/2023 03:07   DG Shoulder Left  Result Date: 05/08/2023 CLINICAL DATA:  Worsening shoulder pain EXAM: LEFT SHOULDER  - 2+ VIEW COMPARISON:  Radiographs 03/09/2023 FINDINGS: No acute fracture or dislocation of the left shoulder. Age indeterminate left rib fractures. Airspace opacities in the left lung, see separate details. IMPRESSION: 1. No acute fracture or dislocation of the left shoulder. 2. Age indeterminate left rib fractures. 3. Airspace opacities in the left lung, see separate details. Electronically Signed   By: Minerva Fester M.D.   On: 05/08/2023 00:49   DG Chest 2 View  Result Date: 05/08/2023 CLINICAL DATA:  Weakness EXAM: CHEST - 2 VIEW COMPARISON:  None Available. FINDINGS: Dense consolidation within the a left upper lobe with mass effect resulting in deviation of the trachea to the right. Right lung is clear. No pneumothorax or pleural effusion. Cardiac size is within normal limits. IMPRESSION: 1. Dense consolidation within the left upper lobe with mass effect resulting in deviation of the trachea to the right. While this may reflect pneumonia, underlying neoplasm is not excluded. CT examination is recommended for further evaluation. Electronically Signed   By: Helyn Numbers M.D.   On: 05/08/2023 00:46       IMPRESSION/PLAN: 1. Stage IV, cT4N2M1c, NSCLC, squamous cell carcinoma of the left lung with adrenal and brain metastases. Dr. Mitzi Hansen has reviewed her case and our team has discussed her case in brain oncology conference. We are aware of her pathology findings and today we discussed the role of palliative interventions. Radiographically she would be a candidate for palliative radiation to the chest over 10 fractions of therapy, and we also discussed the possibility of single fraction stereotactic radiosurgery [facility]) if additional imaging of her brain confirmed low tumor burden. She has been very somnolent and weak from impressions by her other providers. The patient and family have also been offered hospice care, which at this time they are not interested in. The patient and her son and daughter are in  agreement to palliative radiation to her chest after we discussed the delivery, logistics, and need for simulation to plan her therapy. We discussed the risks, benefits, short, and long term effects of radiotherapy, as well as the palliative  intent, and the patient is interested in proceeding. Written consent is obtained and placed in the chart, a copy was provided to the patient. She will be brought by [facility] to our department this afternoon, and we anticipate starting therapy tomorrow.  2. Brain metastasis. The patient may only have a solitary lesion, but we  discussed the need for 3T MRI to confirm this and that this is used in treatment planning. She would also need to see neurosurgery for consultation and we discussed their role in [facility] treatment. Written consent is obtained and placed in the chart, a copy was provided to the patient. We discussed the risks, benefits, short, and long term effects of SRS radiotherapy, as well as the intent, and the patient is interested in proceeding if appropriate. Given that she is asymptomatic, we can coordinate her 3T MRI closer to the time of discharge. She is aware that she would need simulation as well for this and a mask would be made in order to have immobilization at the time of [facility] treatment. We will coordinate this closer to the time of discharge as well.     In a visit lasting 75 minutes, greater than 50% of the time was spent face to face discussing the patient's condition, in preparation for the discussion, and coordinating the patient's care.       Osker Mason, [facility]   **Disclaimer: This note was dictated with voice recognition software. Similar sounding words can inadvertently be transcribed and this note may contain transcription errors which may not have been corrected upon publication of note.**

## 2023-05-15 NOTE — Plan of Care (Signed)
  Problem: Pain Managment: Goal: General experience of comfort will improve Outcome: Progressing   Problem: Safety: Goal: Ability to remain free from injury will improve Outcome: Progressing   

## 2023-05-15 NOTE — Progress Notes (Signed)
I spoke with the patient's daughter Babette Relic that based on needing time to plan her treatment, Dr. Mitzi Hansen prefers she start therapy on Thursday this week. We will defer to Dr. Mahala Menghini regarding disposition for remainder of hospital stay and SNF placement.      Osker Mason, PAC

## 2023-05-15 NOTE — Progress Notes (Signed)
Madison Ho   DOB:1951/05/20   YQ#:[id number]    ASSESSMENT & PLAN:  72 year old pleasant female with medical history of  type II DM, diabetic neuropathy, venous stasis, chronic left shoulder pain is admitted for fatigue found to have metastatic squamous cell carcinoma from left lung mass biopsy.     05/11/23 oncology consult. Discussed with patient and family there is large left lung mass invading various structures, right adrenal metastases.  Her findings are more consistent with stage IV or metastatic lung cancer.  We discussed metastatic lung cancer is generally not curable, however treatable.  This will depend on patient's performance status as well.  Family reported previously in good function 3 months ago and she has excellent memory.  We discussed the next step will be obtaining MRI of the brain to complete staging.  We will request foundation 1 testing for molecular testing.  Discussed that in general, first step is to look for actionable mutation.  If there is actionable mutation, patient will consider for targeted therapy.  If not, PD-L1 expression can be will be next step evaluate for immunotherapy.  Chemoimmunotherapy can be considered in patient with good functional status without other good options.    Patient and family expressed they all would like to pursue cancer directed treatment.    10/12 MRI brain: Solitary 8 mm intracranial metastasis  Discussed results. She is asymptomatic. There is no cerebral edema. Will consult radiation oncology for evaluation of SRS and palliative XRT.  Updated family including son and daughter.   10/14 reported aspiration like symptoms, somnolent and patient feels weak and performance status seem to be limited.  Discussed goals of care, son was on the phone with daughter at bedside.  Long discussion on goals of care.  Given her physical status is not improving, I am concerning about likelihood of complication from her malignancy, as well as undergoing  more treatment.  Discussed DNR, hospice in the setting if she were to decline physically and not strong enough for cancer directed treatment.  Discussed importance of quality of life and keep her comfortable and not prolong her suffering.  Discussed patient can potentially develop significant complication, expected in and out of the hospital, admission, emergency room throughout the course of treatment.  Daughter said they will discuss among each other and son is not ready for this.  10/15 patient more alert today.  Responding to questions appropriately.  Pain over the left shoulder blade back area secondary to malignancy.  Discussed today with patient, daughter in the room, and son over the phone, our radiation oncology colleagues will be seeing and determine if she is a candidate for palliative radiation.  I have requested Foundation One testing for her.  This may take 2 weeks.  If she continues to improve, and able to complete palliative radiation, we will have her follow-up with Dr. Shirline Frees, lung cancer specialist to discuss NGS results, and treatment plan.  If she were to decline clinically, then goals of care should be readdressed and consider palliative care with hospice.   Brain metastasis Solitary. She is asymptomatic. There is no cerebral edema. Hold on start steroid for now. Pending evaluation for SRS   [phone] SCC of lung MRI of the brain showed solitary met at the anterior/inferior aspect of the left lentiform nucleus Single right adrenal metastasis Requesting Foundation 1 testing Consult for palliative radiation Will arrange follow-up with outpatient lung cancer oncologist Dr. Shirline Frees. Palliative care consult   Adrenal metastases 3.2 x 2.2 cm right adrenal  metastasis  Acute on chronic respiratory failure On 3L. consult for palliative radiation   Normocytic anemia with low B12 and folic deficiency low B12, and folate Start b12 and folic acid   Discharge planning Pending  evaluation by radiation oncology, and her clinical course, she may or may not be eligible for systemic treatment.  If she proceed with radiation, we will follow-up as outpatient after she completed radiation to discuss NGS with lung cancer specialist Dr. Shirline Frees in about 2-3 weeks.  Oncology will sign off for now and request outpatient follow-up.  Please call if any questions.  All questions were answered.    Thank you for the consult.   Melven Sartorius, MD 05/15/2023 10:57 AM  Subjective:  Madison Ho reports feeling   Objective:  Vitals:   05/15/23 0400 05/15/23 0830  BP: 117/66 113/68  Pulse: 95 94  Resp: 20 17  Temp: 98 F (36.7 C) 98.9 F (37.2 C)  SpO2: 92% 92%     Intake/Output Summary (Last 24 hours) at 05/15/2023 1057 Last data filed at 05/15/2023 0400 Gross per 24 hour  Intake 1416.06 ml  Output 600 ml  Net 816.06 ml    GENERAL: alert, no distress and comfortable SKIN: skin color normal EYES: normal, sclera clear OROPHARYNX: moist, no exudate, no erythema.  NECK: supple, no palpable mass LYMPH:  no palpable cervical, axillary lymphadenopathy LUNGS: No wheeze, rales and clear to auscultation bilaterally with normal breathing effort.  HEART: regular rate & rhythm  ABDOMEN: abdomen soft, non-tender and non-distended Musculoskeletal: no lower extremity edema NEURO: alert with fluent speech, no focal motor/sensory deficits   Labs:  Recent Labs    05/07/23 2336 05/08/23 0143 05/08/23 0859 05/10/23 0750 05/12/23 0321 05/14/23 0348  NA 136   < > 132* 136 133* 134*  K 4.1   < > 4.0 4.0 4.3 5.2*  CL 103   < > 99 100 99 96*  CO2 17*  --  19* 23 23 26   GLUCOSE 112*   < > 76 137* 128* 114*  BUN 26*   < > 21 13 10 16   CREATININE 1.43*   < > 1.02* 0.93 0.92 1.06*  CALCIUM 9.4  --  8.5* 8.7* 8.7* 9.0  GFRNONAA 39*  --  58* >60 >60 55*  PROT 7.8  --  5.9*  --   --   --   ALBUMIN 2.8*  --  2.1*  --   --   --   AST 24  --  21  --   --   --   ALT 12  --  10  --    --   --   ALKPHOS 55  --  40  --   --   --   BILITOT 0.7  --  0.8  --   --   --    < > = values in this interval not displayed.    Studies:  DG CHEST PORT 1 VIEW  Result Date: 05/13/2023 CLINICAL DATA:  Evaluate pneumonia. EXAM: PORTABLE CHEST 1 VIEW COMPARISON:  05/08/2023 FINDINGS: There has been no significant interval change and abnormal asymmetric opacification of the entire left lung which likely reflects extensive left upper lobe consolidation with veil like opacification over the left lower lobe. This obscures the left heart border. Signs of venous congestion within the right lung noted without focal airspace opacity or signs of right pleural effusion. IMPRESSION: 1. No significant interval change in asymmetric opacification of the entire left  lung which likely reflects extensive left upper lobe lung mass with veil like opacification over the left lower lobe. 2. Signs of venous congestion within the right lung. Electronically Signed   By: Signa Kell M.D.   On: 05/13/2023 19:25   MR BRAIN W WO CONTRAST  Result Date: 05/12/2023 CLINICAL DATA:  Initial evaluation for non-small cell lung cancer, staging. EXAM: MRI HEAD WITHOUT AND WITH CONTRAST TECHNIQUE: Multiplanar, multiecho pulse sequences of the brain and surrounding structures were obtained without and with intravenous contrast. CONTRAST:  8.54mL GADAVIST GADOBUTROL 1 MMOL/ML IV SOLN COMPARISON:  None available. FINDINGS: Brain: Examination degraded by motion artifact. Cerebral volume within normal limits. Patchy T2/FLAIR hyperintensity involving the periventricular deep white matter both cerebral hemispheres, consistent with chronic small vessel ischemic disease, mild-to-moderate in nature. Patchy involvement of the pons noted. Encephalomalacia and gliosis involving the left occipital lobe, consistent with a chronic left PCA distribution infarct. No evidence for acute or subacute ischemia. No acute or chronic intracranial blood products.  Single enhancing intraparenchymal mass centered at the anterior/inferior aspect of the left lentiform nucleus measures 8 mm, consistent with a intracranial metastasis (series 16, image 26). No significant surrounding edema or mass effect. No other visible intracranial metastatic lesions. No other mass lesion, mass effect or midline shift. No hydrocephalus or extra-axial fluid collection. Pituitary gland suprasellar region within normal limits. No other abnormal enhancement. Vascular: Major intracranial vascular flow voids are maintained. Skull and upper cervical spine: Craniocervical junction within normal limits. Bone marrow signal intensity within normal limits. No visible focal marrow replacing lesion. No scalp soft tissue abnormality. Sinuses/Orbits: Prior bilateral ocular lens replacement. Paranasal sinuses are clear. Moderate right mastoid effusion, of uncertain significance. Other: None. IMPRESSION: 1. Solitary 8 mm intracranial metastasis at the anterior/inferior aspect of the left lentiform nucleus. No significant mass effect. 2. Underlying chronic microvascular ischemic disease with chronic left PCA distribution infarct. Electronically Signed   By: Rise Mu M.D.   On: 05/12/2023 03:47   CT LUNG MASS BIOPSY  Result Date: 05/09/2023 INDICATION: No known primary, now with large left upper lobe mass eroding the left chest wall. Please perform image guided biopsy for tissue diagnostic purposes. EXAM: CT AND ULTRASOUND-GUIDED LEFT UPPER LOBE LUNG MASS COMPARISON:  Chest CT-05/08/2023 MEDICATIONS: None. ANESTHESIA/SEDATION: Moderate (conscious) sedation was employed during this procedure as administered by the Interventional Radiology RN. A total of Versed 1 mg and Fentanyl 75 mcg was administered intravenously. Moderate Sedation Time: 15 minutes. The patient's level of consciousness and vital signs were monitored continuously by radiology nursing throughout the procedure under my direct  supervision. CONTRAST:  None. COMPLICATIONS: None immediate. PROCEDURE: Informed consent was obtained from the patient following an explanation of the procedure, risks, benefits and alternatives. A time out was performed prior to the initiation of the procedure. The patient was positioned supine on the CT table and a limited CT was performed for procedural planning demonstrating unchanged size and appearance of the aggressive left upper lobe mass eroding through the left anterolateral chest wall. CT gantry table position was marked and the dominant macrolobulated apical component measuring at least 6.6 x 5.2 cm (image 18, series 2), was identified sonographically. The procedure was planned. The operative site was prepped and draped in the usual sterile fashion. After the overlying soft tissues were anesthetized with 1% lidocaine with epinephrine, a 17 gauge coaxial needle was utilized to access the apically located chest wall component. Multiple ultrasound images were saved procedural documentation purposes. Appropriate position was confirmed with  CT imaging (representative image 13, series 3). Next, under direct ultrasound guidance, 6 core needle biopsies were obtained of the left anterior chest wall mass. Biopsy samples were placed in formalin and submitted for analysis. The coaxial needle was removed and postprocedural CT imaging was obtained in negative for the presence a complication. Specifically, no pneumothorax or sizable hematoma. A dressing was applied. The patient tolerated the procedure well without immediate postprocedural complication. IMPRESSION: Technically successful ultrasound and CT guided core needle biopsy of apical anterior component of left upper lobe/chest wall mass. Electronically Signed   By: Simonne Come M.D.   On: 05/09/2023 14:36   CT CHEST ABDOMEN PELVIS W CONTRAST  Result Date: 05/08/2023 CLINICAL DATA:  Left arm pain and weakness. Left upper lobe mass. Evaluate for metastatic  disease. EXAM: CT CHEST, ABDOMEN, AND PELVIS WITH CONTRAST TECHNIQUE: Multidetector CT imaging of the chest, abdomen and pelvis was performed following the standard protocol during bolus administration of intravenous contrast. RADIATION DOSE REDUCTION: This exam was performed according to the departmental dose-optimization program which includes automated exposure control, adjustment of the mA and/or kV according to patient size and/or use of iterative reconstruction technique. CONTRAST:  75mL OMNIPAQUE IOHEXOL 350 MG/ML SOLN COMPARISON:  Chest radiograph dated 05/08/2023. FINDINGS: CT CHEST FINDINGS Cardiovascular: There is no cardiomegaly or pericardial effusion. Moderate calcified and noncalcified plaque of the thoracic aorta. No aneurysmal dilatation or dissection. The origins of the great vessels of the aortic arch appear patent. There is mass effect and compression of the left main pulmonary artery with high-grade narrowing of the vessel. The central pulmonary arteries remain patent. Mediastinum/Nodes: The esophagus is grossly unremarkable. No mediastinal fluid collection. Lungs/Pleura: Large mass occupying the entire left upper lobe extending into the left hilum and mediastinum. The mass measures approximately 13 x 17 cm in greatest axial dimensions and 15 cm in craniocaudal length. The mass abuts the descending thoracic aorta and aortic arch. There is encasement of the left main pulmonary artery with mass effect and high-grade narrowing. The mass also abuts the distal trachea and encases the left mainstem bronchus. There is occlusion of the left upper lobe bronchus. Areas of lower attenuation with gas within the mass consistent with necrotic changes. There is invasion of the left chest wall with extension of the mass outside of the intercostal musculature into the left axilla and subpectoral tissues. A 2 x 3 cm lobulated mass in the anterior mediastinum likely contiguous with the left upper lobe mass or  represent metastases. Musculoskeletal: There is infiltrative changes of the left 1st-4th ribs. Several old left lower rib fractures. Age indeterminate nondisplaced fracture of the posterior left eighth rib. There is left axillary adenopathy. There is compression of the left supraclinoid vein by the chest wall mass. Age indeterminate, likely chronic compression fracture of T9 with near complete loss of vertebral body height and anterior wedging. Additional old appearing compression fractures of T12 and L1. Correlation with clinical exam and point tenderness recommended. CT ABDOMEN PELVIS FINDINGS No intra-abdominal free air or free fluid. Hepatobiliary: The liver is unremarkable. No biliary dilatation. The gallbladder is unremarkable. Pancreas: Unremarkable. No pancreatic ductal dilatation or surrounding inflammatory changes. Spleen: Normal in size without focal abnormality. Adrenals/Urinary Tract: A 3.2 x 2.2 cm right adrenal nodule most consistent with metastatic disease. Indeterminate 1 cm left adrenal nodule. There is no hydronephrosis on either side. There is symmetric enhancement and excretion of contrast by both kidneys. The visualized ureters and urinary bladder appear unremarkable. Stomach/Bowel: There is sigmoid diverticulosis  with muscular hypertrophy. No active inflammatory changes. There is loose stool in the proximal colon. No bowel obstruction or active inflammation. The appendix is normal. Vascular/Lymphatic: Advanced calcified and noncalcified plaque of the abdominal aorta. The aorta is ectatic measuring 2.5 cm in diameter. The IVC is unremarkable. No portal venous gas. No adenopathy. Reproductive: The uterus is grossly unremarkable. No adnexal masses. Other: None Musculoskeletal: Osteopenia. Chronic appearing L1 compression fracture. IMPRESSION: 1. Large left upper lobe mass as above. Multidisciplinary consult is advised. 2. Right adrenal nodule most consistent with metastatic disease. 3. Age  indeterminate, likely chronic compression fracture of T9. Correlation with clinical exam and point tenderness recommended. 4. Sigmoid diverticulosis. No bowel obstruction. Normal appendix. 5.  Aortic Atherosclerosis (ICD10-I70.0). Electronically Signed   By: Elgie Collard M.D.   On: 05/08/2023 03:07   DG Shoulder Left  Result Date: 05/08/2023 CLINICAL DATA:  Worsening shoulder pain EXAM: LEFT SHOULDER - 2+ VIEW COMPARISON:  Radiographs 03/09/2023 FINDINGS: No acute fracture or dislocation of the left shoulder. Age indeterminate left rib fractures. Airspace opacities in the left lung, see separate details. IMPRESSION: 1. No acute fracture or dislocation of the left shoulder. 2. Age indeterminate left rib fractures. 3. Airspace opacities in the left lung, see separate details. Electronically Signed   By: Minerva Fester M.D.   On: 05/08/2023 00:49   DG Chest 2 View  Result Date: 05/08/2023 CLINICAL DATA:  Weakness EXAM: CHEST - 2 VIEW COMPARISON:  None Available. FINDINGS: Dense consolidation within the a left upper lobe with mass effect resulting in deviation of the trachea to the right. Right lung is clear. No pneumothorax or pleural effusion. Cardiac size is within normal limits. IMPRESSION: 1. Dense consolidation within the left upper lobe with mass effect resulting in deviation of the trachea to the right. While this may reflect pneumonia, underlying neoplasm is not excluded. CT examination is recommended for further evaluation. Electronically Signed   By: Helyn Numbers M.D.   On: 05/08/2023 00:46

## 2023-05-15 NOTE — Progress Notes (Signed)
HOSPITALIST ROUNDING NOTE Madison Ho:[id number]  DOB: July 30, 1951  DOA: 05/07/2023  PCP: Ignatius Specking, MD  05/15/2023,4:56 PM   LOS: 7 days      Code Status: Presumed full From: Home  current Dispo: Unclear    72 year old home dwelling female HTN HLD DM TY 2 BMI 30 Prior smoker until about 2 months ago Came to emergency room 10/8 with left shoulder pain 40 pound weight loss failure to thrive low-grade fever temp tachycardia and hypotension responsive to IV fluid Urine culture not obtained at admission blood culture X210/8 negative to date CT scan = large left lung mass concerning for primary lung malignancy- 10/9 biopsy left upper lobe showing metastatic squamous cell CA  10/11 oncology consult 10-CT 4N2M1 SCC lung Ordered MRI brain foundation 1 testing and recommended palliative care as performance status borderline  10/13 p.m., coughing with liquids SLP engaged-kept n.p.o.-started antibiotics x-ray relatively unchanged  Plan  Presumed sepsis on admission 2/2 UTI? urine cultures not collected-[place] below discussion  ?  Aspiration superimposed on CT 4N2M1 SCC metastatic lung cancer with mets to right adrenal, brain Not convinced she has pneumonia although she is aspirating some--Rocephin given and discontinued 10/14 Family wishes aggressive care awaiting speech therapy I have placed on restrictive dysphagia 1 diet as has been n.p.o. for several days-if she coughs should discontinue  Metastatic stage IV lung cancer mets to right adrenal brain Poor performance status at baseline-long discussions with Dr. Cherly Hensen oncology previously Potentially XRT later this week-does need time to plan insurance Poor performance status for aggressive chemo--- does not require AED or steroids for brain mets--- appreciative of palliative input  COPD smoker till recently acute respiratory failure superimposed on her COPD periodic inhalers, add Spiriva-no acute flare does not require steroids - Was  slightly encephalopathic Chronic left shoulder pain,   chronic compression fracture T9 Gabapentin oxycodone completely stopped-much more awake Only use lidocaine patches and high-dose Tylenol 1000 every 6 as needed pain  AKI hyperkalemia  Was given saline previously-hyperkalemia resolved but now is back up slightly-May require fluids again in a.m.-restrictive fluid abdomen pulses so just watch labs in a.m.   DM TY 2  obesity BMI 32  metformin held at this time-may resume at discharge if creatinine supports  Discussed with daughter at the bedside-reiterated from patient that she wants "everything done" at this time  DVT prophylaxis: Heparin  Status is: Inpatient Remains inpatient appropriate because:     Eventual skilled care in the next 1 to 2 days   Subjective:  Just back from radiation therapy-much more awake coherent No chest pain Minimal cough but is not really eating only on liquids Graduated her diet She looks relatively comfortable  Objective + exam Vitals:   05/15/23 0000 05/15/23 0145 05/15/23 0400 05/15/23 0830  BP: 102/64 (!) 108/59 117/66 113/68  Pulse: 99 95 95 94  Resp: 20  20 17   Temp: 98.1 F (36.7 C) 98.3 F (36.8 C) 98 F (36.7 C) 98.9 F (37.2 C)  TempSrc:  Oral  Oral  SpO2: 93% 93% 92% 92%  Weight:      Height:       Filed Weights   05/12/23 0500 05/13/23 0500 05/14/23 0500  Weight: 83.4 kg 87.7 kg 87.8 kg    Examination:  Coherent x 3 Anterior chest is clear I do not appreciate any coughing in the room on S1-S2 no murmur next trace lower extremity edema   Data Reviewed: reviewed   CBC    Component  Value Date/Time   WBC 11.4 (H) 05/14/2023 0348   RBC 3.48 (L) 05/14/2023 0348   HGB 10.3 (L) 05/14/2023 0348   HCT 33.7 (L) 05/14/2023 0348   PLT 432 (H) 05/14/2023 0348   MCV 96.8 05/14/2023 0348   MCH 29.6 05/14/2023 0348   MCHC 30.6 05/14/2023 0348   RDW 19.2 (H) 05/14/2023 0348   LYMPHSABS 1.7 05/14/2023 0348   MONOABS 1.0  05/14/2023 0348   EOSABS 0.3 05/14/2023 0348   BASOSABS 0.1 05/14/2023 0348      Latest Ref Rng & Units 05/14/2023    3:48 AM 05/12/2023    3:21 AM 05/10/2023    7:50 AM  CMP  Glucose 70 - 99 mg/dL 161  096  045   BUN 8 - 23 mg/dL 16  10  13    Creatinine 0.44 - 1.00 mg/dL 4.09  8.11  9.14   Sodium 135 - 145 mmol/L 134  133  136   Potassium 3.5 - 5.1 mmol/L 5.2  4.3  4.0   Chloride 98 - 111 mmol/L 96  99  100   CO2 22 - 32 mmol/L 26  23  23    Calcium 8.9 - 10.3 mg/dL 9.0  8.7  8.7      Scheduled Meds:  acetaminophen  1,000 mg Oral Q8H   vitamin B-12  1,000 mcg Oral Daily   feeding supplement  237 mL Oral BID BM   folic acid  1 mg Oral Daily   gabapentin  100 mg Oral BID   heparin  5,000 Units Subcutaneous Q8H   lidocaine  2 patch Transdermal Q24H   polyethylene glycol  17 g Oral Daily   senna-docusate  1 tablet Oral QHS   umeclidinium bromide  1 puff Inhalation Daily   Continuous Infusions:   Time  25  Rhetta Mura, MD  [facility]

## 2023-05-15 NOTE — Progress Notes (Signed)
Called [facility] for pt transport to [facility] at [facility] for appt at 1400.

## 2023-05-15 NOTE — Progress Notes (Signed)
Occupational Therapy Treatment Patient Details Name: Madison Ho MRN: [id number] DOB: Dec 28, 1950 Today's Date: 05/15/2023   History of present illness Patient is 72 y.o. female presented to the [facility] 05/07/23 w/ arm and neck pain for ~2 months with recent weight loss and FTT. Pt found to have sepsis due to UTI, metastatic squamous cell carcinoma from left upper lobe mass with additional R adrenal metastases and 8mm mass at  the anterior/inferior aspect of the left lentiform nucleus. PMH significant for DM II, diabetic neuropathy,  HTN, HLD.   OT comments  Pt feeling out of it, able to participate with frequent rest breaks and increased need for verbal cueing for motivation and task initiation. Pt mod-max A to assist to EOB, poor sitting tolerance at EOB. Pt assist with max A to [facility], RN assisted with toileting hygiene and clean up. Pt requires increased time due to frequent rest breaks and increased time to catch breath. Pt assist to recliner, max A, decreased ability to take steps and pivot, not able to control sitting into recliner, assisted down with use of gait belt. Pt DC plan change to postacute rehab <3hrs/day appropriate, will continue to see acutely to progress as able.       If plan is discharge home, recommend the following:  A lot of help with walking and/or transfers;A lot of help with bathing/dressing/bathroom;Assistance with cooking/housework;Direct supervision/assist for medications management;Direct supervision/assist for financial management;Assist for transportation;Help with stairs or ramp for entrance   Equipment Recommendations  Other (comment) (defer)    Recommendations for Other Services      Precautions / Restrictions Precautions Precautions: Fall Precaution Comments: 3L O2 Restrictions Weight Bearing Restrictions: No       Mobility Bed Mobility Overal bed mobility: Needs Assistance Bed Mobility: Supine to Sit     Supine to sit: Mod assist, HOB elevated,  Used rails     General bed mobility comments: strong mod A for supine to sit on EOB    Transfers Overall transfer level: Needs assistance Equipment used: Rolling walker (2 wheels) Transfers: Sit to/from Stand, Bed to chair/wheelchair/BSC Sit to Stand: Mod assist, From elevated surface   Squat pivot transfers: Max assist, From elevated surface       General transfer comment: mod A to power STS, max A for transfer due to decreased ability to pivot, and collapses onto chair/bed when lowering down, not able to control descent.     Balance Overall balance assessment: Needs assistance Sitting-balance support: Bilateral upper extremity supported Sitting balance-Leahy Scale: Poor Sitting balance - Comments: poor endurance, frequent breaks with leaning back onto bed or back of chair   Standing balance support: Bilateral upper extremity supported, During functional activity, Reliant on assistive device for balance Standing balance-Leahy Scale: Poor Standing balance comment: reliant on RW and assist from therapist using gait belt, decreased ability to control descent to sitting                           ADL either performed or assessed with clinical judgement   ADL Overall ADL's : Needs assistance/impaired Eating/Feeding: Set up;Sitting                       Toilet Transfer: Moderate assistance;BSC/3in1;Stand-pivot   Toileting- Clothing Manipulation and Hygiene: Total assistance;Sit to/from stand;Sitting/lateral lean       Functional mobility during ADLs: Moderate assistance;Cueing for sequencing;Cueing for safety;Rolling walker (2 wheels) General ADL Comments: Pt total A for  toileting hygiene, reliant on RW for support for standing while performing perineal hygiene, frequent rest breaks, mod-max A for transfer, max effort.    Extremity/Trunk Assessment Upper Extremity Assessment Upper Extremity Assessment: Generalized weakness            Vision        Perception     Praxis      Cognition Arousal: Alert Behavior During Therapy: Flat affect Overall Cognitive Status: Within Functional Limits for tasks assessed                                 General Comments: Requires repeated verbal cueing for initiation and participation.        Exercises      Shoulder Instructions       General Comments      Pertinent Vitals/ Pain       Pain Assessment Pain Assessment: Faces Faces Pain Scale: Hurts even more Pain Location: L upper rib area Pain Descriptors / Indicators: Sore Pain Intervention(s): Monitored during session  Home Living                                          Prior Functioning/Environment              Frequency  Min 1X/week        Progress Toward Goals  OT Goals(current goals can now be found in the care plan section)  Progress towards OT goals: Progressing toward goals  Acute Rehab OT Goals Patient Stated Goal: to improve functional activity tolerance OT Goal Formulation: With patient/family Time For Goal Achievement: 05/23/23 Potential to Achieve Goals: Fair ADL Goals Pt Will Perform Lower Body Bathing: with min assist;sitting/lateral leans;sit to/from stand Pt Will Perform Lower Body Dressing: with min assist;sitting/lateral leans;sit to/from stand Pt Will Transfer to Toilet: with min assist;ambulating;grab bars Pt Will Perform Toileting - Clothing Manipulation and hygiene: with min assist;sitting/lateral leans;sit to/from stand Additional ADL Goal #1: Patient will be able to complete functional task in standing for 2 minutes prior to needing seated rest break to increase overall activity tolerance.  Plan      Co-evaluation                 AM-PAC OT "6 Clicks" Daily Activity     Outcome Measure   Help from another person eating meals?: A Little Help from another person taking care of personal grooming?: A Little Help from another person toileting,  which includes using toliet, bedpan, or urinal?: A Lot Help from another person bathing (including washing, rinsing, drying)?: A Lot Help from another person to put on and taking off regular upper body clothing?: A Little Help from another person to put on and taking off regular lower body clothing?: A Lot 6 Click Score: 15    End of Session Equipment Utilized During Treatment: Gait belt;Rolling walker (2 wheels)  OT Visit Diagnosis: Unsteadiness on feet (R26.81);Other abnormalities of gait and mobility (R26.89);Muscle weakness (generalized) (M62.81);Adult, failure to thrive (R62.7)   Activity Tolerance Patient limited by fatigue   Patient Left in chair;with call bell/phone within reach;with nursing/sitter in room;with family/visitor present   Nurse Communication Mobility status        Time: 6962-9528 OT Time Calculation (min): 40 min  Charges: OT General Charges $OT Visit: 1 Visit OT Treatments $Self Care/Home  Management : 38-52 mins  [facility], OTR/L   Alexis Goodell 05/15/2023, 11:28 AM

## 2023-05-15 NOTE — Progress Notes (Signed)
Palliative:  HPI:  72 y.o. female  with past medical history of hypertension, hyperlipidemia, diabetes, neuropathy, obesity admitted on 05/07/2023 with significant weight loss, weakness, left shoulder pain and left chest swelling. Diagnosed with potential UTI and aspiration pneumonia but also with metastatic lung care to right adrenal, brain. Tentative plans for SRS radiation to brain but with ongoing decline will reassess goals of care before proceeding with treatment.   I met today with Madison Ho and daughter, Madison Ho, at bedside. Madison Ho is finishing up with mobility specialist and back in bed. She is tired after activity. Overall doing well today. Had some restlessness and discomfort overnight. Plans for radiation appointment today at 1400. They are hoping that radiation to lung mass will ultimately shrink tumor and provide some pain relief. Goals to pursue treatment. No further decisions at this time. I provided daughter with information regarding anatomical gift per request. Scheduled Tylenol 1000 mg q8h - requested RN provide dose prior to radiation appointment to assist with discomfort in transfer and during simulation. I let them know that I am around for any questions or concerns. Emotional support provided.   Exam: Alert but fluctuating mentation. Fatigued. No distress. Breathing regular, unlabored. Abd soft. Moves all extremities.   Plan: - Ongoing palliative support.  - Time for outcomes and to process diagnosis.   25 min  Madison Channel, NP Palliative Medicine Team Pager [phone] (Please see [email or web address] for schedule) Team Phone [phone]    Greater than 50%  of this time was spent counseling and coordinating care related to the above assessment and plan

## 2023-05-15 NOTE — TOC Progression Note (Signed)
Transition of Care [facility]) - Progression Note    Patient Details  Name: Madison Ho MRN: [id number] Date of Birth: 1951-01-06  [facility] of [facility]) CM/SW Contact  Carley Hammed, LCSW Phone Number: 05/15/2023, 4:12 PM  Clinical Narrative:    CSW met with pt and dtr at bedside, son on video phone. Bed offers provided and questions answered. Dtr to discuss with son and contact CSW with bed choice. CSW updated medical team to ongoing barriers. TOC will continue to follow for DC needs.    Expected Discharge Plan: Skilled Nursing Facility Barriers to Discharge: Continued Medical Work up  Expected Discharge Plan and Services In-house Referral: Clinical Social Work Discharge Planning Services: CM Consult Post Acute Care Choice: Home Health Living arrangements for the past 2 months: Single Family Home                 DME Arranged: N/A         HH Arranged: PT HH Agency: [facility] Date HH Agency Contacted: 05/11/23 Time HH Agency Contacted: 1145 Representative spoke with at [facility] Agency: Tresa Endo   Social Determinants of Health (SDOH) Interventions SDOH Screenings   Food Insecurity: No Food Insecurity (05/08/2023)  Housing: Low Risk  (05/08/2023)  Transportation Needs: No Transportation Needs (05/08/2023)  Utilities: Not At Risk (05/08/2023)  Alcohol Screen: Low Risk  (11/10/2020)  Depression (PHQ2-9): Low Risk  (11/10/2020)  Financial Resource Strain: Low Risk  (11/10/2020)  Physical Activity: Inactive (11/10/2020)  Social Connections: Moderately Isolated (11/10/2020)  Stress: No Stress Concern Present (11/10/2020)  Tobacco Use: High Risk (05/07/2023)    Readmission Risk Interventions     No data to display

## 2023-05-15 NOTE — Progress Notes (Signed)
SLP Cancellation Note  Patient Details Name: Madison Ho MRN: [id number] DOB: 05-Oct-1950   Cancelled treatment:       Reason Eval/Treat Not Completed: Patient at procedure or test/unavailable. Planned MBS but pt going to [facility] for a test. Will f/u for MBS readiness.    Chastity Noland, Riley Nearing 05/15/2023, 1:43 PM

## 2023-05-16 ENCOUNTER — Inpatient Hospital Stay (HOSPITAL_COMMUNITY): Payer: Medicare PPO

## 2023-05-16 DIAGNOSIS — R918 Other nonspecific abnormal finding of lung field: Secondary | ICD-10-CM | POA: Diagnosis not present

## 2023-05-16 LAB — RENAL FUNCTION PANEL
Albumin: 2.4 g/dL — ABNORMAL LOW (ref 3.5–5.0)
Anion gap: 14 (ref 5–15)
BUN: 20 mg/dL (ref 8–23)
CO2: 30 mmol/L (ref 22–32)
Calcium: 9.5 mg/dL (ref 8.9–10.3)
Chloride: 95 mmol/L — ABNORMAL LOW (ref 98–111)
Creatinine, Ser: 1.03 mg/dL — ABNORMAL HIGH (ref 0.44–1.00)
GFR, Estimated: 57 mL/min — ABNORMAL LOW (ref >60)
Glucose, Bld: 100 mg/dL — ABNORMAL HIGH (ref 70–99)
Phosphorus: 4.8 mg/dL — ABNORMAL HIGH (ref 2.5–4.6)
Potassium: 4.5 mmol/L (ref 3.5–5.1)
Sodium: 139 mmol/L (ref 135–145)

## 2023-05-16 LAB — CBC WITH DIFFERENTIAL/PLATELET
Abs Immature Granulocytes: 0.12 10*3/uL — ABNORMAL HIGH (ref 0.00–0.07)
Basophils Absolute: 0.1 10*3/uL (ref 0.0–0.1)
Basophils Relative: 1 %
Eosinophils Absolute: 0.3 10*3/uL (ref 0.0–0.5)
Eosinophils Relative: 3 %
HCT: 34.5 % — ABNORMAL LOW (ref 36.0–46.0)
Hemoglobin: 10.6 g/dL — ABNORMAL LOW (ref 12.0–15.0)
Immature Granulocytes: 1 %
Lymphocytes Relative: 22 %
Lymphs Abs: 2.2 10*3/uL (ref 0.7–4.0)
MCH: 30.2 pg (ref 26.0–34.0)
MCHC: 30.7 g/dL (ref 30.0–36.0)
MCV: 98.3 fL (ref 80.0–100.0)
Monocytes Absolute: 1.1 10*3/uL — ABNORMAL HIGH (ref 0.1–1.0)
Monocytes Relative: 11 %
Neutro Abs: 6.4 10*3/uL (ref 1.7–7.7)
Neutrophils Relative %: 62 %
Platelets: 409 10*3/uL — ABNORMAL HIGH (ref 150–400)
RBC: 3.51 MIL/uL — ABNORMAL LOW (ref 3.87–5.11)
RDW: 19.7 % — ABNORMAL HIGH (ref 11.5–15.5)
WBC: 10 10*3/uL (ref 4.0–10.5)
nRBC: 0 % (ref 0.0–0.2)

## 2023-05-16 LAB — GLUCOSE, CAPILLARY: Glucose-Capillary: 139 mg/dL — ABNORMAL HIGH (ref 70–99)

## 2023-05-16 MED ORDER — TRAMADOL HCL 50 MG PO TABS
50.0000 mg | ORAL_TABLET | Freq: Once | ORAL | Status: AC
  Administered 2023-05-16: 50 mg via ORAL
  Filled 2023-05-16: qty 1

## 2023-05-16 NOTE — Progress Notes (Signed)
Palliative:  I did not visit with Madison Ho today. Discussed over telephone with daughter, Madison Ho. Madison Ho shares that her mother is having a good day today. Radiation simulation went well but she was very tired yesterday. Madison Ho reports that her brother was here and they had plans to further discuss code status but Madison Ho was too tired - she does not even recall her son visiting yesterday per Madison Ho. I encouraged them to continue discussions and recommend that they consider DNR status. Madison Ho shares that they will continue to discuss as a family. Madison Ho inquires about [place] and I asked RN to assist with printing forms for registration at Madison Ho's request. Madison Ho has no further questions. Emotional support provided.   No charge  [name], NP Palliative Medicine Team Pager [phone] (Please see [email or web address] for schedule) Team Phone [phone]

## 2023-05-16 NOTE — Progress Notes (Signed)
SLP follow up for pt readiness for MBS, Pt alert this am and agreeable.  Will coordinate with xray for test completion. She consumed water without clinical indication during SLP brief visit.   Madison Infante, MS [facility] SLP Acute [employer] [phone]

## 2023-05-16 NOTE — TOC Progression Note (Signed)
Transition of Care [facility]) - Progression Note    Patient Details  Name: Madison Ho MRN: [id number] Date of Birth: [birth year]  Transition of Care [facility]) CM/SW Contact  Beckie Busing, RN Phone [phone]  05/16/2023, 12:25 PM  Clinical Narrative:    CM followed up with daughter Tammy for decision for facility. Tammy states that her brothers girlfriend wants to tour facilities and that decision has not been made. CM inquired if tours will happen today for choice. Tammy to check with sons girlfriend and get back with CM.    Expected Discharge Plan: Skilled Nursing Facility Barriers to Discharge: Continued Medical Work up  Expected Discharge Plan and Services In-house Referral: Clinical Social Work Discharge Planning Services: CM Consult Post Acute Care Choice: Home Health Living arrangements for the past 2 months: Single Family Home                 DME Arranged: N/A         HH Arranged: PT HH Agency: [facility] Date HH Agency Contacted: 05/11/23 Time HH Agency Contacted: 1145 Representative spoke with at [facility] Agency: Tresa Endo   Social Determinants of Health (SDOH) Interventions SDOH Screenings   Food Insecurity: No Food Insecurity (05/08/2023)  Housing: Low Risk  (05/08/2023)  Transportation Needs: No Transportation Needs (05/08/2023)  Utilities: Not At Risk (05/08/2023)  Alcohol Screen: Low Risk  (11/10/2020)  Depression (PHQ2-9): Low Risk  (11/10/2020)  Financial Resource Strain: Low Risk  (11/10/2020)  Physical Activity: Inactive (11/10/2020)  Social Connections: Moderately Isolated (11/10/2020)  Stress: No Stress Concern Present (11/10/2020)  Tobacco Use: High Risk (05/07/2023)    Readmission Risk Interventions     No data to display

## 2023-05-16 NOTE — Progress Notes (Signed)
[facility] PROGRESS NOTE  Madison Ho (DOB: [birth year]) YPP:[id number] PCP: Ignatius Specking, MD  Brief Narrative: Madison Ho is a 72 y.o. female with a history of tobacco use, HTN, HLD, T2DM, obesity (BMI 32) who presented to the ED on 05/07/2023 with left shoulder pain, 40 pound weight loss, failure to thrive. She had low grade fever, tachycardia and hypotension responsive to IV fluid. Work up included CT which revealed a large left lung mass concerning for primary lung malignancy. Biopsy of LUL 10/9 was consistent with squamous cell lung cancer. Imaging showing local invasion, right adrenal metastases, and a solitary 8mm intracranial metastasis on MRI. Oncology was consulted, tests for Foundation One, PD-L1 testing sent. Radiation oncology was consulted and will initiate palliative XRT.   Palliative care has also been consulted as the patient's performance status may limit her treatment options and treatments would be palliative in nature.  On 10/14, the patient demonstrated aspiration. Made NPO, SLP evaluation requested. MBSS carried out 10/16.   Subjective: Pt actually has very few complaints. Her left shoulder is at its baseline. Denied dyspnea. Daughter at bedside states she's much more alert today.   Objective: BP 127/87 (BP Location: Right Arm)   Pulse 87   Temp 98.2 F (36.8 C) (Oral)   Resp 18   Ht 5\' 5"  (1.651 m)   Wt 87.3 kg   SpO2 94%   BMI 32.03 kg/m   Gen: Frail female in no distress Pulm: Nonlabored with supplemental oxygen applied, diminished at bases.   CV: RRR, no MRG, no significant edema GI: Soft, NT, ND, +BS Neuro: Alert and oriented. No new focal deficits. Ext: Warm, no deformities. Skin: No open wounds noted.   Assessment & Plan: Principal Problem:   Mass of upper lobe of left lung Active Problems:   Sepsis (HCC)   UTI (urinary tract infection)   AKI (acute kidney injury) (HCC)   Transient hypotension   Left shoulder pain    Hypomagnesemia   Hyperlipidemia associated with type 2 diabetes mellitus (HCC)   Compression fracture of body of thoracic vertebra (HCC)   Debility   Tobacco abuse   Mediastinal mass   Mass of left lung   DNR (do not resuscitate) discussion   Goals of care, counseling/discussion   Non-small cell lung cancer metastatic to adrenal gland (HCC)   Metastasis to brain (HCC)   Normocytic anemia   Folate deficiency   Low serum vitamin B12  Newly diagnosed LUL squamous cell lung cancer: [id number].  - Oncology consulted, signed off, and palliative care are following still. - Will follow up with oncology after discharge, Dr. Arbutus Ped.  - Radiation oncology planning 10 fractions of palliative chest radiation.   Acute on chronic hypoxic respiratory failure, COPD, tobacco use: Suspect progressive decrease in pulmonary reserve due to tumor. - Continue inhaled bronchodilators and oxygen supplementation if needed. Exam not consistent with acute COPD exacerbation at this time.  - Due to concern for aspiration, was transiently on antibiotics, discontinued 10/14. SLP evaluation with MBS 10/16 > continue dysphagia diet as long as she's alert.   Brain metastasis: In absence of cerebral edema, holding steroids and prophylactic AED. Has been discussed in brain oncology conference. - Single fraction SRS considered, planning 3T MRI first.  Right adrenal metastasis: 3.2 x 2.2cm.    Deconditioning:  - Will require SNF rehabilitation. TOC is in the process of arranging this with the family.   Acute metabolic encephalopathy: Likely element of delirium, improved as of 10/16. Also improved  after stopping multiple sedating medications - Delirium precautions.   Chronic left shoulder pain, chronic T9 compression fracture:  - Minimized sedating medications. Give tylenol and lidocaine patch.   T2DM: Per report. Has a HbA1c of 5.8%.  - Holding metformin during admission.    Obesity: Body mass index is 32.03 kg/m.    AKI, hyperkalemia: Improved  Presumed sepsis on admission due to UTI. Is not currently septic.   Tyrone Nine, MD [facility] [email or web address] 05/16/2023, 12:50 PM

## 2023-05-16 NOTE — Progress Notes (Signed)
Physical Therapy Treatment Patient Details Name: Madison Ho MRN: [id number] DOB: 1950-08-18 Today's Date: 05/16/2023   History of Present Illness Patient is 72 y.o. female presented to the [facility] 05/07/23 w/ arm and neck pain for ~2 months with recent weight loss and FTT. Pt found to have sepsis due to UTI, metastatic squamous cell carcinoma from left upper lobe mass with additional R adrenal metastases and 8mm mass at  the anterior/inferior aspect of the left lentiform nucleus. PMH significant for DM II, diabetic neuropathy,  HTN, HLD.    PT Comments  Pt admitted with above diagnosis.  Pt currently with functional limitations due to the deficits listed below (see PT Problem List). Pt in bed when therapist arrived. Family member present. Pt on 3 L/min and o2 saturation monitored throughout intervention fluctuating from 90% at rest to 83% s/p standing trial of 25s. Pt coached and educated on pursed lip breathing with pt unable to perform return demonstration and electing to breathe  through mouth. Pt will benefit from ongoing ed on breathing strategies. Pt reported R hip and L shoulder pain. Pt required strong cues for active participation during therapy session. Pt reported dizziness with sitting EOB and with standing trial. Pt required max A x 2 for supine to sit, exhibited F- sitting balance EOB with posterior lean, pt required max A x 2 for sit to stand from EOB and mod A x 2 to maintain static standing, pt required max A x 2 for supine to sit and total assist for positioning in bed with use of pillows as props. Pt left in bed all needs in place visitor present. Pt will benefit from acute skilled PT to increase their independence and safety with mobility to allow discharge.       If plan is discharge home, recommend the following: A lot of help with bathing/dressing/bathroom;Assist for transportation;Help with stairs or ramp for entrance;Assistance with cooking/housework;Two people to help with  walking and/or transfers   Can travel by private vehicle     No  Equipment Recommendations  None recommended by PT    Recommendations for Other Services       Precautions / Restrictions Precautions Precautions: Fall Precaution Comments: 3L O2 Restrictions Weight Bearing Restrictions: No     Mobility  Bed Mobility Overal bed mobility: Needs Assistance Bed Mobility: Supine to Sit, Sit to Supine Rolling: Max assist   Supine to sit: HOB elevated, Used rails, Max assist Sit to supine: Max assist, +2 for physical assistance, +2 for safety/equipment   General bed mobility comments: pt required extensive assist for bed mobility tasks, pt limited due to L shoulder pain and R hip pain, pt appears to limit participation requiring cues for head turns to look in direction of rolling or bed rail to assist with mobility    Transfers Overall transfer level: Needs assistance Equipment used: Rolling walker (2 wheels) Transfers: Sit to/from Stand Sit to Stand: From elevated surface, +2 safety/equipment, +2 physical assistance, Max assist           General transfer comment: pt indicated that she was unable to stand, pt required max cues for encouragement, strong cues for posture and exhibited pushing response, pt able to maintain static standing with mod A x 2 and RW for ~ 25s    Ambulation/Gait               General Gait Details: NT for pt and staff safety   Stairs  Wheelchair Mobility     Tilt Bed    Modified Rankin (Stroke Patients Only)       Balance Overall balance assessment: Needs assistance Sitting-balance support: Bilateral upper extremity supported Sitting balance-Leahy Scale: Fair Sitting balance - Comments: cues for encouragement to sit EOB, trunk flexion, attention to midline pt indicating dizziness and SOB, PT coached pt on pursed lip breathing however pt continues to elect to breath through mouth limiting the benefits of supplemental  o2 at 3 L/min Postural control: Right lateral lean Standing balance support: Bilateral upper extremity supported, During functional activity, Reliant on assistive device for balance Standing balance-Leahy Scale: Zero Standing balance comment: reliant on RW and assist from therapist using gait belt and minimal initiation to assist with strong cues                            Cognition Arousal: Alert Behavior During Therapy: Flat affect Overall Cognitive Status: Within Functional Limits for tasks assessed                                 General Comments: Requires repeated verbal cueing and encouragement for  initiation and participation.        Exercises      General Comments        Pertinent Vitals/Pain Pain Assessment Pain Assessment: Faces Faces Pain Scale: Hurts even more Pain Location: L UE/shoulder and R hip Pain Descriptors / Indicators: Sore, Constant, Discomfort Pain Intervention(s): Limited activity within patient's tolerance, Monitored during session, Repositioned    Home Living Family/patient expects to be discharged to:: Private residence Living Arrangements: Children;Other relatives Available Help at Discharge: Family;Available 24 hours/day Type of Home: House Home Access: Stairs to enter Entrance Stairs-Rails: Right Entrance Stairs-Number of Steps: 7   Home Layout: One level Home Equipment: Rollator (4 wheels);Cane - quad;Cane - single point;BSC/3in1      Prior Function            PT Goals (current goals can now be found in the care plan section) Acute Rehab PT Goals Patient Stated Goal: per family: to build stregnth for treatment PT Goal Formulation: With patient/family Time For Goal Achievement: 05/31/23 Potential to Achieve Goals: Fair    Frequency    Min 1X/week      PT Plan      Co-evaluation              AM-PAC PT "6 Clicks" Mobility   Outcome Measure  Help needed turning from your back to your  side while in a flat bed without using bedrails?: A Lot Help needed moving from lying on your back to sitting on the side of a flat bed without using bedrails?: A Lot Help needed moving to and from a bed to a chair (including a wheelchair)?: A Lot Help needed standing up from a chair using your arms (e.g., wheelchair or bedside chair)?: Total Help needed to walk in hospital room?: Total Help needed climbing 3-5 steps with a railing? : Total 6 Click Score: 9    End of Session Equipment Utilized During Treatment: Gait belt;Oxygen Activity Tolerance: Patient limited by fatigue Patient left: in bed;with chair alarm set;with family/visitor present;with call bell/phone within reach Nurse Communication: Mobility status PT Visit Diagnosis: Muscle weakness (generalized) (M62.81);Unsteadiness on feet (R26.81)     Time: 7829-5621 PT Time Calculation (min) (ACUTE ONLY): 23 min  Charges:    $  Therapeutic Activity: 23-37 mins PT General Charges $$ ACUTE PT VISIT: 1 Visit                     Johnny Bridge, PT Acute Rehab    Jacqualyn Posey 05/16/2023, 2:38 PM

## 2023-05-16 NOTE — Progress Notes (Signed)
Modified Barium Swallow Study  Patient Details  Name: Madison Ho MRN: [id number] Date of Birth: [birth year]  Today's Date: 05/16/2023  HPI/PMH: HPI: 72 year old home dwelling female admitted and found to have lung mass and now brain mets. Pt observed to cough with meals. PMH: HTN HLD DM TY 2 BMI 30  Prior smoker until about 2 months ago  Came to emergency room 10/8 with left shoulder pain 40 pound weight loss failure to thrive low-grade fever temp tachycardia and hypotension responsive to IV fluid . CT scan = large left lung mass concerning for primary lung malignancy-  10/9 biopsy left upper lobe showing metastatic squamous cell CA   10/11 oncology consult 10-CT 4N2M1 SCC lung. MRI brain shows Solitary 8 mm intracranial metastasis at the anterior/inferior  aspect of the left lentiform nucleus. No significant mass effect.  CXR showed extensive left upper lobe  lung mass with veil like opacification over the left lower lobe.  2. Signs of venous congestion within the right lung.   Clinical Impression: Clinical Impression: Patient presents with mild oropharyngeal dysphagia mostly characterized by impaired lingual control resulting in minimal mastication with solids and oral retention with liquid.  Pharyngeal swallow marked by impaired laryngeal elevation and closure allowing aspiration of thin liquid prompting weak nonproductive cough.  Chin tuck posture with thin prevented aspiration and allowed minimal laryngeal penetration that cleared with cued cough.  Fortunately pharyngeal swallow is strong without retention.  At this time recommend continue pure and nectar thick liquid diet allowing thin liquid between meals with chin tuck posture technique.  Patient did not have dentures in place for testing and suspect that they may have assisted her mastication abilities therefore we will follow-up regarding solids.  Of note barium tablet appeared to halt at mid esophagus without patient awareness.  Further  boluses of pudding and nectar thick liquid did not effectively clear.  Following examination patient reported occasional sensation of lodging of food and liquid in her pharynx, pointing distally, prompting her to cough and expectorate at times.  Suspect retention in esophagus is source of those said symptoms.  Factors that may increase risk of adverse event in presence of aspiration Rubye Oaks & Clearance Coots 2021): Factors that may increase risk of adverse event in presence of aspiration Rubye Oaks & Clearance Coots 2021): Poor general health and/or compromised immunity   Recommendations/Plan: Swallowing Evaluation Recommendations Swallowing Evaluation Recommendations Recommendations: PO diet PO Diet Recommendation: Dysphagia 1 (Pureed); Mildly thick liquids (Level 2, nectar thick) (thin liquids between meals - with chin tuck) Liquid Administration via: Straw; Cup Medication Administration: Whole meds with puree Supervision: Intermittent supervision/cueing for swallowing strategies Swallowing strategies  : Small bites/sips; Slow rate; Chin tuck (stop intake if pt coughing, chin tuck with sips) Postural changes: Stay upright 30-60 min after meals; Position pt fully upright for meals Oral care recommendations: Oral care BID (2x/day)    Treatment Plan Treatment Plan Treatment recommendations: Therapy as outlined in treatment plan below Follow-up recommendations: Skilled nursing-short term rehab (<3 hours/day)     Recommendations Recommendations for follow up therapy are one component of a multi-disciplinary discharge planning process, led by the attending physician.  Recommendations may be updated based on patient status, additional functional criteria and insurance authorization.  Assessment: Orofacial Exam: Orofacial Exam Oral Cavity: Oral Hygiene: Xerostomia Oral Cavity - Dentition: Dentures, not available (ill fitting dentures per note) Orofacial Anatomy: WFL Oral Motor/Sensory Function:  WFL    Anatomy:  No data recorded  Boluses Administered: Boluses Administered Boluses Administered: Thin liquids (Level  0); Mildly thick liquids (Level 2, nectar thick); Puree; Moderately thick liquids (Level 3, honey thick); Solid     Oral Impairment Domain: Oral Impairment Domain Lip Closure: No labial escape Tongue control during bolus hold: Cohesive bolus between tongue to palatal seal Bolus preparation/mastication: Slow prolonged chewing/mashing with complete recollection Bolus transport/lingual motion: Delayed initiation of tongue motion (oral holding) Oral residue: Trace residue lining oral structures Location of oral residue : Tongue Initiation of pharyngeal swallow : Pyriform sinuses; Posterior laryngeal surface of the epiglottis     Pharyngeal Impairment Domain: Pharyngeal Impairment Domain Soft palate elevation: No bolus between soft palate (SP)/pharyngeal wall (PW) Laryngeal elevation: Partial superior movement of thyroid cartilage/partial approximation of arytenoids to epiglottic petiole Anterior hyoid excursion: Partial anterior movement Epiglottic movement: Complete inversion Laryngeal vestibule closure: Incomplete, narrow column air/contrast in laryngeal vestibule Pharyngeal stripping wave : Present - complete Pharyngeal contraction (A/P view only): N/A Pharyngoesophageal segment opening: Complete distension and complete duration, no obstruction of flow Tongue base retraction: Trace column of contrast or air between tongue base and PPW Pharyngeal residue: Trace residue within or on pharyngeal structures Location of pharyngeal residue: Valleculae; Tongue base     Esophageal Impairment Domain: Esophageal Impairment Domain Esophageal clearance upright position: Esophageal retention (barium tablet appeared to halt at mid-esophageal region without pt awareness,  this did  not clear with)    Pill: Pill Consistency administered: Puree Puree:  WFL    Penetration/Aspiration Scale Score: Penetration/Aspiration Scale Score 1.  Material does not enter airway: Moderately thick liquids (Level 3, honey thick); Puree; Solid; Pill 2.  Material enters airway, remains ABOVE vocal cords then ejected out: Mildly thick liquids (Level 2, nectar thick) 3.  Material enters airway, remains ABOVE vocal cords and not ejected out: Mildly thick liquids (Level 2, nectar thick) 7.  Material enters airway, passes BELOW cords and not ejected out despite cough attempt by patient: Thin liquids (Level 0)    Compensatory Strategies: Compensatory Strategies Compensatory strategies: Yes Chin tuck: Effective Effective Chin Tuck: Thin liquid (Level 0)       General Information: Caregiver present: Yes   Diet Prior to this Study: Dysphagia 1 (pureed); Mildly thick liquids (Level 2, nectar thick) (md placed her on this diet)    Temperature : Normal    Respiratory Status: WFL    Supplemental O2: Nasal cannula    History of Recent Intubation: No   Behavior/Cognition: Alert; Cooperative  Self-Feeding Abilities: Able to self-feed  Baseline vocal quality/speech: Normal  Volitional Cough: Able to elicit  Volitional Swallow: Able to elicit  Exam Limitations: No limitations   Goal Planning: Prognosis for improved oropharyngeal function: Guarded  No data recorded No data recorded Patient/Family Stated Goal: none stated  No data recorded  Pain: Pain Assessment Pain Assessment: No/denies pain Faces Pain Scale: 6 Pain Location: L upper rib area Pain Descriptors / Indicators: Sore Pain Intervention(s): Monitored during session    End of Session: Start Time:SLP Start Time (ACUTE ONLY): 0900  Stop Time: SLP Stop Time (ACUTE ONLY): 0921  Time Calculation:SLP Time Calculation (min) (ACUTE ONLY): 21 min  Charges: SLP Evaluations $ SLP Speech Visit: 1 Visit  SLP Evaluations $MBS Swallow: 1 Procedure $Swallowing Treatment: 1  Procedure   SLP visit diagnosis: SLP Visit Diagnosis: Dysphagia, unspecified (R13.10)    Past Medical History:  Past Medical History:  Diagnosis Date   Diabetes mellitus without complication (HCC)    Encounter for hepatitis C screening test for low risk patient 08/30/2019   Encounter for  imaging to assess osteoporosis 08/30/2019   Encounter for screening mammogram for malignant neoplasm of breast 08/30/2019   Hyperlipidemia    Hypertension    Type 2 diabetes mellitus with neurological complications (HCC) 08/30/2019   Past Surgical History:  Past Surgical History:  Procedure Laterality Date   EYE SURGERY N/A    [employer] 11/10/2020   LEG SURGERY     TUBAL LIGATION    Rolena Infante, MS [facility] SLP Acute Rehab Services Office [phone]   Chales Abrahams 05/16/2023, 9:46 AM

## 2023-05-16 NOTE — Progress Notes (Signed)
This RN was called to patients room @ 18:55 & requested to use BSC to have a BM. I explained that per report & PT notes it would be best if we helped her to use bedpan d/t her difficulty standing w/ PT assistance this am & worsening weakness/unsteady. Patient & 2 family members were very adamant on using BSC despite me going into detail about the importance of keeping both herself & staff safe & prevent injuries/falls. Patient family members put down bed rail & did max.assist (lift) patient to [facility] & back to bed. This RN Community education officer whom came to pt bedside as well & discussed the importance of using appropriate toileting devices in future to avoid any injury/fall that could be prevented to both patient & or staff members.

## 2023-05-16 NOTE — Plan of Care (Signed)

## 2023-05-17 ENCOUNTER — Inpatient Hospital Stay (HOSPITAL_COMMUNITY): Payer: Medicare PPO

## 2023-05-17 ENCOUNTER — Ambulatory Visit
Admit: 2023-05-17 | Discharge: 2023-05-17 | Disposition: A | Discharge status: Home / Self Care | Payer: Medicare PPO | Attending: Radiation Oncology | Admitting: Radiation Oncology

## 2023-05-17 ENCOUNTER — Other Ambulatory Visit: Payer: Self-pay

## 2023-05-17 DIAGNOSIS — R609 Edema, unspecified: Secondary | ICD-10-CM | POA: Diagnosis not present

## 2023-05-17 DIAGNOSIS — C7970 Secondary malignant neoplasm of unspecified adrenal gland: Secondary | ICD-10-CM

## 2023-05-17 DIAGNOSIS — Z515 Encounter for palliative care: Secondary | ICD-10-CM | POA: Diagnosis not present

## 2023-05-17 DIAGNOSIS — R222 Localized swelling, mass and lump, trunk: Secondary | ICD-10-CM

## 2023-05-17 DIAGNOSIS — R5383 Other fatigue: Secondary | ICD-10-CM

## 2023-05-17 DIAGNOSIS — R918 Other nonspecific abnormal finding of lung field: Secondary | ICD-10-CM | POA: Diagnosis not present

## 2023-05-17 DIAGNOSIS — Z7189 Other specified counseling: Secondary | ICD-10-CM | POA: Diagnosis not present

## 2023-05-17 LAB — CBC WITH DIFFERENTIAL/PLATELET
Abs Immature Granulocytes: 0.13 10*3/uL — ABNORMAL HIGH (ref 0.00–0.07)
Basophils Absolute: 0.1 10*3/uL (ref 0.0–0.1)
Basophils Relative: 0 %
Eosinophils Absolute: 0.2 10*3/uL (ref 0.0–0.5)
Eosinophils Relative: 2 %
HCT: 36.2 % (ref 36.0–46.0)
Hemoglobin: 10.9 g/dL — ABNORMAL LOW (ref 12.0–15.0)
Immature Granulocytes: 1 %
Lymphocytes Relative: 15 %
Lymphs Abs: 1.8 10*3/uL (ref 0.7–4.0)
MCH: 29.7 pg (ref 26.0–34.0)
MCHC: 30.1 g/dL (ref 30.0–36.0)
MCV: 98.6 fL (ref 80.0–100.0)
Monocytes Absolute: 1.1 10*3/uL — ABNORMAL HIGH (ref 0.1–1.0)
Monocytes Relative: 9 %
Neutro Abs: 8.2 10*3/uL — ABNORMAL HIGH (ref 1.7–7.7)
Neutrophils Relative %: 73 %
Platelets: 412 10*3/uL — ABNORMAL HIGH (ref 150–400)
RBC: 3.67 MIL/uL — ABNORMAL LOW (ref 3.87–5.11)
RDW: 19.6 % — ABNORMAL HIGH (ref 11.5–15.5)
WBC: 11.4 10*3/uL — ABNORMAL HIGH (ref 4.0–10.5)
nRBC: 0.2 % (ref 0.0–0.2)

## 2023-05-17 LAB — RENAL FUNCTION PANEL
Albumin: 2.2 g/dL — ABNORMAL LOW (ref 3.5–5.0)
Anion gap: 12 (ref 5–15)
BUN: 24 mg/dL — ABNORMAL HIGH (ref 8–23)
CO2: 30 mmol/L (ref 22–32)
Calcium: 9 mg/dL (ref 8.9–10.3)
Chloride: 93 mmol/L — ABNORMAL LOW (ref 98–111)
Creatinine, Ser: 0.88 mg/dL (ref 0.44–1.00)
GFR, Estimated: >60 mL/min (ref >60)
Glucose, Bld: 120 mg/dL — ABNORMAL HIGH (ref 70–99)
Phosphorus: 4.5 mg/dL (ref 2.5–4.6)
Potassium: 4.4 mmol/L (ref 3.5–5.1)
Sodium: 135 mmol/L (ref 135–145)

## 2023-05-17 LAB — RAD ONC ARIA SESSION SUMMARY
Course Elapsed Days: 0
Plan Fractions Treated to Date: 1
Plan Prescribed Dose Per Fraction: 3 Gy
Plan Total Fractions Prescribed: 10
Plan Total Prescribed Dose: 30 Gy
Reference Point Dosage Given to Date: 3 Gy
Reference Point Session Dosage Given: 3 Gy
Session Number: 1

## 2023-05-17 LAB — GLUCOSE, CAPILLARY
Glucose-Capillary: 91 mg/dL (ref 70–99)
Glucose-Capillary: 131 mg/dL — ABNORMAL HIGH (ref 70–99)
Glucose-Capillary: 139 mg/dL — ABNORMAL HIGH (ref 70–99)
Glucose-Capillary: 138 mg/dL — ABNORMAL HIGH (ref 70–99)

## 2023-05-17 MED ORDER — TRAMADOL HCL 50 MG PO TABS
50.0000 mg | ORAL_TABLET | Freq: Once | ORAL | Status: AC
  Administered 2023-05-17: 50 mg via ORAL
  Filled 2023-05-17: qty 1

## 2023-05-17 MED ORDER — ENOXAPARIN SODIUM 80 MG/0.8ML IJ SOSY
80.0000 mg | PREFILLED_SYRINGE | Freq: Two times a day (BID) | INTRAMUSCULAR | Status: DC
  Administered 2023-05-17 – 2023-05-28 (×22): 80 mg via SUBCUTANEOUS
  Filled 2023-05-17 (×23): qty 0.8

## 2023-05-17 MED ORDER — ORAL CARE MOUTH RINSE
15.0000 mL | OROMUCOSAL | Status: DC
  Administered 2023-05-17 – 2023-05-28 (×21): 15 mL via OROMUCOSAL

## 2023-05-17 MED ORDER — TRAMADOL HCL 50 MG PO TABS
50.0000 mg | ORAL_TABLET | Freq: Four times a day (QID) | ORAL | Status: DC | PRN
  Administered 2023-05-17 – 2023-05-27 (×18): 50 mg via ORAL
  Filled 2023-05-17 (×19): qty 1

## 2023-05-17 MED ORDER — ORAL CARE MOUTH RINSE: 15.0000 mL | OROMUCOSAL | Status: DC | PRN

## 2023-05-17 NOTE — Progress Notes (Signed)
[facility] PROGRESS NOTE  Madison Ho (DOB: March 23, 1951) ZOX:[id number] PCP: Ignatius Specking, MD  Brief Narrative: Madison Ho is a 72 y.o. female with a history of tobacco use, HTN, HLD, T2DM, obesity (BMI 32) who presented to the ED on 05/07/2023 with left shoulder pain, 40 pound weight loss, failure to thrive. She had low grade fever, tachycardia and hypotension responsive to IV fluid. Work up included CT which revealed a large left lung mass concerning for primary lung malignancy. Biopsy of LUL 10/9 was consistent with squamous cell lung cancer. Imaging showing local invasion, right adrenal metastases, and a solitary 8mm intracranial metastasis on MRI. Oncology was consulted, tests for Foundation One, PD-L1 testing sent. Radiation oncology was consulted and will initiate palliative XRT.   Palliative care has also been consulted as the patient's performance status may limit her treatment options and treatments would be palliative in nature.  On 10/14, the patient demonstrated aspiration. Made NPO, SLP evaluation requested. MBSS carried out 10/16.   Subjective: Level of alertness is still improved. Left arm has become more swollen, no other complaints. She is 2 person maximum assistance to sit and stand in place.   Objective: BP (!) 140/77 (BP Location: Right Arm)   Pulse (!) 102   Temp 98.3 F (36.8 C) (Oral)   Resp 18   Ht 5\' 5"  (1.651 m)   Wt 82.5 kg   SpO2 96%   BMI 30.27 kg/m   Gen: Frail elderly female in no distress HEENT: No facial asymmetry/edema Pulm: Diminished laterally, no wheezes, nonlabored with supplemental oxygen  CV: Regular, borderline tachycardia, no MRG.  GI: Soft, NT, ND, +BS Neuro: Alert and interactive, diffusely very weak with no new focal deficits. Ext: Warm, dry, LUE edematous nontender diffusely Skin: No new rashes, lesions or ulcers on visualized skin   Assessment & Plan: Principal Problem:   Mass of upper lobe of left lung Active  Problems:   Sepsis (HCC)   UTI (urinary tract infection)   AKI (acute kidney injury) (HCC)   Transient hypotension   Left shoulder pain   Hypomagnesemia   Hyperlipidemia associated with type 2 diabetes mellitus (HCC)   Compression fracture of body of thoracic vertebra (HCC)   Debility   Tobacco abuse   Mediastinal mass   Mass of left lung   DNR (do not resuscitate) discussion   Goals of care, counseling/discussion   Non-small cell lung cancer metastatic to adrenal gland (HCC)   Metastasis to brain (HCC)   Normocytic anemia   Folate deficiency   Low serum vitamin B12  Newly diagnosed LUL squamous cell lung cancer: [id number].  - Oncology consulted, signed off, and palliative care are following still. - Will follow up with oncology after discharge, Dr. Arbutus Ped.  - Radiation oncology planning 10 fractions of palliative chest radiation.   Acute on chronic hypoxic respiratory failure, COPD, tobacco use: Suspect progressive decrease in pulmonary reserve due to tumor. - Continue inhaled bronchodilators and oxygen supplementation if needed. Exam not consistent with acute COPD exacerbation at this time.  - Due to concern for aspiration, was transiently on antibiotics, discontinued 10/14. SLP evaluation with MBS 10/16 > continue dysphagia 1 diet, nectar thickened liquids.  Brain metastasis: In absence of cerebral edema, holding steroids and prophylactic AED. Has been discussed in brain oncology conference. - Single fraction SRS considered, planning 3T MRI first.  Right adrenal metastasis: 3.2 x 2.2cm.    Deconditioning:  - Will require SNF rehabilitation. TOC is in the process of arranging  this with the family.   Left arm edema: This extremity is more immobile due to shoulder discomfort, DDx primarily DVT vs. extrinsic veinous compression from tumor.  - Venous U/S - Elevate as able - Continue heparin VTE ppx for now (has been getting)  Acute metabolic encephalopathy: Likely element of  delirium, improved as of 10/16. Also improved after stopping multiple sedating medications - Delirium precautions.   Chronic left shoulder pain, chronic T9 compression fracture:  - Minimized sedating medications. Give tylenol and lidocaine patch.   T2DM: Per report. Has a HbA1c of 5.8%.  - Holding metformin during admission.    Obesity: Body mass index is 30.27 kg/m.   AKI, hyperkalemia: Improved  Presumed sepsis on admission due to UTI. Is not currently septic.   Tyrone Nine, MD [facility] [email or web address] 05/17/2023, 9:15 AM

## 2023-05-17 NOTE — Progress Notes (Signed)
PHARMACY - ANTICOAGULATION CONSULT NOTE  Pharmacy Consult for Lovenox Indication: DVT  Allergies  Allergen Reactions   Penicillins Swelling    Has taken cipro with no problems    Patient Measurements: Height: 5\' 5"  (165.1 cm) Weight: 82.5 kg (181 lb 14.1 oz) IBW/kg (Calculated) : 57 Heparin Dosing Weight: 75.2 kg  Vital Signs: Temp: 98.5 F (36.9 C) (10/17 1404) Temp Source: Oral (10/17 1404) BP: 128/77 (10/17 1404) Pulse Rate: 107 (10/17 1404)  Labs: Recent Labs    05/16/23 0537 05/17/23 0538  HGB 10.6* 10.9*  HCT 34.5* 36.2  PLT 409* 412*  CREATININE 1.03* 0.88    Estimated Creatinine Clearance: 60.4 mL/min (by C-G formula based on SCr of 0.88 mg/dL).   Medical History: Past Medical History:  Diagnosis Date   Diabetes mellitus without complication (HCC)    Encounter for hepatitis C screening test for low risk patient 08/30/2019   Encounter for imaging to assess osteoporosis 08/30/2019   Encounter for screening mammogram for malignant neoplasm of breast 08/30/2019   Hyperlipidemia    Hypertension    Type 2 diabetes mellitus with neurological complications (HCC) 08/30/2019    Medications:  Medications Prior to Admission  Medication Sig Dispense Refill Last Dose   lidocaine (LIDODERM) 5 % Place 1 patch onto the skin daily. Remove & Discard patch within 12 hours or as directed by MD 30 patch 0 unknown   metFORMIN (GLUCOPHAGE) 500 MG tablet TAKE 1 TABLET WITH BREAKFAST, WITH LUNCH, AND WITH EVENING MEAL 270 tablet 1 Past Month   ACCU-CHEK AVIVA PLUS test strip TEST BLOOD SUGAR EVERY DAY 100 strip 0    Accu-Chek Softclix Lancets lancets TEST BLOOD SUGAR EVERY DAY 100 each 0    Alcohol Swabs (B-D SINGLE USE SWABS REGULAR) PADS USE AS DIRECTED EVERY DAY 100 each 0    Blood Glucose Calibration (ACCU-CHEK AVIVA) SOLN USE AS DIRECTED WITH GLUCOMETER 1 each 0    Blood Glucose Monitoring Suppl (ACCU-CHEK AVIVA PLUS) w/Device KIT       Lancets Misc. (ACCU-CHEK SOFTCLIX  LANCET DEV) KIT USE AS DIRECTED  TO CHECK BLOOD SUGAR ONE TIME DAILY 1 kit 0    Scheduled:   acetaminophen  1,000 mg Oral Q8H   vitamin B-12  1,000 mcg Oral Daily   feeding supplement  237 mL Oral BID BM   folic acid  1 mg Oral Daily   gabapentin  100 mg Oral BID   heparin  5,000 Units Subcutaneous Q8H   lidocaine  2 patch Transdermal Q24H   mouth rinse  15 mL Mouth Rinse 4 times per day   polyethylene glycol  17 g Oral Daily   senna-docusate  1 tablet Oral QHS   umeclidinium bromide  1 puff Inhalation Daily    Assessment: 72 YO female who presented to the ED 10/7 with left shoulder pain, found to have squamous cell lung cancer. Doppler positive for axillary DVT. Not on AC PTA. Patient has been on heparin [place] TID for DVT prophylaxis while inpatient--last dose 10/17 @1322 . Pharmacy consulted for Lovenox dosing.   Today, 05/17/23 Hgb 10.9, plts 412--stable Scr 0.88 (CrCl 60 ml/min) No s/sx of bleeding reported   Goal of Therapy:  Heparin level 0.3-0.7 units/ml Monitor platelets by anticoagulation protocol: Yes   Plan:  Discontinue  heparin  Start Lovenox 1mg /kg q12hrs  Monitor for s/sx of bleeding daily    Cherylin Mylar, PharmD Clinical Pharmacist  10/17/20243:38 PM

## 2023-05-17 NOTE — Progress Notes (Signed)
Patient is still c/o nausea even after giving IV zofran. Notified provider on call. Will continue to monitor.

## 2023-05-17 NOTE — Progress Notes (Signed)
Patient called out a few minutes after repositioning her. She c/o 10/10 pain "all over". Notified provider on call. New order for 50 mg PO ultram ordered and given. Will continue to monitor.

## 2023-05-17 NOTE — Plan of Care (Signed)
  Problem: Fluid Volume: Goal: Ability to maintain a balanced intake and output will improve Outcome: Progressing   Problem: Metabolic: Goal: Ability to maintain appropriate glucose levels will improve Outcome: Progressing   Problem: Nutritional: Goal: Maintenance of adequate nutrition will improve Outcome: Progressing Goal: Progress toward achieving an optimal weight will improve Outcome: Progressing   Problem: Skin Integrity: Goal: Risk for impaired skin integrity will decrease Outcome: Progressing   Problem: Education: Goal: Knowledge of General Education information will improve Description: Including pain rating scale, medication(s)/side effects and non-pharmacologic comfort measures Outcome: Progressing   Problem: Clinical Measurements: Goal: Will remain free from infection Outcome: Progressing Goal: Cardiovascular complication will be avoided Outcome: Progressing   Problem: Coping: Goal: Level of anxiety will decrease Outcome: Progressing   Problem: Elimination: Goal: Will not experience complications related to bowel motility Outcome: Progressing   Problem: Pain Managment: Goal: General experience of comfort will improve Outcome: Progressing   Problem: Skin Integrity: Goal: Risk for impaired skin integrity will decrease Outcome: Progressing

## 2023-05-17 NOTE — Plan of Care (Signed)

## 2023-05-17 NOTE — Progress Notes (Signed)
Daily Progress Note   Patient Name: Madison Ho       Date: 05/17/2023 DOB: [birth year]  Age: 72 y.o. MRN#: [id number] Attending Physician: Tyrone Nine, MD Primary Care Physician: Ignatius Specking, MD Admit Date: 05/07/2023 Length of Stay: 9 days  Reason for Consultation/Follow-up: Establishing goals of care  HPI/Patient Profile:  72 y.o. female  with past medical history of hypertension, hyperlipidemia, diabetes, neuropathy, obesity admitted on 05/07/2023 with significant weight loss, weakness, left shoulder pain and left chest swelling. Diagnosed with potential UTI and aspiration pneumonia but also with metastatic lung care to right adrenal, brain. Tentative plans for SRS radiation to brain but with ongoing decline will reassess goals of care before proceeding with treatment.   Subjective:   Subjective: Chart Reviewed. Updates received. Patient Assessed. Created space and opportunity for patient  and family to explore thoughts and feelings regarding current medical situation.  Today's Discussion: Today I saw the patient at the bedside, her daughter was also present.  We discussed the plan moving forward including plans for to start radiation treatment today.  Planning for 12 doses of radiation to the chest and 1 dose of the brain, which will occur after chest radiation.  We discussed the very tenuous situation that her mom is in.  As of right now the plan is to await foundation testing to come back and follow-up with oncology as an outpatient to discuss possible treatment options.  Depending on how she does moving forward with radiation will help inform available treatment options.  We discussed, and they understand, that treatment is more palliative in nature and there is no cure for her cancer.  We discussed at some point her mom with decline toward end-of-life and further discussions will need to be had.  If she has a decline with radiation treatment she may not be a candidate for chemotherapy  or immunotherapy, which they understand.  At this point they would like to continue full scope of care in an attempt to find care.  I encouraged continued discussion amongst family about DNR status and other options should there be a clinical decline.  All told him I would check back in in a couple days to see how she is doing after her first day or 2 of radiation.  Today the patient states she is having some chest and shoulder pain which is not surprising given her cancer.  Her daughter states that opioid medications tend to make her quite sleepy.  We discussed that Ultram has helped and not made her as sleepy.  I told her we would consider adding as needed Ultram for breakthrough pain.  They also note that her left arm has been a bit swollen and they are planning for an ultrasound today, which has not happened yet.  Finally we discussed referral to the outpatient palliative and symptom management clinic in the cancer center.  I explained the purpose of palliative care in the setting and I have excepted the referral.  I provided emotional and general support through therapeutic listening, empathy, sharing of stories, and other techniques. I answered all questions and addressed all concerns to the best of my ability.  Review of Systems  Constitutional:  Positive for fatigue.  Respiratory:  Negative for cough and shortness of breath.   Cardiovascular:        Upper chest and shoulder pain consistent with known lung cancer/tumor  Gastrointestinal:  Negative for abdominal pain, nausea and vomiting.  Neurological:  Positive for weakness.  Objective:   Vital Signs:  BP (!) 140/77 (BP Location: Right Arm)   Pulse (!) 102   Temp 98.3 F (36.8 C) (Oral)   Resp 18   Ht 5\' 5"  (1.651 m)   Wt 82.5 kg   SpO2 96%   BMI 30.27 kg/m   Physical Exam: Physical Exam Vitals and nursing note reviewed.  Constitutional:      General: She is sleeping. She is not in acute distress.    Appearance: She is  ill-appearing.  HENT:     Head: Normocephalic and atraumatic.  Cardiovascular:     Rate and Rhythm: Normal rate.  Pulmonary:     Effort: Pulmonary effort is normal. No respiratory distress.     Breath sounds: No wheezing or rhonchi.  Abdominal:     General: Bowel sounds are normal. There is no distension.     Palpations: Abdomen is soft.  Skin:    General: Skin is warm and dry.  Neurological:     General: No focal deficit present.     Mental Status: She is easily aroused.  Psychiatric:        Mood and Affect: Mood normal.        Behavior: Behavior normal.     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  Known cancer not curable but treatable.  Pending foundation 1 testing.  Options could include targeted immunotherapy, depending on how she does moving forward.  Plans to start radiation therapy today with 12 treatments to the lung and 1 treatment to the brain.  Plan for outpatient oncology follow-up.  If she does not do well or has a clinical decline in the coming days would need to rediscuss CODE STATUS and comfort care.  In the meantime we will take it a day at a time and refer her to outpatient palliative medicine in the cancer center.  Long-term prognosis poor.  SUMMARY OF RECOMMENDATIONS   Remain full code for now Continue full scope of care Plan for radiation treatment starting today Add Ultram 50 mg p.o. every 6 hours as needed breakthrough pain Outpatient palliative care  referral to the cancer center clinic Palliative medicine will follow-up in a couple days to see how patient is doing  Symptom Management:  Ultram 50 mg p.o. every 6 hours as needed Tylenol 1000 mg p.o. around-the-clock every 8 hours Further adjustments will be made as needed  Code Status: Full code  Prognosis: Unable to determine  Discharge Planning: To Be Determined  Discussed with: Patient, family, medical team, nursing team  Thank you for allowing [place] to participate in the care of Aphrodite Willeford PMT will continue to support holistically.  Time Total: 40 min  Detailed review of medical records (labs, imaging, vital signs), medically appropriate exam, discussed with treatment team, counseling and education to patient, family, & staff, documenting clinical information, medication management, coordination of care  Wynne Dust, NP Palliative Medicine Team  Team Phone # [phone] (Nights/Weekends)  03/29/2021, 8:17 AM

## 2023-05-18 ENCOUNTER — Ambulatory Visit: Admit: 2023-05-18 | Payer: Medicare PPO

## 2023-05-18 ENCOUNTER — Ambulatory Visit
Admit: 2023-05-18 | Discharge: 2023-05-18 | Disposition: A | Discharge status: Home / Self Care | Payer: Medicare PPO | Attending: Radiation Oncology | Admitting: Radiation Oncology

## 2023-05-18 DIAGNOSIS — R918 Other nonspecific abnormal finding of lung field: Secondary | ICD-10-CM | POA: Diagnosis not present

## 2023-05-18 LAB — CBC WITH DIFFERENTIAL/PLATELET
Abs Immature Granulocytes: 0.17 10*3/uL — ABNORMAL HIGH (ref 0.00–0.07)
Basophils Absolute: 0.1 10*3/uL (ref 0.0–0.1)
Basophils Relative: 0 %
Eosinophils Absolute: 0.2 10*3/uL (ref 0.0–0.5)
Eosinophils Relative: 2 %
HCT: 37.9 % (ref 36.0–46.0)
Hemoglobin: 11.3 g/dL — ABNORMAL LOW (ref 12.0–15.0)
Immature Granulocytes: 1 %
Lymphocytes Relative: 17 %
Lymphs Abs: 2 10*3/uL (ref 0.7–4.0)
MCH: 29.7 pg (ref 26.0–34.0)
MCHC: 29.8 g/dL — ABNORMAL LOW (ref 30.0–36.0)
MCV: 99.5 fL (ref 80.0–100.0)
Monocytes Absolute: 1 10*3/uL (ref 0.1–1.0)
Monocytes Relative: 9 %
Neutro Abs: 8.4 10*3/uL — ABNORMAL HIGH (ref 1.7–7.7)
Neutrophils Relative %: 71 %
Platelets: 441 10*3/uL — ABNORMAL HIGH (ref 150–400)
RBC: 3.81 MIL/uL — ABNORMAL LOW (ref 3.87–5.11)
RDW: 19.7 % — ABNORMAL HIGH (ref 11.5–15.5)
WBC: 11.9 10*3/uL — ABNORMAL HIGH (ref 4.0–10.5)
nRBC: 0 % (ref 0.0–0.2)

## 2023-05-18 LAB — RENAL FUNCTION PANEL
Albumin: 2.4 g/dL — ABNORMAL LOW (ref 3.5–5.0)
Anion gap: 10 (ref 5–15)
BUN: 29 mg/dL — ABNORMAL HIGH (ref 8–23)
CO2: 32 mmol/L (ref 22–32)
Calcium: 9.1 mg/dL (ref 8.9–10.3)
Chloride: 91 mmol/L — ABNORMAL LOW (ref 98–111)
Creatinine, Ser: 1.06 mg/dL — ABNORMAL HIGH (ref 0.44–1.00)
GFR, Estimated: 55 mL/min — ABNORMAL LOW (ref >60)
Glucose, Bld: 104 mg/dL — ABNORMAL HIGH (ref 70–99)
Phosphorus: 5.5 mg/dL — ABNORMAL HIGH (ref 2.5–4.6)
Potassium: 5.4 mmol/L — ABNORMAL HIGH (ref 3.5–5.1)
Sodium: 133 mmol/L — ABNORMAL LOW (ref 135–145)

## 2023-05-18 LAB — BASIC METABOLIC PANEL
Anion gap: 11 (ref 5–15)
BUN: 32 mg/dL — ABNORMAL HIGH (ref 8–23)
CO2: 30 mmol/L (ref 22–32)
Calcium: 9.1 mg/dL (ref 8.9–10.3)
Chloride: 94 mmol/L — ABNORMAL LOW (ref 98–111)
Creatinine, Ser: 1.13 mg/dL — ABNORMAL HIGH (ref 0.44–1.00)
GFR, Estimated: 51 mL/min — ABNORMAL LOW (ref >60)
Glucose, Bld: 120 mg/dL — ABNORMAL HIGH (ref 70–99)
Potassium: 5.1 mmol/L (ref 3.5–5.1)
Sodium: 135 mmol/L (ref 135–145)

## 2023-05-18 LAB — GLUCOSE, CAPILLARY
Glucose-Capillary: 105 mg/dL — ABNORMAL HIGH (ref 70–99)
Glucose-Capillary: 120 mg/dL — ABNORMAL HIGH (ref 70–99)
Glucose-Capillary: 116 mg/dL — ABNORMAL HIGH (ref 70–99)
Glucose-Capillary: 113 mg/dL — ABNORMAL HIGH (ref 70–99)

## 2023-05-18 NOTE — Progress Notes (Signed)
Chaplain responded to a request by Madison Ho's daughter to assist with paperwork.  Madison Ho and her daughter had questions about durable power of attorney.  Chaplain explained that she could only assist with health care poa. They were understanding and made a plan for how to work towards meeting their need.  Chaplain provided support as they discussed Madison Ho's wishes for how she would want her body cared for after she dies.  Chaplain provided spiritual support as they spoke about their own thoughts of what happens after death.    [address] [facility] Pager, [phone]

## 2023-05-18 NOTE — Progress Notes (Signed)
Speech Language Pathology Treatment: Dysphagia  Patient Details Name: Madison Ho MRN: [id number] DOB: Mar 25, 1951 Today's Date: 05/18/2023 Time: 4098-1191 SLP Time Calculation (min) (ACUTE ONLY): 24 min  Assessment / Plan / Recommendation Clinical Impression  SlP visit to treat dysphagia including educating pt/family regarding MBS results. Reviewed flouroscopy loops showing pt/family aspiration and benefit of chin tuck posture. Using teach back, pt's daughter and son verbalized understanding. SLP also observed pt with po intake but Pt only accepted a single bolus of nectar thick liquid via straw due to her severe headache. No clinical indication of aspiration observed. Pt reports displeasure with pureed diet - and SLP reviewed MBS study and will advance pt to minced foods to maximize intake with understanding that her daughter will order all of her meals and assure food is as cohesive as possible for airway protection.   Pt needed total cues to conduct chin tuck posture. Daughter educated to indication for chin tuck and benefited from moderate cues to have pt conduct adequately.  Advised nurse to findings/recommendations.     HPI HPI: 72 year old home dwelling female admitted and found to have lung mass and now brain mets. Pt observed to cough with meals. PMH: HTN HLD DM TY 2 BMI 30  Prior smoker until about 2 months ago  Came to emergency room 10/8 with left shoulder pain 40 pound weight loss failure to thrive low-grade fever temp tachycardia and hypotension responsive to IV fluid . CT scan = large left lung mass concerning for primary lung malignancy-  10/9 biopsy left upper lobe showing metastatic squamous cell CA   10/11 oncology consult 10-CT 4N2M1 SCC lung. MRI brain shows Solitary 8 mm intracranial metastasis at the anterior/inferior  aspect of the left lentiform nucleus. No significant mass effect.  CXR showed extensive left upper lobe  lung mass with veil like opacification over the left  lower lobe.  2. Signs of venous congestion within the right lung.      SLP Plan  Continue with current plan of care      Recommendations for follow up therapy are one component of a multi-disciplinary discharge planning process, led by the attending physician.  Recommendations may be updated based on patient status, additional functional criteria and insurance authorization.    Recommendations  Diet recommendations: Dysphagia 2 (fine chop);Nectar-thick liquid Medication Administration: Whole meds with puree Supervision: Full supervision/cueing for compensatory strategies Compensations: Slow rate;Small sips/bites;Chin tuck Postural Changes and/or Swallow Maneuvers: Upright 30-60 min after meal;Seated upright 90 degrees                  Oral care BID   Frequent or constant Supervision/Assistance Dysphagia, oropharyngeal phase (R13.12)     Continue with current plan of care   Rolena Infante, MS [facility] SLP Acute Rehab Services Office [phone]   Chales Abrahams  05/18/2023, 8:30 PM

## 2023-05-18 NOTE — Plan of Care (Signed)
CHL Tonsillectomy/Adenoidectomy, Postoperative PEDS care plan entered in error.

## 2023-05-18 NOTE — Progress Notes (Signed)
Physical Therapy Treatment Patient Details Name: Jolonda Rowling MRN: [id number] DOB: [birth year] Today's Date: 05/18/2023   History of Present Illness Patient is 72 y.o. female presented to the [facility] 05/07/23 w/ arm and neck pain for ~2 months with recent weight loss and FTT. Pt found to have sepsis due to UTI, metastatic squamous cell carcinoma from left upper lobe mass with additional R adrenal metastases and 8mm mass at  the anterior/inferior aspect of the left lentiform nucleus. PMH significant for DM II, diabetic neuropathy,  HTN, HLD.    PT Comments   Pt admitted with above diagnosis.  Pt currently with functional limitations due to the deficits listed below (see PT Problem List). Pt in bed when PT arrived. Pt required encouragement from therapist and family for participation. Pt required extensive assist for bed mobility tasks, min A for sitting balance Eob ~ 90s and limited due to reports of dizziness and L shoulder and abdominal pain as well as R hip pain. Pt presents with L shoulder, chest and UE edema. Daughter reports recent imaging of of L shoulder. Patient will benefit from continued inpatient follow up therapy, <3 hours/day. Pt will benefit from acute skilled PT to increase their independence and safety with mobility to allow discharge.      If plan is discharge home, recommend the following: A lot of help with bathing/dressing/bathroom;Assist for transportation;Help with stairs or ramp for entrance;Assistance with cooking/housework;Two people to help with walking and/or transfers   Can travel by private vehicle     No  Equipment Recommendations  None recommended by PT    Recommendations for Other Services       Precautions / Restrictions Precautions Precautions: Fall Precaution Comments: 3L O2 Restrictions Weight Bearing Restrictions: No     Mobility  Bed Mobility Overal bed mobility: Needs Assistance Bed Mobility: Supine to Sit, Sit to Supine, Rolling Rolling: Max  assist Sidelying to sit: Max assist, HOB elevated, Used rails Supine to sit: HOB elevated, Used rails, Max assist Sit to supine: Max assist, +2 for physical assistance, +2 for safety/equipment   General bed mobility comments: pt required extensive assist for bed mobility tasks, pt limited due to L shoulder pain and edema noted and R hip pain, cues for encouragement for participation. pt required total assist to reposition in bed    Transfers                   General transfer comment: pt reported dizziness seated EOB ~ 90s making attempts to return to bed regardless of cues for encouragement from staff and family    Ambulation/Gait               General Gait Details: NT for pt and staff safety   Stairs             Wheelchair Mobility     Tilt Bed    Modified Rankin (Stroke Patients Only)       Balance Overall balance assessment: Needs assistance Sitting-balance support: Bilateral upper extremity supported Sitting balance-Leahy Scale: Fair Sitting balance - Comments: cues for encouragement to sit EOB, trunk flexion, attention to midline pt indicating dizziness and SOB, PT coached pt on pursed lip breathing however pt continues to elect to breath through mouth limiting the benefits of supplemental o2 at 3 L/min Postural control: Right lateral lean Standing balance support:  (declined to stand due to dizzines and L side abdominal pain and L UE pain as well as R hip pain)  Cognition Arousal: Alert Behavior During Therapy: WFL for tasks assessed/performed Overall Cognitive Status: Within Functional Limits for tasks assessed                                          Exercises      General Comments General comments (skin integrity, edema, etc.): 3 L/min supplemental O2 and pt o2 saturation 87-91% with cues for pursed lip breathing and difficulty performing return demonstration      Pertinent  Vitals/Pain Pain Assessment Pain Assessment: Faces Faces Pain Scale: Hurts whole lot Pain Location: L UE/shoulder and R hip and L abdominal pain Pain Descriptors / Indicators: Sore, Constant, Discomfort    Home Living Family/patient expects to be discharged to:: Private residence Living Arrangements: Children;Other relatives Available Help at Discharge: Family;Available 24 hours/day Type of Home: House Home Access: Stairs to enter Entrance Stairs-Rails: Right Entrance Stairs-Number of Steps: 7   Home Layout: One level Home Equipment: Rollator (4 wheels);Cane - quad;Cane - single point;BSC/3in1      Prior Function            PT Goals (current goals can now be found in the care plan section) Acute Rehab PT Goals Patient Stated Goal: per family: to build stregnth for treatment PT Goal Formulation: With patient/family Time For Goal Achievement: 05/31/23 Potential to Achieve Goals: Fair Progress towards PT goals: Not progressing toward goals - comment    Frequency    Min 1X/week      PT Plan      Co-evaluation              AM-PAC PT "6 Clicks" Mobility   Outcome Measure  Help needed turning from your back to your side while in a flat bed without using bedrails?: A Lot Help needed moving from lying on your back to sitting on the side of a flat bed without using bedrails?: A Lot Help needed moving to and from a bed to a chair (including a wheelchair)?: A Lot Help needed standing up from a chair using your arms (e.g., wheelchair or bedside chair)?: Total Help needed to walk in hospital room?: Total Help needed climbing 3-5 steps with a railing? : Total 6 Click Score: 9    End of Session Equipment Utilized During Treatment: Oxygen Activity Tolerance: Patient limited by fatigue;Patient limited by pain (dizziness) Patient left: in bed;with chair alarm set;with family/visitor present;with call bell/phone within reach Nurse Communication: Mobility status PT Visit  Diagnosis: Muscle weakness (generalized) (M62.81);Unsteadiness on feet (R26.81)     Time: 7616-0737 PT Time Calculation (min) (ACUTE ONLY): 18 min  Charges:    $Therapeutic Activity: 8-22 mins PT General Charges $$ ACUTE PT VISIT: 1 Visit                     Johnny Bridge, PT Acute Rehab    Jacqualyn Posey 05/18/2023, 4:29 PM

## 2023-05-18 NOTE — Plan of Care (Signed)
  Problem: Coping: Goal: Ability to adjust to condition or change in health will improve Outcome: Not Progressing   Problem: Nutrition: Goal: Adequate nutrition will be maintained Outcome: Not Progressing   Problem: Coping: Goal: Level of anxiety will decrease Outcome: Not Progressing   Problem: Pain Managment: Goal: General experience of comfort will improve Outcome: Not Progressing

## 2023-05-18 NOTE — Progress Notes (Signed)
[facility] PROGRESS NOTE  Madison Ho (DOB: 21-Nov-1950) UUV:[id number] PCP: Ignatius Specking, MD  Brief Narrative: Madison Ho is a 72 y.o. female with a history of tobacco use, HTN, HLD, T2DM, obesity (BMI 32) who presented to the ED on 05/07/2023 with left shoulder pain, 40 pound weight loss, failure to thrive. She had low grade fever, tachycardia and hypotension responsive to IV fluid. Work up included CT which revealed a large left lung mass concerning for primary lung malignancy. Biopsy of LUL 10/9 was consistent with squamous cell lung cancer. Imaging showing local invasion, right adrenal metastases, and a solitary 8mm intracranial metastasis on MRI. Oncology was consulted, tests for Foundation One, PD-L1 testing sent. Radiation oncology was consulted and will initiate palliative XRT.   Palliative care has also been consulted as the patient's performance status may limit her treatment options and treatments would be palliative in nature.  On 10/14, the patient demonstrated aspiration. Made NPO, SLP evaluation requested. MBSS carried out 10/16.   05/18/2023: Patient seen alongside patient's daughter.  Patient's son Madison Ho consultation at some point over the phone.  Patient was not able to complete radiotherapy today due to limitations of left upper extremity movement.  Left upper extremity swelling is noted.  Otherwise, no new complaints.  Labs reviewed today (05/18/2023): Chemistry reveals sodium of 135, potassium of 5.1 (5.4), chloride of 94, CO2 of of 30, BUN of 32, serum creatinine of 1.13 and blood sugar of 120.  CBC reveals WBC of 11.9, hemoglobin of 11.3, MCV of 99.5 with platelet count of 441.  Subjective: -Patient did not complete radiotherapy due to restriction of the left upper extremity secondary to pain. -Swelling of left upper extremity noted.  Objective: Gen: Patient is obese.  Not in any obvious distress.  Left upper extremity swelling is noted.  HEENT: Patient is  pale.  No jaundice. Lungs: Clear to auscultation. CVS: S1-S2. Abdomen: Obese, soft and nontender. Neuro: Awake and alert. Extremities: No leg edema.  Assessment & Plan: Principal Problem:   Mass of upper lobe of left lung Active Problems:   Hyperlipidemia associated with type 2 diabetes mellitus (HCC)   AKI (acute kidney injury) (HCC)   Sepsis (HCC)   UTI (urinary tract infection)   Hypomagnesemia   Transient hypotension   Left shoulder pain   Compression fracture of body of thoracic vertebra (HCC)   Tobacco abuse   Debility   Mediastinal mass   Mass of left lung   DNR (do not resuscitate) discussion   Goals of care, counseling/discussion   Non-small cell lung cancer metastatic to adrenal gland (HCC)   Metastasis to brain (HCC)   Normocytic anemia   Folate deficiency   Low serum vitamin B12  Newly diagnosed LUL squamous cell lung cancer: [id number].  - Oncology consulted, signed off, and palliative care are following still. - Will follow up with oncology after discharge, Dr. Arbutus Ped.  - Radiation oncology planning 10 fractions of palliative chest radiation.  05/18/2023: Oncology team/radiation oncology team at the urgent care.  Acute on chronic hypoxic respiratory failure, COPD, tobacco use: Suspect progressive decrease in pulmonary reserve due to tumor. - Continue inhaled bronchodilators and oxygen supplementation if needed. Exam not consistent with acute COPD exacerbation at this time.  - Due to concern for aspiration, was transiently on antibiotics, discontinued 10/14. SLP evaluation with MBS 10/16 > continue dysphagia 1 diet, nectar thickened liquids.  Brain metastasis: In absence of cerebral edema, holding steroids and prophylactic AED. Has been discussed in brain oncology  conference. - Single fraction SRS considered, planning 3T MRI first.  Right adrenal metastasis: 3.2 x 2.2cm.   05/18/2023: Check cortisol level in the morning.  Deconditioning:  - Will require SNF  rehabilitation. TOC is in the process of arranging this with the family.   Left arm edema: This extremity is more immobile due to shoulder discomfort, DDx primarily DVT vs. extrinsic veinous compression from tumor.  - Venous U/S - Elevate as able - Continue heparin VTE ppx for now (has been getting)  Acute metabolic encephalopathy: Likely element of delirium, improved as of 10/16. Also improved after stopping multiple sedating medications - Delirium precautions.   Chronic left shoulder pain, chronic T9 compression fracture:  - Minimized sedating medications. Give tylenol and lidocaine patch.   T2DM: Per report. Has a HbA1c of 5.8%.  - Holding metformin during admission.    Obesity: Body mass index is 30.27 kg/m.   AKI, hyperkalemia: Improved  Presumed sepsis on admission due to UTI. Is not currently septic.   Barnetta Chapel, MD [facility] [email or web address] 05/18/2023, 12:22 PM

## 2023-05-18 NOTE — Progress Notes (Signed)
PT Cancellation Note  Patient Details Name: Madison Ho MRN: [id number] DOB: 1950/09/11   Cancelled Treatment:    Reason Eval/Treat Not Completed: Patient at procedure or test/unavailable. Pt undergoing radiation this am 1112, PT returned later in the am pt had returned to bed and nursing providing care and pt fatigued. PT to return later in day if schedule allows. PT to continue to follow acutely.   Johnny Bridge, PT Acute Rehab   Jacqualyn Posey 05/18/2023, 12:16 PM

## 2023-05-18 NOTE — TOC Progression Note (Addendum)
Transition of Care [facility]) - Progression Note    Patient Details  Name: Madison Ho MRN: [id number] Date of Birth: 03-27-1951  Transition of Care [facility]) CM/SW Contact  Beckie Busing, RN Phone Number:[phone]  05/18/2023, 2:17 PM  Clinical Narrative:    CM at bedside with patient and daughter.Daughter states that family has decided on [facility] as first choice but she has been told by [facility] that they will not be able to provide daily radiation transport. Second choice would be [place]. CM has reached out to [employer] ([place]) and [facility]) with no answers. Voicemail has been left will await return call. Barrier for placement is transport to daily radiation. Family states that they are unable to transport for treatments.   1525 CM heard from Shelby at [place]. [facility] can not provide transportation. Patients insurance also will not cover radiation and SNF. This most likely will be a barrier for any SNF placement. CM will forrow up with [facility].    Expected Discharge Plan: Skilled Nursing Facility Barriers to Discharge: Continued Medical Work up  Expected Discharge Plan and Services In-house Referral: Clinical Social Work Discharge Planning Services: CM Consult Post Acute Care Choice: Home Health Living arrangements for the past 2 months: Single Family Home                 DME Arranged: N/A         HH Arranged: PT HH Agency: [facility] Date HH Agency Contacted: 05/11/23 Time HH Agency Contacted: 1145 Representative spoke with at [facility] Agency: Tresa Endo   Social Determinants of Health (SDOH) Interventions SDOH Screenings   Food Insecurity: No Food Insecurity (05/08/2023)  Housing: Low Risk  (05/08/2023)  Transportation Needs: No Transportation Needs (05/08/2023)  Utilities: Not At Risk (05/08/2023)  Alcohol Screen: Low Risk  (11/10/2020)  Depression (PHQ2-9): Low Risk  (11/10/2020)  Financial Resource Strain: Low Risk  (11/10/2020)   Physical Activity: Inactive (11/10/2020)  Social Connections: Moderately Isolated (11/10/2020)  Stress: No Stress Concern Present (11/10/2020)  Tobacco Use: High Risk (05/07/2023)    Readmission Risk Interventions     No data to display

## 2023-05-19 ENCOUNTER — Ambulatory Visit (HOSPITAL_COMMUNITY): Payer: Medicare PPO

## 2023-05-19 DIAGNOSIS — G928 Other toxic encephalopathy: Secondary | ICD-10-CM | POA: Diagnosis present

## 2023-05-19 DIAGNOSIS — C3492 Malignant neoplasm of unspecified part of left bronchus or lung: Secondary | ICD-10-CM | POA: Diagnosis present

## 2023-05-19 DIAGNOSIS — M25512 Pain in left shoulder: Secondary | ICD-10-CM | POA: Diagnosis not present

## 2023-05-19 DIAGNOSIS — F17210 Nicotine dependence, cigarettes, uncomplicated: Secondary | ICD-10-CM

## 2023-05-19 DIAGNOSIS — Z7189 Other specified counseling: Secondary | ICD-10-CM | POA: Diagnosis not present

## 2023-05-19 DIAGNOSIS — I82A12 Acute embolism and thrombosis of left axillary vein: Secondary | ICD-10-CM | POA: Diagnosis not present

## 2023-05-19 DIAGNOSIS — N390 Urinary tract infection, site not specified: Secondary | ICD-10-CM

## 2023-05-19 DIAGNOSIS — R1310 Dysphagia, unspecified: Secondary | ICD-10-CM

## 2023-05-19 DIAGNOSIS — N179 Acute kidney failure, unspecified: Secondary | ICD-10-CM | POA: Diagnosis present

## 2023-05-19 DIAGNOSIS — R5383 Other fatigue: Secondary | ICD-10-CM

## 2023-05-19 DIAGNOSIS — E1122 Type 2 diabetes mellitus with diabetic chronic kidney disease: Secondary | ICD-10-CM | POA: Diagnosis present

## 2023-05-19 DIAGNOSIS — N1831 Chronic kidney disease, stage 3a: Secondary | ICD-10-CM | POA: Diagnosis present

## 2023-05-19 DIAGNOSIS — R222 Localized swelling, mass and lump, trunk: Secondary | ICD-10-CM

## 2023-05-19 DIAGNOSIS — C7970 Secondary malignant neoplasm of unspecified adrenal gland: Secondary | ICD-10-CM

## 2023-05-19 DIAGNOSIS — L8990 Pressure ulcer of unspecified site, unspecified stage: Secondary | ICD-10-CM | POA: Insufficient documentation

## 2023-05-19 DIAGNOSIS — R918 Other nonspecific abnormal finding of lung field: Secondary | ICD-10-CM | POA: Diagnosis not present

## 2023-05-19 DIAGNOSIS — Z515 Encounter for palliative care: Secondary | ICD-10-CM | POA: Diagnosis not present

## 2023-05-19 LAB — CBC WITH DIFFERENTIAL/PLATELET
Abs Immature Granulocytes: 0.12 10*3/uL — ABNORMAL HIGH (ref 0.00–0.07)
Basophils Absolute: 0.1 10*3/uL (ref 0.0–0.1)
Basophils Relative: 1 %
Eosinophils Absolute: 0.1 10*3/uL (ref 0.0–0.5)
Eosinophils Relative: 1 %
HCT: 38 % (ref 36.0–46.0)
Hemoglobin: 11.3 g/dL — ABNORMAL LOW (ref 12.0–15.0)
Immature Granulocytes: 1 %
Lymphocytes Relative: 13 %
Lymphs Abs: 1.5 10*3/uL (ref 0.7–4.0)
MCH: 29.8 pg (ref 26.0–34.0)
MCHC: 29.7 g/dL — ABNORMAL LOW (ref 30.0–36.0)
MCV: 100.3 fL — ABNORMAL HIGH (ref 80.0–100.0)
Monocytes Absolute: 0.8 10*3/uL (ref 0.1–1.0)
Monocytes Relative: 7 %
Neutro Abs: 8.9 10*3/uL — ABNORMAL HIGH (ref 1.7–7.7)
Neutrophils Relative %: 77 %
Platelets: 397 10*3/uL (ref 150–400)
RBC: 3.79 MIL/uL — ABNORMAL LOW (ref 3.87–5.11)
RDW: 19.8 % — ABNORMAL HIGH (ref 11.5–15.5)
WBC: 11.4 10*3/uL — ABNORMAL HIGH (ref 4.0–10.5)
nRBC: 0 % (ref 0.0–0.2)

## 2023-05-19 LAB — COMPREHENSIVE METABOLIC PANEL
ALT: 14 U/L (ref 0–44)
AST: 22 U/L (ref 15–41)
Albumin: 2.3 g/dL — ABNORMAL LOW (ref 3.5–5.0)
Alkaline Phosphatase: 52 U/L (ref 38–126)
Anion gap: 11 (ref 5–15)
BUN: 37 mg/dL — ABNORMAL HIGH (ref 8–23)
CO2: 30 mmol/L (ref 22–32)
Calcium: 9 mg/dL (ref 8.9–10.3)
Chloride: 91 mmol/L — ABNORMAL LOW (ref 98–111)
Creatinine, Ser: 1.07 mg/dL — ABNORMAL HIGH (ref 0.44–1.00)
GFR, Estimated: 55 mL/min — ABNORMAL LOW (ref >60)
Glucose, Bld: 106 mg/dL — ABNORMAL HIGH (ref 70–99)
Potassium: 5.5 mmol/L — ABNORMAL HIGH (ref 3.5–5.1)
Sodium: 132 mmol/L — ABNORMAL LOW (ref 135–145)
Total Bilirubin: 0.4 mg/dL (ref 0.3–1.2)
Total Protein: 6.2 g/dL — ABNORMAL LOW (ref 6.5–8.1)

## 2023-05-19 LAB — GLUCOSE, CAPILLARY
Glucose-Capillary: 100 mg/dL — ABNORMAL HIGH (ref 70–99)
Glucose-Capillary: 118 mg/dL — ABNORMAL HIGH (ref 70–99)
Glucose-Capillary: 148 mg/dL — ABNORMAL HIGH (ref 70–99)
Glucose-Capillary: 77 mg/dL (ref 70–99)
Glucose-Capillary: 86 mg/dL (ref 70–99)

## 2023-05-19 LAB — PROTIME-INR
INR: 1.1 (ref 0.8–1.2)
Prothrombin Time: 14.6 s (ref 11.4–15.2)

## 2023-05-19 LAB — CORTISOL: Cortisol, Plasma: 10.9 ug/dL

## 2023-05-19 LAB — APTT: aPTT: 37 s — ABNORMAL HIGH (ref 24–36)

## 2023-05-19 MED ORDER — SODIUM ZIRCONIUM CYCLOSILICATE 10 G PO PACK
10.0000 g | PACK | Freq: Once | ORAL | Status: AC
  Administered 2023-05-19: 10 g via ORAL
  Filled 2023-05-19: qty 1

## 2023-05-19 MED ORDER — INSULIN ASPART 100 UNIT/ML IJ SOLN: 0.0000 [IU] | Freq: Three times a day (TID) | INTRAMUSCULAR | Status: DC

## 2023-05-19 NOTE — Hospital Course (Addendum)
72 year old female with past medical history of hypertension, hyperlipidemia, diabetes mellitus type 2, COPD and neuropathy who presented to [facility] emergency department with left arm and shoulder pain 40 pound weight loss and failure to thrive.  Patient was admitted to hospital service for workup.  Workup included CT imaging which revealed a large left lung mass concerning for primary lung malignancy.  Biopsy of left upper lobe mass on 10/9 was consistent with squamous cell lung cancer showing local invasion, right adrenal metastases and a solitary 8 mm intracranial metastasis on MRI.  Dr. Cherly Hensen with Medical Oncology was consulted as well as radiation oncology.  Patient was transferred to [facility] for initiation of palliative XRT, 10 fractions of palliative chest radiation.  Palliative care is also been consulted for their assistance.    Hospital course has had multiple complications including an episode of observed aspiration on 10/14.  Patient was briefly made n.p.o. and speech therapy evaluation was carried out followed by modified barium swallow on 10/16.  An appropriate dysphagia 2 diet was then ordered.  Additionally patient developed a left upper extremity DVT identified on ultrasound on 10/17 for which Lovenox was initiated.  Considering associated debility and weakness patient is anticipated to go to a skilled nursing facility upon discharge.

## 2023-05-19 NOTE — Assessment & Plan Note (Signed)
Creatinine currently near baseline

## 2023-05-19 NOTE — Assessment & Plan Note (Addendum)
Poor prognosis due to extensive progression of left lung squamous cell cancer Palliative care has been assisting in having goals of care discussions with family At this time patient is full code and is pursuing all available modalities of care.

## 2023-05-19 NOTE — Progress Notes (Signed)
Daily Progress Note   Patient Name: Madison Ho       Date: 05/19/2023 DOB: [birth year]  Age: 72 y.o. MRN#: [id number] Attending Physician: Marinda Elk, MD Primary Care Physician: Ignatius Specking, MD Admit Date: 05/07/2023 Length of Stay: 11 days  Reason for Consultation/Follow-up: Establishing goals of care  HPI/Patient Profile:  72 y.o. female  with past medical history of hypertension, hyperlipidemia, diabetes, neuropathy, obesity admitted on 05/07/2023 with significant weight loss, weakness, left shoulder pain and left chest swelling. Diagnosed with potential UTI and aspiration pneumonia but also with metastatic lung care to right adrenal, brain. Tentative plans for SRS radiation to brain but with ongoing decline will reassess goals of care before proceeding with treatment.   Subjective:   Subjective: Chart Reviewed. Updates received. Patient Assessed. Created space and opportunity for patient  and family to explore thoughts and feelings regarding current medical situation.  Today's Discussion: Today I saw the patient at the bedside, no family was present.  The patient was sleeping but easily awakened to voice.  She states she did not have radiation treatment yesterday because of her arm edema.  They are planning some sort of test today (later found to be a 3T MRI to plan for brain radiation).  She denies any nausea or vomiting today.  Overall she states she is doing okay.  At this point the plan is to continue full scope of care in an attempt to find care.  Continue to encourage discussion amongst family about DNR status and other options should there be a clinical decline.  I told the patient that I would check back in on Tuesday and somebody from our team would follow her chart for any significant changes.  Referral has been made to the outpatient palliative medicine clinic in the cancer center to follow-up with the patient if she continues with treatment.  I provided emotional  and general support through therapeutic listening, empathy, sharing of stories, and other techniques. I answered all questions and addressed all concerns to the best of my ability.  Review of Systems  Constitutional:  Positive for fatigue.  Respiratory:  Negative for cough and shortness of breath.   Gastrointestinal:  Negative for abdominal pain, nausea and vomiting.  Neurological:  Positive for weakness.    Objective:   Vital Signs:  BP 108/64 (BP Location: Right Arm)   Pulse (!) 103   Temp 97.7 F (36.5 C) (Oral)   Resp 18   Ht 5\' 5"  (1.651 m)   Wt 82.5 kg   SpO2 93%   BMI 30.27 kg/m   Physical Exam: Physical Exam Vitals and nursing note reviewed.  Constitutional:      General: She is sleeping. She is not in acute distress.    Appearance: She is ill-appearing.  HENT:     Head: Normocephalic and atraumatic.  Cardiovascular:     Rate and Rhythm: Normal rate.  Pulmonary:     Effort: Pulmonary effort is normal. No respiratory distress.     Breath sounds: No wheezing or rhonchi.  Abdominal:     General: Bowel sounds are normal. There is no distension.     Palpations: Abdomen is soft.  Skin:    General: Skin is warm and dry.  Neurological:     General: No focal deficit present.     Mental Status: She is easily aroused.  Psychiatric:        Mood and Affect: Mood normal.        Behavior: Behavior  normal.     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  Known cancer  not curable but treatable.  Pending foundation 1 testing.  Options could include targeted immunotherapy, depending on how she does moving forward.  Plans to start radiation therapy today with 12 treatments to the lung and 1 treatment to the brain, was unable to start yesterday due to arm edema.  Plan for outpatient oncology follow-up.  If she does not do well or has a clinical decline in the coming days would need to rediscuss CODE STATUS and comfort care; the patient's daughter seems realistic, her son is struggling a bit more at this.  In the meantime we will take it a day at a time and refer her to outpatient palliative medicine in the cancer center.  Long-term prognosis poor.  SUMMARY OF RECOMMENDATIONS   Remain full code for now Continue full scope of care Plan for radiation treatments Time for outcomes Further GOC discussions if the patient has declined in the coming days Outpatient palliative care referral to the cancer center clinic Palliative medicine will follow-up in a couple days to see how patient is doing  Symptom Management:  Ultram 50 mg p.o. every 6 hours as needed Tylenol 1000 mg p.o. around-the-clock every 8 hours Gabapentin 100 mg twice daily Lidocaine 5% patch transdermal over 12 hours every 24 hours Further adjustments will be made as needed  Code Status: Full code  Prognosis: Unable to determine  Discharge Planning: To Be Determined  Discussed with: Patient, family, medical team, nursing team  Thank you for allowing [place] to participate in the care of Latedra Steerman PMT will continue to support holistically.  Time Total: 40 min  Detailed review of medical records (labs, imaging, vital signs), medically appropriate exam, discussed with treatment team, counseling and education to patient, family, & staff, documenting clinical information, medication management, coordination of care  Wynne Dust, NP Palliative Medicine Team  Team Phone # [phone]  (Nights/Weekends)  03/29/2021, 8:17 AM

## 2023-05-19 NOTE — Assessment & Plan Note (Signed)
?   Counseling on cessation daily ?

## 2023-05-19 NOTE — Assessment & Plan Note (Signed)
Identification of large left lung mass upon this admission determined to be squamous cell lung cancer Masses substantial in size measuring 13 x 15 cm x 17 cm with encasement of the left main pulmonary artery with mass effect as well as abutting the distal trachea, encasing the left mainstem bronchus, occluding the left upper lobe bronchus and abutting the thoracic aorta. As a result patient is suffering from substantial left upper extremity pain and edema with concurrent development of DVT. Currently undergoing radiation therapy.  Patient has received 1 of 10 cycles thus far. Continuing analgesics including Tylenol, Duragesic passages and Ultram. After discussion with patient/family, they are agreeable for as needed low-dose morphine for severe pain

## 2023-05-19 NOTE — Assessment & Plan Note (Signed)
Resolved, antibiotics complete

## 2023-05-19 NOTE — Assessment & Plan Note (Signed)
Patient experienced substantial lethargy and confusion on 10/14 with observed aspiration Mentation seems to be improving although patient did receive a speech therapy evaluation and it was recommended the patient continue a dysphagia diet for now.

## 2023-05-19 NOTE — Assessment & Plan Note (Signed)
Pain is felt to be completely related to ongoing lung malignancy  Patient is currently on Lidoderm patches, Tylenol and Ultram. Palliative care assisting with adjusting medications.

## 2023-05-19 NOTE — Progress Notes (Signed)
PROGRESS NOTE   Madison Ho  YNW:[id number] DOB: [birth year] DOA: 05/07/2023 PCP: Ignatius Specking, MD   Date of Service: the patient was seen and examined on 05/19/2023  Brief Narrative:  72 year old female with past medical history of hypertension, hyperlipidemia, diabetes mellitus type 2, COPD and neuropathy who presented to [facility] emergency department with left arm and shoulder pain 40 pound weight loss and failure to thrive.  Patient was admitted to hospital service for workup.  Workup included CT imaging which revealed a large left lung mass concerning for primary lung malignancy.  Biopsy of left upper lobe mass on 10/9 was consistent with squamous cell lung cancer showing local invasion, right adrenal metastases and a solitary 8 mm intracranial metastasis on MRI.  Dr. Cherly Hensen with Medical Oncology was consulted as well as radiation oncology.  Patient was transferred to [facility] for initiation of palliative XRT, 10 fractions of palliative chest radiation.  Palliative care is also been consulted for their assistance.    Hospital course has had multiple complications including an episode of observed aspiration on 10/14.  Patient was briefly made n.p.o. and speech therapy evaluation was carried out followed by modified barium swallow on 10/16.  An appropriate dysphagia 2 diet was then ordered.  Additionally patient developed a left upper extremity DVT identified on ultrasound on 10/17 for which Lovenox was initiated.  Considering associated debility and weakness patient is anticipated to go to a skilled nursing facility upon discharge.     Assessment and Plan: Squamous cell lung cancer, left (HCC) Identification of large left lung mass upon this admission determined to be squamous cell lung cancer Masses substantial in size measuring 13 x 15 cm x 17 cm with encasement of the left main pulmonary artery with mass effect as well as abutting the distal trachea, encasing  the left mainstem bronchus, occluding the left upper lobe bronchus and abutting the thoracic aorta. As a result patient is suffering from substantial left upper extremity pain and edema with concurrent development of DVT. Currently undergoing radiation therapy.  Patient has received 1 of 10 cycles thus far. Continuing analgesics including Tylenol, Duragesic passages and Ultram.  Acute deep vein thrombosis (DVT) of axillary vein of left upper extremity (HCC) Patient is on Lovenox twice daily As needed analgesics for associated substantial pain.  Left shoulder pain Pain is felt to be completely related to ongoing lung malignancy  Patient is currently on Lidoderm patches, Tylenol and Ultram. Palliative care assisting with adjusting medications.  Dysphagia Patient experienced substantial lethargy and confusion on 10/14 with observed aspiration Mentation seems to be improving although patient did receive a speech therapy evaluation and it was recommended the patient continue a dysphagia diet for now.  Toxic metabolic encephalopathy Substantial lethargy noted on 10/14, likely secondary to polypharmacy including repeated doses of oxycodone. Mentation improved. Sparing use of analgesics Unlikely the patient's brain metastasis contributing at this time.   Acute renal failure superimposed on stage 3a chronic kidney disease (HCC) Creatinine currently near baseline  Type 2 diabetes mellitus with stage 3a chronic kidney disease, without long-term current use of insulin (HCC) Accu-Cheks before every meal and nightly with sliding scale insulin.  Sepsis secondary to UTI [facility]) Resolved, antibiotics complete.  Goals of care, counseling/discussion Poor prognosis due to extensive progression of left lung squamous cell cancer Palliative care has been assisting in having goals of care discussions with family At this time patient is full code and is pursuing all available modalities of  care.  Nicotine dependence, cigarettes, uncomplicated Counseling on cessation daily.    Subjective:  Patient is complaining of severe left arm pain, sharp in quality, radiating distally, worse with movement.  Patient only experiences some improvement with as needed Ultram.  Physical Exam:  Vitals:   05/19/23 0452 05/19/23 0824 05/19/23 1351 05/19/23 2140  BP:   109/72 115/68  Pulse: (!) 103  (!) 106 (!) 102  Resp:   17 16  Temp:   97.6 F (36.4 C) 98.2 F (36.8 C)  TempSrc:   Axillary Oral  SpO2: 92% 93% 91% 93%  Weight:      Height:        Constitutional: Awake alert and oriented x3, patient is in distress due to pain. Skin: no rashes, no lesions, good skin turgor noted. Eyes: Pupils are equally reactive to light.  No evidence of scleral icterus or conjunctival pallor.  ENMT: Moist mucous membranes noted.  Posterior pharynx clear of any exudate or lesions.   Respiratory: clear to auscultation bilaterally, no wheezing, no crackles. Normal respiratory effort. No accessory muscle use.  Cardiovascular: Regular rate and rhythm, no murmurs / rubs / gallops.  Notable significant edema of the left upper extremity.  2+ pedal pulses. No carotid bruits.  Abdomen: Abdomen is soft and nontender.  No evidence of intra-abdominal masses.  Positive bowel sounds noted in all quadrants.   Musculoskeletal: Severe pain with both passive and active range of motion of the left upper extremity at the shoulder region.  This is associated with severe edema of the affected extremity.  No contractures. Normal muscle tone.    Data Reviewed:  I have personally reviewed and interpreted labs, imaging.  Significant findings are   CBC: Recent Labs  Lab 05/14/23 0348 05/16/23 0537 05/17/23 0538 05/18/23 0556 05/19/23 1416  WBC 11.4* 10.0 11.4* 11.9* 11.4*  NEUTROABS 8.4* 6.4 8.2* 8.4* 8.9*  HGB 10.3* 10.6* 10.9* 11.3* 11.3*  HCT 33.7* 34.5* 36.2 37.9 38.0  MCV 96.8 98.3 98.6 99.5 100.3*  PLT  432* 409* 412* 441* 397   Basic Metabolic Panel: Recent Labs  Lab 05/16/23 0537 05/17/23 0538 05/18/23 0556 05/18/23 1204 05/19/23 1416  NA 139 135 133* 135 132*  K 4.5 4.4 5.4* 5.1 5.5*  CL 95* 93* 91* 94* 91*  CO2 30 30 32 30 30  GLUCOSE 100* 120* 104* 120* 106*  BUN 20 24* 29* 32* 37*  CREATININE 1.03* 0.88 1.06* 1.13* 1.07*  CALCIUM 9.5 9.0 9.1 9.1 9.0  PHOS 4.8* 4.5 5.5*  --   --    GFR: Estimated Creatinine Clearance: 49.7 mL/min (A) (by C-G formula based on SCr of 1.07 mg/dL (H)). Liver Function Tests: Recent Labs  Lab 05/16/23 0537 05/17/23 0538 05/18/23 0556 05/19/23 1416  AST  --   --   --  22  ALT  --   --   --  14  ALKPHOS  --   --   --  52  BILITOT  --   --   --  0.4  PROT  --   --   --  6.2*  ALBUMIN 2.4* 2.2* 2.4* 2.3*    Coagulation Profile: Recent Labs  Lab 05/19/23 1416  INR 1.1     Code Status:  Full code.  Code status decision has been confirmed with: patient    Severity of Illness:  The appropriate patient status for this patient is INPATIENT. Inpatient status is judged to be reasonable and necessary in order to provide the required  intensity of service to ensure the patient's safety. The patient's presenting symptoms, physical exam findings, and initial radiographic and laboratory data in the context of their chronic comorbidities is felt to place them at high risk for further clinical deterioration. Furthermore, it is not anticipated that the patient will be medically stable for discharge from the hospital within 2 midnights of admission.   * I certify that at the point of admission it is my clinical judgment that the patient will require inpatient hospital care spanning beyond 2 midnights from the point of admission due to high intensity of service, high risk for further deterioration and high frequency of surveillance required.*  Time spent:  56 minutes  Author:  Marinda Elk MD  05/19/2023 11:15 PM

## 2023-05-19 NOTE — Assessment & Plan Note (Signed)
Good glycemic control Accu-Cheks before every meal and nightly with sliding scale insulin.

## 2023-05-19 NOTE — Assessment & Plan Note (Signed)
Patient is on Lovenox twice daily As needed analgesics for associated substantial pain.

## 2023-05-19 NOTE — Assessment & Plan Note (Signed)
Substantial lethargy noted on 10/14, likely secondary to polypharmacy including repeated doses of oxycodone. Mentation improved. Sparing use of analgesics Unlikely the patient's brain metastasis contributing at this time.

## 2023-05-20 DIAGNOSIS — E538 Deficiency of other specified B group vitamins: Secondary | ICD-10-CM

## 2023-05-20 DIAGNOSIS — I82A12 Acute embolism and thrombosis of left axillary vein: Secondary | ICD-10-CM | POA: Diagnosis not present

## 2023-05-20 DIAGNOSIS — R1310 Dysphagia, unspecified: Secondary | ICD-10-CM | POA: Diagnosis not present

## 2023-05-20 DIAGNOSIS — M25512 Pain in left shoulder: Secondary | ICD-10-CM | POA: Diagnosis not present

## 2023-05-20 DIAGNOSIS — C3492 Malignant neoplasm of unspecified part of left bronchus or lung: Secondary | ICD-10-CM | POA: Diagnosis not present

## 2023-05-20 LAB — GLUCOSE, CAPILLARY
Glucose-Capillary: 92 mg/dL (ref 70–99)
Glucose-Capillary: 80 mg/dL (ref 70–99)
Glucose-Capillary: 97 mg/dL (ref 70–99)
Glucose-Capillary: 100 mg/dL — ABNORMAL HIGH (ref 70–99)

## 2023-05-20 MED ORDER — MORPHINE SULFATE (PF) 2 MG/ML IV SOLN
1.0000 mg | INTRAVENOUS | Status: DC | PRN
  Administered 2023-05-21 – 2023-05-25 (×16): 1 mg via INTRAVENOUS
  Filled 2023-05-20 (×17): qty 1

## 2023-05-20 NOTE — Progress Notes (Signed)
Occupational Therapy Treatment Patient Details Name: Madison Ho MRN: [id number] DOB: [birth year] Today's Date: 05/20/2023   History of present illness Patient is 72 y.o. female presented to the [facility] 05/07/23 w/ arm and neck pain for ~2 months with recent weight loss and FTT. Pt found to have sepsis due to UTI, metastatic squamous cell carcinoma from left upper lobe mass with additional R adrenal metastases and 8mm mass at  the anterior/inferior aspect of the left lentiform nucleus. PMH significant for DM II, diabetic neuropathy,  HTN, HLD.   OT comments  Pt willing to attempt sitting EOB, but once there c/o dizziness and feeling as if she were going to faint. Returned to sidelying with resolving of symptoms. Pt requiring min to mod assist for bed mobility. Pt completed bed level grooming. Total assist for socks. Elevated L UE, unable to find a position of comfort for L arm, RN notified that pt requests pain meds.       If plan is discharge home, recommend the following:  A lot of help with bathing/dressing/bathroom;Assistance with cooking/housework;Direct supervision/assist for medications management;Direct supervision/assist for financial management;Assist for transportation;Help with stairs or ramp for entrance;Two people to help with walking and/or transfers   Equipment Recommendations  Other (comment) (defer to next venue)    Recommendations for Other Services      Precautions / Restrictions Precautions Precautions: Fall Precaution Comments: 3L O2 Restrictions Weight Bearing Restrictions: No       Mobility Bed Mobility Overal bed mobility: Needs Assistance Bed Mobility: Rolling, Sidelying to Sit, Sit to Sidelying Rolling: Min assist Sidelying to sit: Mod assist     Sit to sidelying: Mod assist General bed mobility comments: heavy reliance on rail, increased time, assist to initiate rolling, mod assist for LEs over EOB, min assist to raise trunk, mod assist for LEs back  into bed, positioned pt on her R side at end of session with L UE supported/elevated on pillows    Transfers                   General transfer comment: pt reported dizziness seated EOB stating "I feel like I am going to faint." Returned to sidelying with resolving of symptoms, pt declined bed in chair position     Balance Overall balance assessment: Needs assistance   Sitting balance-Leahy Scale: Fair                                     ADL either performed or assessed with clinical judgement   ADL       Grooming: Oral care;Bed level;Set up               Lower Body Dressing: Total assistance;Bed level                      Extremity/Trunk Assessment              Vision       Perception     Praxis      Cognition Arousal: Alert Behavior During Therapy: Flat affect Overall Cognitive Status: Impaired/Different from baseline Area of Impairment: Problem solving                             Problem Solving: Slow processing, Decreased initiation, Difficulty sequencing, Requires verbal cues  Exercises      Shoulder Instructions       General Comments      Pertinent Vitals/ Pain       Pain Assessment Pain Assessment: Faces Faces Pain Scale: Hurts even more Pain Location: L arm Pain Descriptors / Indicators: Grimacing, Guarding, Discomfort Pain Intervention(s): Repositioned, Patient requesting pain meds-RN notified  Home Living                                          Prior Functioning/Environment              Frequency  Min 1X/week        Progress Toward Goals  OT Goals(current goals can now be found in the care plan section)  Progress towards OT goals: Not progressing toward goals - comment  Acute Rehab OT Goals OT Goal Formulation: With patient/family Time For Goal Achievement: 05/23/23 Potential to Achieve Goals: Fair  Plan      Co-evaluation                  AM-PAC OT "6 Clicks" Daily Activity     Outcome Measure   Help from another person eating meals?: A Little Help from another person taking care of personal grooming?: A Little Help from another person toileting, which includes using toliet, bedpan, or urinal?: A Lot Help from another person bathing (including washing, rinsing, drying)?: A Lot Help from another person to put on and taking off regular upper body clothing?: A Little Help from another person to put on and taking off regular lower body clothing?: A Lot 6 Click Score: 15    End of Session Equipment Utilized During Treatment: Oxygen (3L)  OT Visit Diagnosis: Muscle weakness (generalized) (M62.81);Other symptoms and signs involving cognitive function;Pain Pain - Right/Left: Left Pain - part of body: Arm   Activity Tolerance Patient limited by fatigue (dizziness EOB)   Patient Left in bed;with call bell/phone within reach;with bed alarm set;with family/visitor present   Nurse Communication Patient requests pain meds        Time: 9147-8295 OT Time Calculation (min): 26 min  Charges: OT General Charges $OT Visit: 1 Visit OT Treatments $Self Care/Home Management : 8-22 mins $Therapeutic Activity: 8-22 mins  Berna Spare, OTR/L Acute Rehabilitation Services Office: [phone]   Evern Bio 05/20/2023, 12:41 PM

## 2023-05-20 NOTE — Progress Notes (Signed)
PHARMACY - ANTICOAGULATION CONSULT NOTE  Pharmacy Consult for Lovenox Indication: DVT  Allergies  Allergen Reactions   Penicillins Swelling    Has taken cipro with no problems    Patient Measurements: Height: 5\' 5"  (165.1 cm) Weight: 87.6 kg (193 lb 2 oz) IBW/kg (Calculated) : 57 Heparin Dosing Weight: 75.2 kg  Vital Signs: Temp: 97.8 F (36.6 C) (10/20 0501) Temp Source: Oral (10/20 0501) BP: 96/65 (10/20 0501) Pulse Rate: 99 (10/20 0501)  Labs: Recent Labs    05/18/23 0556 05/18/23 1204 05/19/23 1416  HGB 11.3*  --  11.3*  HCT 37.9  --  38.0  PLT 441*  --  397  APTT  --   --  37*  LABPROT  --   --  14.6  INR  --   --  1.1  CREATININE 1.06* 1.13* 1.07*    Estimated Creatinine Clearance: 51.2 mL/min (A) (by C-G formula based on SCr of 1.07 mg/dL (H)).   Assessment: 72 YO female who presented to the ED 10/7 with left shoulder pain, found to have squamous cell lung cancer. Doppler positive for axillary DVT. Not on AC PTA. Patient has been on heparin [place] TID for DVT prophylaxis while inpatient--last dose 10/17 @1322 . Pharmacy consulted for Lovenox dosing.   Today, 05/20/23 Labs on 10/19: CBC stable, SCr 1.07 No s/sx of bleeding reported   Goal of Therapy:  Heparin level 0.3-0.7 units/ml Monitor platelets by anticoagulation protocol: Yes   Plan:  continue Lovenox 1mg /kg q12hrs  Monitor for s/sx of bleeding daily F/u long term anticoagulation plan - ? Transition to DOAC vs long term Lovenox  Herby Abraham, Pharm.D Use secure chat for questions 05/20/2023 12:11 PM

## 2023-05-21 ENCOUNTER — Ambulatory Visit (HOSPITAL_COMMUNITY)
Admit: 2023-05-21 | Discharge: 2023-05-21 | Disposition: A | Discharge status: Home / Self Care | Payer: Medicare PPO | Attending: Radiation Oncology | Admitting: Radiation Oncology

## 2023-05-21 ENCOUNTER — Ambulatory Visit: Admit: 2023-05-21 | Payer: Medicare PPO

## 2023-05-21 DIAGNOSIS — E1122 Type 2 diabetes mellitus with diabetic chronic kidney disease: Secondary | ICD-10-CM | POA: Diagnosis not present

## 2023-05-21 DIAGNOSIS — C3492 Malignant neoplasm of unspecified part of left bronchus or lung: Secondary | ICD-10-CM | POA: Diagnosis not present

## 2023-05-21 DIAGNOSIS — M25512 Pain in left shoulder: Secondary | ICD-10-CM | POA: Diagnosis not present

## 2023-05-21 DIAGNOSIS — I82A12 Acute embolism and thrombosis of left axillary vein: Secondary | ICD-10-CM | POA: Diagnosis not present

## 2023-05-21 LAB — GLUCOSE, CAPILLARY
Glucose-Capillary: 86 mg/dL (ref 70–99)
Glucose-Capillary: 98 mg/dL (ref 70–99)
Glucose-Capillary: 99 mg/dL (ref 70–99)
Glucose-Capillary: 152 mg/dL — ABNORMAL HIGH (ref 70–99)

## 2023-05-21 MED ORDER — MORPHINE SULFATE (PF) 2 MG/ML IV SOLN
2.0000 mg | Freq: Once | INTRAVENOUS | Status: AC
  Administered 2023-05-23: 2 mg via INTRAVENOUS
  Filled 2023-05-21: qty 1

## 2023-05-21 MED ORDER — MORPHINE SULFATE (PF) 2 MG/ML IV SOLN
INTRAVENOUS | Status: AC
  Filled 2023-05-21: qty 1

## 2023-05-21 NOTE — TOC Progression Note (Signed)
Transition of Care [facility]) - Progression Note    Patient Details  Name: Madison Ho MRN: [id number] Date of Birth: 25-Nov-1950  Transition of Care [facility]) CM/SW Contact  Beckie Busing, RN Phone Number:[phone]  05/21/2023, 2:24 PM  Clinical Narrative:    CM received call from [place] . Facility is unable to offer while on radiation because the cost for transport will be to expensive. TOC will continue to search but transportation to daily radiation will most likely be a barrier at any facility. Family at bedside continues to states that family is not able to assist with any transportation.    Expected Discharge Plan: Skilled Nursing Facility Barriers to Discharge: Continued Medical Work up  Expected Discharge Plan and Services In-house Referral: Clinical Social Work Discharge Planning Services: CM Consult Post Acute Care Choice: Home Health Living arrangements for the past 2 months: Single Family Home                 DME Arranged: N/A         HH Arranged: PT HH Agency: [facility] Date HH Agency Contacted: 05/11/23 Time HH Agency Contacted: 1145 Representative spoke with at [facility] Agency: Tresa Endo   Social Determinants of Health (SDOH) Interventions SDOH Screenings   Food Insecurity: No Food Insecurity (05/08/2023)  Housing: Low Risk  (05/08/2023)  Transportation Needs: No Transportation Needs (05/08/2023)  Utilities: Not At Risk (05/08/2023)  Alcohol Screen: Low Risk  (11/10/2020)  Depression (PHQ2-9): Low Risk  (11/10/2020)  Financial Resource Strain: Low Risk  (11/10/2020)  Physical Activity: Inactive (11/10/2020)  Social Connections: Moderately Isolated (11/10/2020)  Stress: No Stress Concern Present (11/10/2020)  Tobacco Use: High Risk (05/07/2023)    Readmission Risk Interventions     No data to display

## 2023-05-21 NOTE — Progress Notes (Signed)
The proposed treatment discussed in conference is for discussion purpose only and is not a binding recommendation.  The patients have not been physically examined, or presented with their treatment options.  Therefore, final treatment plans cannot be decided.  

## 2023-05-21 NOTE — Assessment & Plan Note (Signed)
Daily folic acid supplementation.

## 2023-05-21 NOTE — Progress Notes (Signed)
This RN was called over to MRI to administer medication to an inpatient from [facility] who was having an MRI brain study. At the bedside patient was hooked up to nasal cannula 4 liters and breathing shallow. Patient was notably lethargic however arousable. Hooked up to cardiac monitor and patient was 87% on 4 liters and this RN increased to 6 liters. Vitals obtained. HR 101 ST, RR 20, BP 115/61 and 87% 4 liters [place]. This RN withheld 2 mg morphine at this time and wasted in sharps with Marthe Patch.

## 2023-05-21 NOTE — Progress Notes (Signed)
PROGRESS NOTE    Madison Ho  NGE:[id number] DOB: [birth year] DOA: 05/07/2023 PCP: Ignatius Specking, MD   Brief Narrative:  72 year old female with past medical history of hypertension, hyperlipidemia, diabetes mellitus type 2, COPD and neuropathy who presented to [facility] emergency department with left arm and shoulder pain 40 pound weight loss and failure to thrive.   Patient was admitted to hospital service. Workup included CT imaging which revealed a large left lung mass concerning for primary lung malignancy.  Biopsy of left upper lobe mass on 10/9 was consistent with squamous cell lung cancer showing local invasion, right adrenal metastases and a solitary 8 mm intracranial metastasis on MRI.  Dr. Cherly Hensen with Medical Oncology was consulted as well as radiation oncology.  Patient was transferred to [facility] for initiation of palliative XRT, 10 fractions of palliative chest radiation.  Palliative care is also been consulted for their assistance.     Hospital course has had multiple complications including an episode of observed aspiration on 10/14.  Patient was briefly made n.p.o. and speech therapy evaluation was carried out followed by modified barium swallow on 10/16.  An appropriate dysphagia 2 diet was then ordered.  Additionally patient developed a left upper extremity DVT identified on ultrasound on 10/17 for which Lovenox was initiated.   Considering associated debility and weakness patient is anticipated to go to a skilled nursing facility upon discharge.   Assessment & Plan:   Active Problems:   Squamous cell lung cancer, left (HCC)   Acute deep vein thrombosis (DVT) of axillary vein of left upper extremity (HCC)   Left shoulder pain   Dysphagia   Toxic metabolic encephalopathy   Acute renal failure superimposed on stage 3a chronic kidney disease (HCC)   Type 2 diabetes mellitus with stage 3a chronic kidney disease, without long-term current use of insulin  (HCC)   Folate deficiency   Goals of care, counseling/discussion   Sepsis secondary to UTI (HCC)   Nicotine dependence, cigarettes, uncomplicated   Compression fracture of body of thoracic vertebra (HCC)   Mediastinal mass   Mass of left lung   DNR (do not resuscitate) discussion   Non-small cell lung cancer metastatic to adrenal gland (HCC)   Metastasis to brain (HCC)   Normocytic anemia   Low serum vitamin B12   Pressure injury of skin  Squamous cell lung cancer, left (HCC) Identification of large left lung mass upon this admission determined to be squamous cell lung cancer Masses substantial in size measuring 13 x 15 cm x 17 cm with encasement of the left main pulmonary artery with mass effect as well as abutting the distal trachea, encasing the left mainstem bronchus, occluding the left upper lobe bronchus and abutting the thoracic aorta. As a result patient is suffering from substantial left upper extremity pain and edema with concurrent development of DVT. Currently undergoing radiation therapy.  Patient has received 1 of 10 cycles thus far.  She needs 10 cycles, to be completed next week.  She went down today for the second cycle of radiation but could not hold her left arm up due to pain.  She was supposed to get morphine beforehand but radiation got moved up so she got the dose right when she was going so it did not kick in.  Family is not sure if they are going to try again for radiation today.  Morphine is ordered as needed every 2 hours.   Acute deep vein thrombosis (DVT) of axillary  vein of left upper extremity (HCC) Has edema.  Continue Lovenox.  Advised to keep it elevated.   Left shoulder pain Pain is felt to be completely related to ongoing lung malignancy  Patient is currently on Lidoderm patches, Tylenol and Ultram.  Morphine as needed.   Dysphagia Patient experienced substantial lethargy and confusion on 10/14 with observed aspiration thought to be secondary to  oxycodone Patient did receive a speech therapy evaluation and it was recommended the patient continue a dysphagia diet for now.   Toxic metabolic encephalopathy Substantial lethargy noted on 10/14, likely secondary to polypharmacy including repeated doses of oxycodone.  She is fully alert and oriented now.   Acute renal failure superimposed on stage 3a chronic kidney disease (HCC) Creatinine currently near baseline   Type 2 diabetes mellitus with stage 3a chronic kidney disease, without long-term current use of insulin (HCC) Good glycemic control Accu-Cheks before every meal and nightly with sliding scale insulin.   Folate deficiency Daily folic acid supplementation.   Goals of care, counseling/discussion Poor prognosis due to extensive progression of left lung squamous cell cancer Palliative care has been assisting in having goals of care discussions with family At this time patient is full code and is pursuing all available modalities of care.   Nicotine dependence, cigarettes, uncomplicated Counseling on cessation daily.   Sepsis secondary to UTI [facility]) Resolved, antibiotics complete.  Disposition: Patient needs to go to SNF.  Daughter has chosen either [place] or [facility].  None of those facilities are able to provide transportation back and forth for radiation on daily basis due to insurance issues.  Unfortunately, family is not able to provide transportation either.  For that reason, patient will need to stay in the hospital until completion of the radiation therapy (which will be at the end of the next week) and then discharged to SNF.  Discussed with TOC and daughter.  DVT prophylaxis: SCDs Start: 05/08/23 0552 Lovenox   Code Status: Full Code  Family Communication: Daughter present at bedside.  Plan of care discussed with patient in length and he/she verbalized understanding and agreed with it.  Status is: Inpatient Remains inpatient appropriate because: Receiving  radiation therapy on daily basis, cannot be discharged until completed radiation therapy by next week.  Details above.   Estimated body mass index is 31.73 kg/m as calculated from the following:   Height as of this encounter: 5\' 5"  (1.651 m).   Weight as of this encounter: 86.5 kg.  Pressure Injury 05/15/23 Sacrum Stage 1 -  Intact skin with non-blanchable redness of a localized area usually over a bony prominence. blanchable red area to sacrum (Active)  05/15/23 2210  Location: Sacrum  Location Orientation:   Staging: Stage 1 -  Intact skin with non-blanchable redness of a localized area usually over a bony prominence.  Wound Description (Comments): blanchable red area to sacrum  Present on Admission: Yes  Dressing Type Foam - Lift dressing to assess site every shift 05/20/23 2125   Nutritional Assessment: Body mass index is 31.73 kg/m.Marland Kitchen Seen by dietician.  I agree with the assessment and plan as outlined below: Nutrition Status:        . Skin Assessment: I have examined the patient's skin and I agree with the wound assessment as performed by the wound care RN as outlined below: Pressure Injury 05/15/23 Sacrum Stage 1 -  Intact skin with non-blanchable redness of a localized area usually over a bony prominence. blanchable red area to sacrum (Active)  05/15/23  2210  Location: Sacrum  Location Orientation:   Staging: Stage 1 -  Intact skin with non-blanchable redness of a localized area usually over a bony prominence.  Wound Description (Comments): blanchable red area to sacrum  Present on Admission: Yes  Dressing Type Foam - Lift dressing to assess site every shift 05/20/23 2125    Consultants:  Palliative care and radiation therapy  Procedures:  As above  Antimicrobials:  Anti-infectives (From admission, onward)    Start     Dose/Rate Route Frequency Ordered Stop   05/13/23 2015  cefTRIAXone (ROCEPHIN) 2 g in sodium chloride 0.9 % 100 mL IVPB  Status:  Discontinued         2 g 200 mL/hr over 30 Minutes Intravenous Every 24 hours 05/13/23 1919 05/14/23 0742   05/09/23 1000  vancomycin (VANCOCIN) IVPB 1000 mg/200 mL premix  Status:  Discontinued        1,000 mg 200 mL/hr over 60 Minutes Intravenous Every 24 hours 05/08/23 0558 05/09/23 1406   05/08/23 1230  cefTRIAXone (ROCEPHIN) 2 g in sodium chloride 0.9 % 100 mL IVPB        2 g 200 mL/hr over 30 Minutes Intravenous Every 24 hours 05/08/23 1226 05/10/23 1307   05/08/23 0045  aztreonam (AZACTAM) 2 g in sodium chloride 0.9 % 100 mL IVPB  Status:  Discontinued        2 g 200 mL/hr over 30 Minutes Intravenous  Once 05/08/23 0034 05/08/23 0037   05/08/23 0045  metroNIDAZOLE (FLAGYL) IVPB 500 mg        500 mg 100 mL/hr over 60 Minutes Intravenous  Once 05/08/23 0034 05/08/23 0435   05/08/23 0045  vancomycin (VANCOCIN) IVPB 1000 mg/200 mL premix  Status:  Discontinued        1,000 mg 200 mL/hr over 60 Minutes Intravenous  Once 05/08/23 0034 05/08/23 0036   05/08/23 0045  vancomycin (VANCOREADY) IVPB 1500 mg/300 mL        1,500 mg 150 mL/hr over 120 Minutes Intravenous  Once 05/08/23 0037 05/08/23 0647   05/08/23 0045  ceFEPIme (MAXIPIME) 2 g in sodium chloride 0.9 % 100 mL IVPB        2 g 200 mL/hr over 30 Minutes Intravenous  Once 05/08/23 0037 05/08/23 0334         Subjective: Patient seen and examined.  She just returned from radiation she is quite frustrated that she could not get it due to the pain.  Daughter at the bedside.  Objective: Vitals:   05/20/23 1406 05/20/23 2112 05/21/23 0553 05/21/23 0740  BP: 118/69 128/67 114/71   Pulse: (!) 104 (!) 109 (!) 108   Resp: 18 18 18    Temp: 98.4 F (36.9 C) 97.8 F (36.6 C) 97.8 F (36.6 C)   TempSrc: Oral Oral Oral   SpO2: 92% 91% 90% 93%  Weight:   86.5 kg   Height:        Intake/Output Summary (Last 24 hours) at 05/21/2023 1030 Last data filed at 05/21/2023 0850 Gross per 24 hour  Intake 50 ml  Output 650 ml  Net -600 ml   Filed  Weights   05/17/23 0423 05/20/23 0501 05/21/23 0553  Weight: 82.5 kg 87.6 kg 86.5 kg    Examination:  General exam: Appears calm and comfortable but appears frustrated Respiratory system: Clear to auscultation. Respiratory effort normal. Cardiovascular system: S1 & S2 heard, RRR. No JVD, murmurs, rubs, gallops or clicks.  Right lower extremity and left  upper extremity edema. Gastrointestinal system: Abdomen is nondistended, soft and nontender. No organomegaly or masses felt. Normal bowel sounds heard. Central nervous system: Alert and oriented. No focal neurological deficits. Extremities: Symmetric 5 x 5 power. Skin: No rashes, lesions or ulcers Psychiatry: Judgement and insight appear normal. Mood & affect appropriate.    Data Reviewed: I have personally reviewed following labs and imaging studies  CBC: Recent Labs  Lab 05/16/23 0537 05/17/23 0538 05/18/23 0556 05/19/23 1416  WBC 10.0 11.4* 11.9* 11.4*  NEUTROABS 6.4 8.2* 8.4* 8.9*  HGB 10.6* 10.9* 11.3* 11.3*  HCT 34.5* 36.2 37.9 38.0  MCV 98.3 98.6 99.5 100.3*  PLT 409* 412* 441* 397   Basic Metabolic Panel: Recent Labs  Lab 05/16/23 0537 05/17/23 0538 05/18/23 0556 05/18/23 1204 05/19/23 1416  NA 139 135 133* 135 132*  K 4.5 4.4 5.4* 5.1 5.5*  CL 95* 93* 91* 94* 91*  CO2 30 30 32 30 30  GLUCOSE 100* 120* 104* 120* 106*  BUN 20 24* 29* 32* 37*  CREATININE 1.03* 0.88 1.06* 1.13* 1.07*  CALCIUM 9.5 9.0 9.1 9.1 9.0  PHOS 4.8* 4.5 5.5*  --   --    GFR: Estimated Creatinine Clearance: 50.9 mL/min (A) (by C-G formula based on SCr of 1.07 mg/dL (H)). Liver Function Tests: Recent Labs  Lab 05/16/23 0537 05/17/23 0538 05/18/23 0556 05/19/23 1416  AST  --   --   --  22  ALT  --   --   --  14  ALKPHOS  --   --   --  52  BILITOT  --   --   --  0.4  PROT  --   --   --  6.2*  ALBUMIN 2.4* 2.2* 2.4* 2.3*   No results for input(s): "LIPASE", "AMYLASE" in the last 168 hours. No results for input(s): "AMMONIA" in  the last 168 hours. Coagulation Profile: Recent Labs  Lab 05/19/23 1416  INR 1.1   Cardiac Enzymes: No results for input(s): "CKTOTAL", "CKMB", "CKMBINDEX", "TROPONINI" in the last 168 hours. BNP (last 3 results) No results for input(s): "PROBNP" in the last 8760 hours. HbA1C: No results for input(s): "HGBA1C" in the last 72 hours. CBG: Recent Labs  Lab 05/20/23 0741 05/20/23 1128 05/20/23 1636 05/20/23 2114 05/21/23 0732  GLUCAP 92 80 97 100* 86   Lipid Profile: No results for input(s): "CHOL", "HDL", "LDLCALC", "TRIG", "CHOLHDL", "LDLDIRECT" in the last 72 hours. Thyroid Function Tests: No results for input(s): "TSH", "T4TOTAL", "FREET4", "T3FREE", "THYROIDAB" in the last 72 hours. Anemia Panel: No results for input(s): "VITAMINB12", "FOLATE", "FERRITIN", "TIBC", "IRON", "RETICCTPCT" in the last 72 hours. Sepsis Labs: No results for input(s): "PROCALCITON", "LATICACIDVEN" in the last 168 hours.  No results found for this or any previous visit (from the past 240 hour(s)).   Radiology Studies: No results found.  Scheduled Meds:  acetaminophen  1,000 mg Oral Q8H   vitamin B-12  1,000 mcg Oral Daily   enoxaparin (LOVENOX) injection  80 mg Subcutaneous Q12H   feeding supplement  237 mL Oral BID BM   folic acid  1 mg Oral Daily   gabapentin  100 mg Oral BID   insulin aspart  0-9 Units Subcutaneous TID AC & HS   lidocaine  2 patch Transdermal Q24H   mouth rinse  15 mL Mouth Rinse 4 times per day   polyethylene glycol  17 g Oral Daily   senna-docusate  1 tablet Oral QHS   umeclidinium bromide  1 puff Inhalation Daily   Continuous Infusions:   LOS: 13 days   Hughie Closs, MD [facility]  05/21/2023, 10:30 AM   *Please note that this is a verbal dictation therefore any spelling or grammatical errors are due to the "[employer]agon [employer] One" system interpretation.  Please page via Amion and do not message via secure chat for urgent patient care matters. Secure  chat can be used for non urgent patient care matters.  How to contact the [facility] Attending or Consulting provider 7A - 7P or covering provider during after hours 7P -7A, for this patient?  Check the care team in [facility] and look for a) attending/consulting TRH provider listed and b) the [facility] team listed. Page or secure chat 7A-7P. Log into [email or web address] and use [facility] [employer] [employer] universal password to access. If you do not have the password, please contact the hospital operator. Locate the [facility] provider you are looking for under [facility] and page to a number that you can be directly reached. If you still have difficulty reaching the provider, please page the [facility] (Director on Call) for the Hospitalists listed on amion for assistance.

## 2023-05-21 NOTE — Plan of Care (Signed)

## 2023-05-21 NOTE — Progress Notes (Addendum)
PROGRESS NOTE   Madison Ho  ZOX:[id number] DOB: June 16, 1951 DOA: 05/07/2023 PCP: Ignatius Specking, MD   Date of Service: the patient was seen and examined on 05/20/2023  Brief Narrative:  72 year old female with past medical history of hypertension, hyperlipidemia, diabetes mellitus type 2, COPD and neuropathy who presented to [facility] emergency department with left arm and shoulder pain 40 pound weight loss and failure to thrive.  Patient was admitted to hospital service for workup.  Workup included CT imaging which revealed a large left lung mass concerning for primary lung malignancy.  Biopsy of left upper lobe mass on 10/9 was consistent with squamous cell lung cancer showing local invasion, right adrenal metastases and a solitary 8 mm intracranial metastasis on MRI.  Dr. Cherly Hensen with Medical Oncology was consulted as well as radiation oncology.  Patient was transferred to [facility] for initiation of palliative XRT, 10 fractions of palliative chest radiation.  Palliative care is also been consulted for their assistance.    Hospital course has had multiple complications including an episode of observed aspiration on 10/14.  Patient was briefly made n.p.o. and speech therapy evaluation was carried out followed by modified barium swallow on 10/16.  An appropriate dysphagia 2 diet was then ordered.  Additionally patient developed a left upper extremity DVT identified on ultrasound on 10/17 for which Lovenox was initiated.  Considering associated debility and weakness patient is anticipated to go to a skilled nursing facility upon discharge.     Assessment and Plan: Squamous cell lung cancer, left (HCC) Identification of large left lung mass upon this admission determined to be squamous cell lung cancer Masses substantial in size measuring 13 x 15 cm x 17 cm with encasement of the left main pulmonary artery with mass effect as well as abutting the distal trachea, encasing  the left mainstem bronchus, occluding the left upper lobe bronchus and abutting the thoracic aorta. As a result patient is suffering from substantial left upper extremity pain and edema with concurrent development of DVT. Currently undergoing radiation therapy.  Patient has received 1 of 10 cycles thus far. Continuing analgesics including Tylenol, Duragesic passages and Ultram. After discussion with patient/family, they are agreeable for as needed low-dose morphine for severe pain  Acute deep vein thrombosis (DVT) of axillary vein of left upper extremity (HCC) Identified on upper extremity ultrasound performed 10/17  Clot likely secondary to hypercoagulable state of malignancy and diminished vascular flow of the left upper extremity. Now on Lovenox twice daily As needed analgesics for associated substantial pain.  Left shoulder pain Pain is felt to be completely related to ongoing lung malignancy  Patient is currently on Lidoderm patches, Tylenol and Ultram. After long discussion with patient and family today, they are agreeable to attempting as needed low-dose morphine for severe breakthrough pain to allow for patient to tolerate radiation treatments  Dysphagia Patient experienced substantial lethargy and confusion on 10/14 with observed aspiration thought to be secondary to oxycodone Patient did receive a speech therapy evaluation and it was recommended the patient continue a dysphagia diet for now. Will need to request repeat evaluation as patient's mentation is improved to baseline  Toxic metabolic encephalopathy Substantial lethargy noted on 10/14, likely secondary to polypharmacy including repeated doses of oxycodone. Mentation improved. Sparing use of analgesics Unlikely the patient's brain metastasis contributing at this time.   Acute renal failure superimposed on stage 3a chronic kidney disease (HCC) Creatinine currently near baseline  Type 2 diabetes mellitus with stage 3a  chronic kidney disease, without long-term current use of insulin (HCC) Good glycemic control Accu-Cheks before every meal and nightly with sliding scale insulin.  Folate deficiency Daily folic acid supplementation.  Goals of care, counseling/discussion Poor prognosis due to extensive progression of left lung squamous cell cancer Palliative care has been assisting in having goals of care discussions with family At this time patient is full code and is pursuing all available modalities of care.  Nicotine dependence, cigarettes, uncomplicated Counseling on cessation daily.  Sepsis secondary to UTI [facility]) Resolved, antibiotics complete.    Subjective:  Patient continuing to complain of left upper extremity pain albeit somewhat improved compared to yesterday.  Pain does range in intensity and can become quite severe.  Patient and family are agreeable to introduce low-dose intravenous morphine for periods of severe pain particularly in preparation for radiation therapy.  Physical Exam:  Vitals:   05/20/23 0501 05/20/23 0621    BP: 96/65     Pulse: 99     Resp: 16     Temp: 97.8 F (36.6 C)     TempSrc: Oral     SpO2: 95% 92%    Weight: 87.6 kg     Height:         Constitutional: Awake alert and oriented x3, no associated distress.   Skin: no rashes, no lesions, good skin turgor noted. Eyes: Pupils are equally reactive to light.  No evidence of scleral icterus or conjunctival pallor.  ENMT: Moist mucous membranes noted.  Posterior pharynx clear of any exudate or lesions.   Respiratory: clear to auscultation bilaterally, no wheezing, no crackles. Normal respiratory effort. No accessory muscle use.  Cardiovascular: Regular rate and rhythm, no murmurs / rubs / gallops.  Extensive  left upper extremity edema albeit slightly improved since yesterday.  2+ pedal pulses. No carotid bruits.  Abdomen: Abdomen is soft and nontender.  No evidence of intra-abdominal masses.  Positive bowel  sounds noted in all quadrants.   Musculoskeletal: No joint deformity upper and lower extremities. Good ROM, no contractures. Normal muscle tone.    Data Reviewed:  I have personally reviewed and interpreted labs, imaging.  Significant findings are   CBC: Recent Labs  Lab 05/14/23 0348 05/16/23 0537 05/17/23 0538 05/18/23 0556 05/19/23 1416  WBC 11.4* 10.0 11.4* 11.9* 11.4*  NEUTROABS 8.4* 6.4 8.2* 8.4* 8.9*  HGB 10.3* 10.6* 10.9* 11.3* 11.3*  HCT 33.7* 34.5* 36.2 37.9 38.0  MCV 96.8 98.3 98.6 99.5 100.3*  PLT 432* 409* 412* 441* 397   Basic Metabolic Panel: Recent Labs  Lab 05/16/23 0537 05/17/23 0538 05/18/23 0556 05/18/23 1204 05/19/23 1416  NA 139 135 133* 135 132*  K 4.5 4.4 5.4* 5.1 5.5*  CL 95* 93* 91* 94* 91*  CO2 30 30 32 30 30  GLUCOSE 100* 120* 104* 120* 106*  BUN 20 24* 29* 32* 37*  CREATININE 1.03* 0.88 1.06* 1.13* 1.07*  CALCIUM 9.5 9.0 9.1 9.1 9.0  PHOS 4.8* 4.5 5.5*  --   --    GFR: Estimated Creatinine Clearance: 51.2 mL/min (A) (by C-G formula based on SCr of 1.07 mg/dL (H)). Liver Function Tests: Recent Labs  Lab 05/16/23 0537 05/17/23 0538 05/18/23 0556 05/19/23 1416  AST  --   --   --  22  ALT  --   --   --  14  ALKPHOS  --   --   --  52  BILITOT  --   --   --  0.4  PROT  --   --   --  6.2*  ALBUMIN 2.4* 2.2* 2.4* 2.3*    Coagulation Profile: Recent Labs  Lab 05/19/23 1416  INR 1.1       Code Status:  Full code.  Code status decision has been confirmed with: patient Family Communication: Son and daughter have been updated on plan of care   Severity of Illness:  The appropriate patient status for this patient is INPATIENT. Inpatient status is judged to be reasonable and necessary in order to provide the required intensity of service to ensure the patient's safety. The patient's presenting symptoms, physical exam findings, and initial radiographic and laboratory data in the context of their chronic comorbidities is felt to  place them at high risk for further clinical deterioration. Furthermore, it is not anticipated that the patient will be medically stable for discharge from the hospital within 2 midnights of admission.   * I certify that at the point of admission it is my clinical judgment that the patient will require inpatient hospital care spanning beyond 2 midnights from the point of admission due to high intensity of service, high risk for further deterioration and high frequency of surveillance required.*  Time spent:  59 minutes  Author:  Marinda Elk MD  05/20/2023

## 2023-05-22 ENCOUNTER — Encounter (HOSPITAL_COMMUNITY): Payer: Self-pay

## 2023-05-22 ENCOUNTER — Ambulatory Visit
Admit: 2023-05-22 | Discharge: 2023-05-22 | Disposition: A | Discharge status: Home / Self Care | Payer: Medicare PPO | Attending: Radiation Oncology | Admitting: Radiation Oncology

## 2023-05-22 ENCOUNTER — Other Ambulatory Visit: Payer: Self-pay

## 2023-05-22 DIAGNOSIS — I82A12 Acute embolism and thrombosis of left axillary vein: Secondary | ICD-10-CM | POA: Diagnosis not present

## 2023-05-22 DIAGNOSIS — R222 Localized swelling, mass and lump, trunk: Secondary | ICD-10-CM | POA: Diagnosis not present

## 2023-05-22 DIAGNOSIS — G928 Other toxic encephalopathy: Secondary | ICD-10-CM | POA: Diagnosis not present

## 2023-05-22 DIAGNOSIS — M25512 Pain in left shoulder: Secondary | ICD-10-CM | POA: Diagnosis not present

## 2023-05-22 DIAGNOSIS — R5383 Other fatigue: Secondary | ICD-10-CM | POA: Diagnosis not present

## 2023-05-22 DIAGNOSIS — C3492 Malignant neoplasm of unspecified part of left bronchus or lung: Secondary | ICD-10-CM | POA: Diagnosis not present

## 2023-05-22 DIAGNOSIS — Z515 Encounter for palliative care: Secondary | ICD-10-CM | POA: Diagnosis not present

## 2023-05-22 DIAGNOSIS — Z7189 Other specified counseling: Secondary | ICD-10-CM | POA: Diagnosis not present

## 2023-05-22 LAB — RAD ONC ARIA SESSION SUMMARY
Course Elapsed Days: 5
Plan Fractions Treated to Date: 2
Plan Prescribed Dose Per Fraction: 3 Gy
Plan Total Fractions Prescribed: 10
Plan Total Prescribed Dose: 30 Gy
Reference Point Dosage Given to Date: 6 Gy
Reference Point Session Dosage Given: 3 Gy
Session Number: 2

## 2023-05-22 LAB — GLUCOSE, CAPILLARY
Glucose-Capillary: 78 mg/dL (ref 70–99)
Glucose-Capillary: 199 mg/dL — ABNORMAL HIGH (ref 70–99)
Glucose-Capillary: 122 mg/dL — ABNORMAL HIGH (ref 70–99)
Glucose-Capillary: 88 mg/dL (ref 70–99)

## 2023-05-22 NOTE — Plan of Care (Signed)

## 2023-05-22 NOTE — Progress Notes (Signed)
Physical Therapy Treatment Patient Details Name: Madison Ho MRN: [id number] DOB: [birth year] Today's Date: 05/22/2023   History of Present Illness Patient is 72 y.o. female presented to the [facility] 05/07/23 w/ arm and neck pain for ~2 months with recent weight loss and FTT. Pt found to have sepsis due to UTI, metastatic squamous cell carcinoma from left upper lobe mass with additional R adrenal metastases and 8mm mass at  the anterior/inferior aspect of the left lentiform nucleus. PMH significant for DM II, diabetic neuropathy,  HTN, HLD.    PT Comments   Pt admitted with above diagnosis.  Pt currently with functional limitations due to the deficits listed below (see PT Problem List). Pt returned from radiation today. Pt able to tolerate well and pain managed. Daughter present throughout therapy session. Pt required max A for supine <> sit, close S for sitting EOB for 2;51 during the course of static sitting EOB pt elected to perform trunk extension this being perpendicular on bed because she wanted to get back in bed, PT requested pt communicate with PT to perform task in safer and more efficient manner. Pt desaturated to 85% on 3 L/min and required 1;20 to recover to 90% with cues for pursed lip breathing once in bed. Pt required max A to reposition in bed, pt positioned with  L UE elevated on pillows as props per pt preference. Pt left in bed and all needs in place.  Pt will benefit from acute skilled PT to increase their independence and safety with mobility to allow discharge.      If plan is discharge home, recommend the following: A lot of help with bathing/dressing/bathroom;Assist for transportation;Help with stairs or ramp for entrance;Assistance with cooking/housework;Two people to help with walking and/or transfers   Can travel by private vehicle     No  Equipment Recommendations  None recommended by PT    Recommendations for Other Services       Precautions / Restrictions  Precautions Precautions: Fall Precaution Comments: 3L O2 Restrictions Weight Bearing Restrictions: No     Mobility  Bed Mobility Overal bed mobility: Needs Assistance Bed Mobility: Supine to Sit, Sit to Supine     Supine to sit: HOB elevated, Used rails, Max assist Sit to supine: Max assist   General bed mobility comments: heavy reliance on rail, increased time, limited use of L UE due to pain, pt required max A to reposition in bed with use of bed rails and boost on hospital bed    Transfers                   General transfer comment: NT    Ambulation/Gait               General Gait Details: NT for pt and staff safety   Stairs             Wheelchair Mobility     Tilt Bed    Modified Rankin (Stroke Patients Only)       Balance Overall balance assessment: Needs assistance Sitting-balance support: Bilateral upper extremity supported Sitting balance-Leahy Scale: Fair                                      Cognition Arousal: Alert Behavior During Therapy: Flat affect Overall Cognitive Status: Impaired/Different from baseline Area of Impairment: Problem solving  Problem Solving: Slow processing, Decreased initiation, Difficulty sequencing, Requires verbal cues General Comments: Requires repeated verbal cueing and encouragement for  initiation and participation.        Exercises      General Comments General comments (skin integrity, edema, etc.): 3 L/min supplemental O2      Pertinent Vitals/Pain Pain Assessment Faces Pain Scale: Hurts even more Pain Location: L arm Pain Descriptors / Indicators: Grimacing, Guarding, Discomfort    Home Living Family/patient expects to be discharged to:: Private residence Living Arrangements: Children;Other relatives Available Help at Discharge: Family;Available 24 hours/day Type of Home: House Home Access: Stairs to enter Entrance  Stairs-Rails: Right Entrance Stairs-Number of Steps: 7   Home Layout: One level Home Equipment: Rollator (4 wheels);Cane - quad;Cane - single point;BSC/3in1      Prior Function            PT Goals (current goals can now be found in the care plan section) Acute Rehab PT Goals Patient Stated Goal: per family: to build stregnth for treatment PT Goal Formulation: With patient/family Time For Goal Achievement: 05/31/23 Potential to Achieve Goals: Fair Progress towards PT goals: Not progressing toward goals - comment    Frequency    Min 1X/week      PT Plan      Co-evaluation              AM-PAC PT "6 Clicks" Mobility   Outcome Measure  Help needed turning from your back to your side while in a flat bed without using bedrails?: A Lot Help needed moving from lying on your back to sitting on the side of a flat bed without using bedrails?: A Lot Help needed moving to and from a bed to a chair (including a wheelchair)?: A Lot Help needed standing up from a chair using your arms (e.g., wheelchair or bedside chair)?: Total Help needed to walk in hospital room?: Total Help needed climbing 3-5 steps with a railing? : Total 6 Click Score: 9    End of Session Equipment Utilized During Treatment: Oxygen Activity Tolerance: Patient limited by fatigue;Patient limited by pain Patient left: in bed;with family/visitor present;with call bell/phone within reach Nurse Communication: Mobility status PT Visit Diagnosis: Muscle weakness (generalized) (M62.81);Unsteadiness on feet (R26.81)     Time: 6387-5643 PT Time Calculation (min) (ACUTE ONLY): 23 min  Charges:    $Therapeutic Activity: 23-37 mins PT General Charges $$ ACUTE PT VISIT: 1 Visit                     Johnny Bridge, PT Acute Rehab    Jacqualyn Posey 05/22/2023, 4:26 PM

## 2023-05-22 NOTE — Progress Notes (Signed)
OT Cancellation Note  Patient Details Name: Madison Ho MRN: [id number] DOB: [birth year]   Cancelled Treatment:    Reason Eval/Treat Not Completed: Fatigue/lethargy limiting ability to participate. Pt completed radiation and PT earlier today, fatigued and needing to rest. OT will re-attempt as able tomorrow.  Fabienne Nolasco L. Averill Pons, OTR/L  05/22/23, 4:22 PM

## 2023-05-22 NOTE — Progress Notes (Addendum)
PROGRESS NOTE    Madison Ho  ACZ:[id number] DOB: 1950/11/21 DOA: 05/07/2023 PCP: Ignatius Specking, MD   Brief Narrative:  72 year old female with past medical history of hypertension, hyperlipidemia, diabetes mellitus type 2, COPD and neuropathy who presented to [facility] emergency department with left arm and shoulder pain 40 pound weight loss and failure to thrive.   Patient was admitted to hospital service. Workup included CT imaging which revealed a large left lung mass concerning for primary lung malignancy.  Biopsy of left upper lobe mass on 10/9 was consistent with squamous cell lung cancer showing local invasion, right adrenal metastases and a solitary 8 mm intracranial metastasis on MRI.  Dr. Cherly Hensen with Medical Oncology was consulted as well as radiation oncology.  Patient was transferred to [facility] for initiation of palliative XRT, 10 fractions of palliative chest radiation.  Palliative care is also been consulted for their assistance.     Hospital course has had multiple complications including an episode of observed aspiration on 10/14.  Patient was briefly made n.p.o. and speech therapy evaluation was carried out followed by modified barium swallow on 10/16.  An appropriate dysphagia 2 diet was then ordered.  Additionally patient developed a left upper extremity DVT identified on ultrasound on 10/17 for which Lovenox was initiated.   Considering associated debility and weakness patient is anticipated to go to a skilled nursing facility upon discharge.   Assessment & Plan:   Active Problems:   Squamous cell lung cancer, left (HCC)   Acute deep vein thrombosis (DVT) of axillary vein of left upper extremity (HCC)   Left shoulder pain   Dysphagia   Toxic metabolic encephalopathy   Acute renal failure superimposed on stage 3a chronic kidney disease (HCC)   Type 2 diabetes mellitus with stage 3a chronic kidney disease, without long-term current use of insulin  (HCC)   Folate deficiency   Goals of care, counseling/discussion   Sepsis secondary to UTI (HCC)   Nicotine dependence, cigarettes, uncomplicated   Compression fracture of body of thoracic vertebra (HCC)   Mediastinal mass   Mass of left lung   DNR (do not resuscitate) discussion   Non-small cell lung cancer metastatic to adrenal gland (HCC)   Metastasis to brain (HCC)   Normocytic anemia   Low serum vitamin B12   Pressure injury of skin  Squamous cell lung cancer, left (HCC) Identification of large left lung mass upon this admission determined to be squamous cell lung cancer Masses substantial in size measuring 13 x 15 cm x 17 cm with encasement of the left main pulmonary artery with mass effect as well as abutting the distal trachea, encasing the left mainstem bronchus, occluding the left upper lobe bronchus and abutting the thoracic aorta. As a result patient is suffering from substantial left upper extremity pain and edema with concurrent development of DVT. Currently undergoing radiation therapy.  Patient has received 1 of 10 cycles thus far.  She needs 10 cycles, to be completed next week.  Morphine is ordered as needed every 2 hours.  She should get that at least an hour before going for radiation every day.   Acute deep vein thrombosis (DVT) of axillary vein of left upper extremity (HCC) Has edema.  Continue Lovenox.  Advised to keep it elevated.   Left shoulder pain Pain is felt to be completely related to ongoing lung malignancy  Patient is currently on Lidoderm patches, Tylenol and Ultram.  Morphine as needed.   Dysphagia Patient  experienced substantial lethargy and confusion on 10/14 with observed aspiration thought to be secondary to oxycodone Patient did receive a speech therapy evaluation and looks like patient was eventually advanced to consistent carbohydrate diet on 05/19/2023.   Toxic metabolic encephalopathy Substantial lethargy noted on 10/14, likely secondary to  polypharmacy including repeated doses of oxycodone.  She is fully alert and oriented now.   Acute renal failure superimposed on stage 3a chronic kidney disease (HCC) Creatinine currently near baseline   Type 2 diabetes mellitus with stage 3a chronic kidney disease, without long-term current use of insulin (HCC) Good glycemic control Accu-Cheks before every meal and nightly with sliding scale insulin.   Folate deficiency Daily folic acid supplementation.   Goals of care, counseling/discussion Poor prognosis due to extensive progression of left lung squamous cell cancer Palliative care has been assisting in having goals of care discussions with family At this time patient is full code and is pursuing all available modalities of care.   Nicotine dependence, cigarettes, uncomplicated Counseling on cessation daily.   Sepsis secondary to UTI [facility]) Resolved, antibiotics complete.  Disposition: Patient needs to go to SNF.  Daughter has chosen either [place] or [facility].  None of those facilities are able to provide transportation back and forth for radiation on daily basis due to insurance issues.  Unfortunately, family is not able to provide transportation either.  For that reason, patient will need to stay in the hospital until completion of the radiation therapy (which will be at the end of the next week) and then discharged to SNF.  Discussed with TOC and daughter.  DVT prophylaxis: SCDs Start: 05/08/23 0552 Lovenox   Code Status: Full Code  Family Communication: Daughter present at bedside.  Plan of care discussed with patient in length and he/she verbalized understanding and agreed with it.  Status is: Inpatient Remains inpatient appropriate because: Receiving radiation therapy on daily basis, cannot be discharged until completed radiation therapy by next week.  Details above.   Estimated body mass index is 31.81 kg/m as calculated from the following:   Height as of this  encounter: 5\' 5"  (1.651 m).   Weight as of this encounter: 86.7 kg.  Pressure Injury 05/15/23 Sacrum Stage 1 -  Intact skin with non-blanchable redness of a localized area usually over a bony prominence. blanchable red area to sacrum (Active)  05/15/23 2210  Location: Sacrum  Location Orientation:   Staging: Stage 1 -  Intact skin with non-blanchable redness of a localized area usually over a bony prominence.  Wound Description (Comments): blanchable red area to sacrum  Present on Admission: Yes  Dressing Type Foam - Lift dressing to assess site every shift 05/21/23 2110   Nutritional Assessment: Body mass index is 31.81 kg/m.Marland Kitchen Seen by dietician.  I agree with the assessment and plan as outlined below: Nutrition Status:        . Skin Assessment: I have examined the patient's skin and I agree with the wound assessment as performed by the wound care RN as outlined below: Pressure Injury 05/15/23 Sacrum Stage 1 -  Intact skin with non-blanchable redness of a localized area usually over a bony prominence. blanchable red area to sacrum (Active)  05/15/23 2210  Location: Sacrum  Location Orientation:   Staging: Stage 1 -  Intact skin with non-blanchable redness of a localized area usually over a bony prominence.  Wound Description (Comments): blanchable red area to sacrum  Present on Admission: Yes  Dressing Type Foam - Lift dressing to  assess site every shift 05/21/23 2110    Consultants:  Palliative care and radiation therapy  Procedures:  As above  Antimicrobials:  Anti-infectives (From admission, onward)    Start     Dose/Rate Route Frequency Ordered Stop   05/13/23 2015  cefTRIAXone (ROCEPHIN) 2 g in sodium chloride 0.9 % 100 mL IVPB  Status:  Discontinued        2 g 200 mL/hr over 30 Minutes Intravenous Every 24 hours 05/13/23 1919 05/14/23 0742   05/09/23 1000  vancomycin (VANCOCIN) IVPB 1000 mg/200 mL premix  Status:  Discontinued        1,000 mg 200 mL/hr over 60  Minutes Intravenous Every 24 hours 05/08/23 0558 05/09/23 1406   05/08/23 1230  cefTRIAXone (ROCEPHIN) 2 g in sodium chloride 0.9 % 100 mL IVPB        2 g 200 mL/hr over 30 Minutes Intravenous Every 24 hours 05/08/23 1226 05/10/23 1307   05/08/23 0045  aztreonam (AZACTAM) 2 g in sodium chloride 0.9 % 100 mL IVPB  Status:  Discontinued        2 g 200 mL/hr over 30 Minutes Intravenous  Once 05/08/23 0034 05/08/23 0037   05/08/23 0045  metroNIDAZOLE (FLAGYL) IVPB 500 mg        500 mg 100 mL/hr over 60 Minutes Intravenous  Once 05/08/23 0034 05/08/23 0435   05/08/23 0045  vancomycin (VANCOCIN) IVPB 1000 mg/200 mL premix  Status:  Discontinued        1,000 mg 200 mL/hr over 60 Minutes Intravenous  Once 05/08/23 0034 05/08/23 0036   05/08/23 0045  vancomycin (VANCOREADY) IVPB 1500 mg/300 mL        1,500 mg 150 mL/hr over 120 Minutes Intravenous  Once 05/08/23 0037 05/08/23 0647   05/08/23 0045  ceFEPIme (MAXIPIME) 2 g in sodium chloride 0.9 % 100 mL IVPB        2 g 200 mL/hr over 30 Minutes Intravenous  Once 05/08/23 0037 05/08/23 0334         Subjective: Seen and examined.  Complains of left shoulder pain.  No new complaint.  Daughter at the bedside.  Daughter is aware that she needs to remind the nurses to give pain medication to the patient an hour before she goes down for radiation every day.   Objective: Vitals:   05/21/23 1350 05/21/23 2132 05/22/23 0428 05/22/23 0807  BP: 114/69 108/70 99/63   Pulse: (!) 104  97   Resp: 20 16 16    Temp: 98.3 F (36.8 C) 98.3 F (36.8 C) (!) 97.4 F (36.3 C)   TempSrc: Oral Oral Oral   SpO2: 91% 93% 91% 96%  Weight:   86.7 kg   Height:        Intake/Output Summary (Last 24 hours) at 05/22/2023 0958 Last data filed at 05/22/2023 0429 Gross per 24 hour  Intake --  Output 100 ml  Net -100 ml   Filed Weights   05/20/23 0501 05/21/23 0553 05/22/23 0428  Weight: 87.6 kg 86.5 kg 86.7 kg    Examination:  General exam: Appears calm and  comfortable  Respiratory system: Clear to auscultation. Respiratory effort normal. Cardiovascular system: S1 & S2 heard, RRR. No JVD, murmurs, rubs, gallops or clicks.  Left upper extremity and right lower extremity edema. Gastrointestinal system: Abdomen is nondistended, soft and nontender. No organomegaly or masses felt. Normal bowel sounds heard. Central nervous system: Alert and oriented. No focal neurological deficits. Extremities: Symmetric 5 x 5 power.  Skin: No rashes, lesions or ulcers.  Psychiatry: Judgement and insight appear normal. Mood & affect appropriate.    Data Reviewed: I have personally reviewed following labs and imaging studies  CBC: Recent Labs  Lab 05/16/23 0537 05/17/23 0538 05/18/23 0556 05/19/23 1416  WBC 10.0 11.4* 11.9* 11.4*  NEUTROABS 6.4 8.2* 8.4* 8.9*  HGB 10.6* 10.9* 11.3* 11.3*  HCT 34.5* 36.2 37.9 38.0  MCV 98.3 98.6 99.5 100.3*  PLT 409* 412* 441* 397   Basic Metabolic Panel: Recent Labs  Lab 05/16/23 0537 05/17/23 0538 05/18/23 0556 05/18/23 1204 05/19/23 1416  NA 139 135 133* 135 132*  K 4.5 4.4 5.4* 5.1 5.5*  CL 95* 93* 91* 94* 91*  CO2 30 30 32 30 30  GLUCOSE 100* 120* 104* 120* 106*  BUN 20 24* 29* 32* 37*  CREATININE 1.03* 0.88 1.06* 1.13* 1.07*  CALCIUM 9.5 9.0 9.1 9.1 9.0  PHOS 4.8* 4.5 5.5*  --   --    GFR: Estimated Creatinine Clearance: 50.9 mL/min (A) (by C-G formula based on SCr of 1.07 mg/dL (H)). Liver Function Tests: Recent Labs  Lab 05/16/23 0537 05/17/23 0538 05/18/23 0556 05/19/23 1416  AST  --   --   --  22  ALT  --   --   --  14  ALKPHOS  --   --   --  52  BILITOT  --   --   --  0.4  PROT  --   --   --  6.2*  ALBUMIN 2.4* 2.2* 2.4* 2.3*   No results for input(s): "LIPASE", "AMYLASE" in the last 168 hours. No results for input(s): "AMMONIA" in the last 168 hours. Coagulation Profile: Recent Labs  Lab 05/19/23 1416  INR 1.1   Cardiac Enzymes: No results for input(s): "CKTOTAL", "CKMB",  "CKMBINDEX", "TROPONINI" in the last 168 hours. BNP (last 3 results) No results for input(s): "PROBNP" in the last 8760 hours. HbA1C: No results for input(s): "HGBA1C" in the last 72 hours. CBG: Recent Labs  Lab 05/21/23 0732 05/21/23 1345 05/21/23 1603 05/21/23 2133 05/22/23 0744  GLUCAP 86 98 99 152* 78   Lipid Profile: No results for input(s): "CHOL", "HDL", "LDLCALC", "TRIG", "CHOLHDL", "LDLDIRECT" in the last 72 hours. Thyroid Function Tests: No results for input(s): "TSH", "T4TOTAL", "FREET4", "T3FREE", "THYROIDAB" in the last 72 hours. Anemia Panel: No results for input(s): "VITAMINB12", "FOLATE", "FERRITIN", "TIBC", "IRON", "RETICCTPCT" in the last 72 hours. Sepsis Labs: No results for input(s): "PROCALCITON", "LATICACIDVEN" in the last 168 hours.  No results found for this or any previous visit (from the past 240 hour(s)).   Radiology Studies: No results found.  Scheduled Meds:  acetaminophen  1,000 mg Oral Q8H   vitamin B-12  1,000 mcg Oral Daily   enoxaparin (LOVENOX) injection  80 mg Subcutaneous Q12H   feeding supplement  237 mL Oral BID BM   folic acid  1 mg Oral Daily   gabapentin  100 mg Oral BID   insulin aspart  0-9 Units Subcutaneous TID AC & HS   lidocaine  2 patch Transdermal Q24H    morphine injection  2 mg Intravenous Once   mouth rinse  15 mL Mouth Rinse 4 times per day   polyethylene glycol  17 g Oral Daily   senna-docusate  1 tablet Oral QHS   umeclidinium bromide  1 puff Inhalation Daily   Continuous Infusions:   LOS: 14 days   Hughie Closs, MD [facility]  05/22/2023, 9:58 AM   *  Please note that this is a verbal dictation therefore any spelling or grammatical errors are due to the "[employer] One" system interpretation.  Please page via Amion and do not message via secure chat for urgent patient care matters. Secure chat can be used for non urgent patient care matters.  How to contact the [facility] Attending or Consulting  provider 7A - 7P or covering provider during after hours 7P -7A, for this patient?  Check the care team in [facility] and look for a) attending/consulting [facility] provider listed and b) the [facility] team listed. Page or secure chat 7A-7P. Log into [email or web address] and use [place]'s universal password to access. If you do not have the password, please contact the hospital operator. Locate the [facility] provider you are looking for under [facility] and page to a number that you can be directly reached. If you still have difficulty reaching the provider, please page the [facility] (Director on Call) for the Hospitalists listed on amion for assistance.

## 2023-05-22 NOTE — Progress Notes (Signed)
Accession #:    [id number] Date of Birth: [birth year]      Patient Gender: F Patient Age:   72 years Exam Location:  [facility] Procedure:      VAS [place] UPPER EXTREMITY VENOUS DUPLEX Referring Phys: Hazeline Junker --------------------------------------------------------------------------------  Indications: Pain, and Swelling Risk Factors: Recent diagnosis of metastatic lung cancer. Limitations: Poor ultrasound/tissue interface and mass of upper lobe of left lung, patient unable to position properly due to pain. Comparison Study: No previous exams Performing Technologist: Jody Hill RVT, RDMS  Examination Guidelines: A complete evaluation includes B-mode imaging, spectral Doppler, color Doppler, and power Doppler as needed of all accessible portions of each vessel. Bilateral testing is considered an integral part of a complete examination. Limited examinations for reoccurring indications may be performed as noted.  Right Findings: +----------+------------+---------+-----------+----------+--------------------+ RIGHT     CompressiblePhasicitySpontaneousProperties      Summary        +----------+------------+---------+-----------+----------+--------------------+ Subclavian               Yes       Yes                   patent by                                                              color/doppler     +----------+------------+---------+-----------+----------+--------------------+  Left Findings: +----------+------------+---------+-----------+----------+-----------------+  LEFT      CompressiblePhasicitySpontaneousProperties     Summary      +----------+------------+---------+-----------+----------+-----------------+ IJV           Full       Yes       Yes                                +----------+------------+---------+-----------+----------+-----------------+ Subclavian                                           Not visualized   +----------+------------+---------+-----------+----------+-----------------+ Axillary      None       No        No                     Acute       +----------+------------+---------+-----------+----------+-----------------+ Brachial      Full                                                    +----------+------------+---------+-----------+----------+-----------------+ Radial        Full                                                    +----------+------------+---------+-----------+----------+-----------------+ Ulnar  Accession #:    [id number] Date of Birth: [birth year]      Patient Gender: F Patient Age:   72 years Exam Location:  [facility] Procedure:      VAS [place] UPPER EXTREMITY VENOUS DUPLEX Referring Phys: Hazeline Junker --------------------------------------------------------------------------------  Indications: Pain, and Swelling Risk Factors: Recent diagnosis of metastatic lung cancer. Limitations: Poor ultrasound/tissue interface and mass of upper lobe of left lung, patient unable to position properly due to pain. Comparison Study: No previous exams Performing Technologist: Jody Hill RVT, RDMS  Examination Guidelines: A complete evaluation includes B-mode imaging, spectral Doppler, color Doppler, and power Doppler as needed of all accessible portions of each vessel. Bilateral testing is considered an integral part of a complete examination. Limited examinations for reoccurring indications may be performed as noted.  Right Findings: +----------+------------+---------+-----------+----------+--------------------+ RIGHT     CompressiblePhasicitySpontaneousProperties      Summary        +----------+------------+---------+-----------+----------+--------------------+ Subclavian               Yes       Yes                   patent by                                                              color/doppler     +----------+------------+---------+-----------+----------+--------------------+  Left Findings: +----------+------------+---------+-----------+----------+-----------------+  LEFT      CompressiblePhasicitySpontaneousProperties     Summary      +----------+------------+---------+-----------+----------+-----------------+ IJV           Full       Yes       Yes                                +----------+------------+---------+-----------+----------+-----------------+ Subclavian                                           Not visualized   +----------+------------+---------+-----------+----------+-----------------+ Axillary      None       No        No                     Acute       +----------+------------+---------+-----------+----------+-----------------+ Brachial      Full                                                    +----------+------------+---------+-----------+----------+-----------------+ Radial        Full                                                    +----------+------------+---------+-----------+----------+-----------------+ Ulnar  Madison Ho   DOB:March 18, 1951   ZO#:[id number]    ASSESSMENT & PLAN:  72 year old pleasant female with medical history of  type II DM, diabetic neuropathy, venous stasis, chronic left shoulder pain is admitted for fatigue found to have metastatic squamous cell carcinoma from left lung mass biopsy.     05/11/23 oncology consult. Discussed with patient and family there is large left lung mass invading various structures, right adrenal metastases.  Her findings are more consistent with stage IV or metastatic lung cancer.  We discussed metastatic lung cancer is generally not curable, however treatable.  This will depend on patient's performance status as well.  Family reported previously in good function 3 months ago and she has excellent memory.  We discussed the next step will be obtaining MRI of the brain to complete staging.  We will request foundation 1 testing for molecular testing.  Discussed that in general, first step is to look for actionable mutation.  If there is actionable mutation, patient will consider for targeted therapy.  If not, PD-L1 expression can be will be next step evaluate for immunotherapy.  Chemoimmunotherapy can be considered in patient with good functional status without other good options.    Patient and family expressed they all would like to pursue cancer directed treatment.    10/12 MRI brain: Solitary 8 mm intracranial metastasis  Discussed results. She is asymptomatic. There is no cerebral edema. Will consult radiation oncology for evaluation of SRS and palliative XRT.  Updated family including son and daughter.   10/14 reported aspiration like symptoms, somnolent and patient feels weak and performance status seem to be limited.  Discussed goals of care, son was on the phone with daughter at bedside.  Long discussion on goals of care.  Given her physical status is not improving, I am concerning about likelihood of complication from her malignancy, as well as undergoing  more treatment.  Discussed DNR, hospice in the setting if she were to decline physically and not strong enough for cancer directed treatment.  Discussed importance of quality of life and keep her comfortable and not prolong her suffering.  Discussed patient can potentially develop significant complication, expected in and out of the hospital, admission, emergency room throughout the course of treatment.  Daughter said they will discuss among each other and son is not ready for this.   10/15 patient more alert today.  Responding to questions appropriately.  Pain over the left shoulder blade back area secondary to malignancy. Discussed with patient, daughter in the room, and son over the phone, our radiation oncology colleagues will be seeing and determine if she is a candidate for palliative radiation.  I have requested Foundation One testing for her.  This may take 2 weeks.  If she continues to improve, and able to complete palliative radiation, we will have her follow-up with Dr. Shirline Frees, lung cancer specialist to discuss NGS results, and treatment plan.  If she were to decline clinically, then goals of care should be readdressed and consider palliative care with hospice.  10/17 started palliative radiation planned for 30 Gy in 10 Fx  Currently stable.  Brain metastasis Solitary. She is asymptomatic. There is no cerebral edema. Hold on start steroid for now. Pending evaluation for SRS   [id number] SCC of lung MRI of the brain showed solitary met at the anterior/inferior aspect of the left lentiform nucleus Single right adrenal metastasis Requested Foundation 1 testing. This can be follow-up as outpatient Continue palliative radiation.  Appreciate radiation oncology management  Not visualized   +----------+------------+---------+-----------+----------+-----------------+ Cephalic      None       No        No               Age Indeterminate +----------+------------+---------+-----------+----------+-----------------+ Basilic       Full       Yes       Yes                                +----------+------------+---------+-----------+----------+-----------------+ Sluggish flow seen in IJV and waveform phasic but diminished. Unable to visualize subclavian vein due to overlying mass. Ulner veins not visualized due to overlying edema and patient inability to move arm.  Summary:  Right: No evidence of thrombosis in the subclavian.  Left: Findings consistent with acute deep vein thrombosis involving the left axillary vein. Findings consistent with age indeterminate superficial vein thrombosis involving the forearm portion of left cephalic vein. However, unable to visualize the subclavian and ulnar veins. Subcutaneous  edema extending from mid upper arm to wrist,  *See table(s) above for measurements and observations.  Diagnosing physician: Heath Lark Electronically signed by Heath Lark on 05/17/2023 at 4:20:10 PM.    Final    DG Swallowing Func-Speech Pathology  Result Date: 05/16/2023 Table formatting from the original result was not included. Modified Barium Swallow Study Patient Details Name: Madison Ho MRN: [id number] Date of Birth: January 13, 1951 Today's Date: 05/16/2023 HPI/PMH: HPI: 72 year old home dwelling female admitted and found to have lung mass and now brain mets. Pt observed to cough with meals. PMH: HTN HLD DM TY 2 BMI 30  Prior smoker until about 2 months ago  Came to emergency room 10/8 with left shoulder pain 40 pound weight loss failure to thrive low-grade fever temp tachycardia and hypotension responsive to IV fluid . CT scan = large left lung mass concerning for primary lung malignancy-  10/9 biopsy left upper lobe showing metastatic squamous cell CA   10/11 oncology consult 10-CT 4N2M1 SCC lung. MRI brain shows Solitary 8 mm intracranial metastasis at the anterior/inferior  aspect of the left lentiform nucleus. No significant mass effect.  CXR showed extensive left upper lobe  lung mass with veil like opacification over the left lower lobe.  2. Signs of venous congestion within the right lung. Clinical Impression: Clinical Impression: Patient presents with mild oropharyngeal dysphagia mostly characterized by impaired lingual control resulting in minimal mastication with solids and oral retention with liquid.  Pharyngeal swallow marked by impaired laryngeal elevation and closure allowing aspiration of thin liquid prompting weak nonproductive cough.  Chin tuck posture with thin prevented aspiration and allowed minimal laryngeal penetration that cleared with cued cough.  Fortunately pharyngeal swallow is strong without retention.  At this time recommend continue pure and nectar thick liquid diet  allowing thin liquid between meals with chin tuck posture technique.  Patient did not have dentures in place for testing and suspect that they may have assisted her mastication abilities therefore we will follow-up regarding solids.  Of note barium tablet appeared to halt at mid esophagus without patient awareness.  Further boluses of pudding and nectar thick liquid did not effectively clear.  Following examination patient reported occasional sensation of lodging of food and liquid in her pharynx, pointing distally, prompting her to cough and expectorate at times.  Suspect retention in esophagus is source of those said symptoms. Factors that may increase risk of adverse event in  Accession #:    [id number] Date of Birth: [birth year]      Patient Gender: F Patient Age:   72 years Exam Location:  [facility] Procedure:      VAS [place] UPPER EXTREMITY VENOUS DUPLEX Referring Phys: Hazeline Junker --------------------------------------------------------------------------------  Indications: Pain, and Swelling Risk Factors: Recent diagnosis of metastatic lung cancer. Limitations: Poor ultrasound/tissue interface and mass of upper lobe of left lung, patient unable to position properly due to pain. Comparison Study: No previous exams Performing Technologist: Jody Hill RVT, RDMS  Examination Guidelines: A complete evaluation includes B-mode imaging, spectral Doppler, color Doppler, and power Doppler as needed of all accessible portions of each vessel. Bilateral testing is considered an integral part of a complete examination. Limited examinations for reoccurring indications may be performed as noted.  Right Findings: +----------+------------+---------+-----------+----------+--------------------+ RIGHT     CompressiblePhasicitySpontaneousProperties      Summary        +----------+------------+---------+-----------+----------+--------------------+ Subclavian               Yes       Yes                   patent by                                                              color/doppler     +----------+------------+---------+-----------+----------+--------------------+  Left Findings: +----------+------------+---------+-----------+----------+-----------------+  LEFT      CompressiblePhasicitySpontaneousProperties     Summary      +----------+------------+---------+-----------+----------+-----------------+ IJV           Full       Yes       Yes                                +----------+------------+---------+-----------+----------+-----------------+ Subclavian                                           Not visualized   +----------+------------+---------+-----------+----------+-----------------+ Axillary      None       No        No                     Acute       +----------+------------+---------+-----------+----------+-----------------+ Brachial      Full                                                    +----------+------------+---------+-----------+----------+-----------------+ Radial        Full                                                    +----------+------------+---------+-----------+----------+-----------------+ Ulnar  Madison Ho   DOB:March 18, 1951   ZO#:[id number]    ASSESSMENT & PLAN:  72 year old pleasant female with medical history of  type II DM, diabetic neuropathy, venous stasis, chronic left shoulder pain is admitted for fatigue found to have metastatic squamous cell carcinoma from left lung mass biopsy.     05/11/23 oncology consult. Discussed with patient and family there is large left lung mass invading various structures, right adrenal metastases.  Her findings are more consistent with stage IV or metastatic lung cancer.  We discussed metastatic lung cancer is generally not curable, however treatable.  This will depend on patient's performance status as well.  Family reported previously in good function 3 months ago and she has excellent memory.  We discussed the next step will be obtaining MRI of the brain to complete staging.  We will request foundation 1 testing for molecular testing.  Discussed that in general, first step is to look for actionable mutation.  If there is actionable mutation, patient will consider for targeted therapy.  If not, PD-L1 expression can be will be next step evaluate for immunotherapy.  Chemoimmunotherapy can be considered in patient with good functional status without other good options.    Patient and family expressed they all would like to pursue cancer directed treatment.    10/12 MRI brain: Solitary 8 mm intracranial metastasis  Discussed results. She is asymptomatic. There is no cerebral edema. Will consult radiation oncology for evaluation of SRS and palliative XRT.  Updated family including son and daughter.   10/14 reported aspiration like symptoms, somnolent and patient feels weak and performance status seem to be limited.  Discussed goals of care, son was on the phone with daughter at bedside.  Long discussion on goals of care.  Given her physical status is not improving, I am concerning about likelihood of complication from her malignancy, as well as undergoing  more treatment.  Discussed DNR, hospice in the setting if she were to decline physically and not strong enough for cancer directed treatment.  Discussed importance of quality of life and keep her comfortable and not prolong her suffering.  Discussed patient can potentially develop significant complication, expected in and out of the hospital, admission, emergency room throughout the course of treatment.  Daughter said they will discuss among each other and son is not ready for this.   10/15 patient more alert today.  Responding to questions appropriately.  Pain over the left shoulder blade back area secondary to malignancy. Discussed with patient, daughter in the room, and son over the phone, our radiation oncology colleagues will be seeing and determine if she is a candidate for palliative radiation.  I have requested Foundation One testing for her.  This may take 2 weeks.  If she continues to improve, and able to complete palliative radiation, we will have her follow-up with Dr. Shirline Frees, lung cancer specialist to discuss NGS results, and treatment plan.  If she were to decline clinically, then goals of care should be readdressed and consider palliative care with hospice.  10/17 started palliative radiation planned for 30 Gy in 10 Fx  Currently stable.  Brain metastasis Solitary. She is asymptomatic. There is no cerebral edema. Hold on start steroid for now. Pending evaluation for SRS   [id number] SCC of lung MRI of the brain showed solitary met at the anterior/inferior aspect of the left lentiform nucleus Single right adrenal metastasis Requested Foundation 1 testing. This can be follow-up as outpatient Continue palliative radiation.  Appreciate radiation oncology management  Madison Ho   DOB:March 18, 1951   ZO#:[id number]    ASSESSMENT & PLAN:  72 year old pleasant female with medical history of  type II DM, diabetic neuropathy, venous stasis, chronic left shoulder pain is admitted for fatigue found to have metastatic squamous cell carcinoma from left lung mass biopsy.     05/11/23 oncology consult. Discussed with patient and family there is large left lung mass invading various structures, right adrenal metastases.  Her findings are more consistent with stage IV or metastatic lung cancer.  We discussed metastatic lung cancer is generally not curable, however treatable.  This will depend on patient's performance status as well.  Family reported previously in good function 3 months ago and she has excellent memory.  We discussed the next step will be obtaining MRI of the brain to complete staging.  We will request foundation 1 testing for molecular testing.  Discussed that in general, first step is to look for actionable mutation.  If there is actionable mutation, patient will consider for targeted therapy.  If not, PD-L1 expression can be will be next step evaluate for immunotherapy.  Chemoimmunotherapy can be considered in patient with good functional status without other good options.    Patient and family expressed they all would like to pursue cancer directed treatment.    10/12 MRI brain: Solitary 8 mm intracranial metastasis  Discussed results. She is asymptomatic. There is no cerebral edema. Will consult radiation oncology for evaluation of SRS and palliative XRT.  Updated family including son and daughter.   10/14 reported aspiration like symptoms, somnolent and patient feels weak and performance status seem to be limited.  Discussed goals of care, son was on the phone with daughter at bedside.  Long discussion on goals of care.  Given her physical status is not improving, I am concerning about likelihood of complication from her malignancy, as well as undergoing  more treatment.  Discussed DNR, hospice in the setting if she were to decline physically and not strong enough for cancer directed treatment.  Discussed importance of quality of life and keep her comfortable and not prolong her suffering.  Discussed patient can potentially develop significant complication, expected in and out of the hospital, admission, emergency room throughout the course of treatment.  Daughter said they will discuss among each other and son is not ready for this.   10/15 patient more alert today.  Responding to questions appropriately.  Pain over the left shoulder blade back area secondary to malignancy. Discussed with patient, daughter in the room, and son over the phone, our radiation oncology colleagues will be seeing and determine if she is a candidate for palliative radiation.  I have requested Foundation One testing for her.  This may take 2 weeks.  If she continues to improve, and able to complete palliative radiation, we will have her follow-up with Dr. Shirline Frees, lung cancer specialist to discuss NGS results, and treatment plan.  If she were to decline clinically, then goals of care should be readdressed and consider palliative care with hospice.  10/17 started palliative radiation planned for 30 Gy in 10 Fx  Currently stable.  Brain metastasis Solitary. She is asymptomatic. There is no cerebral edema. Hold on start steroid for now. Pending evaluation for SRS   [id number] SCC of lung MRI of the brain showed solitary met at the anterior/inferior aspect of the left lentiform nucleus Single right adrenal metastasis Requested Foundation 1 testing. This can be follow-up as outpatient Continue palliative radiation.  Appreciate radiation oncology management  Not visualized   +----------+------------+---------+-----------+----------+-----------------+ Cephalic      None       No        No               Age Indeterminate +----------+------------+---------+-----------+----------+-----------------+ Basilic       Full       Yes       Yes                                +----------+------------+---------+-----------+----------+-----------------+ Sluggish flow seen in IJV and waveform phasic but diminished. Unable to visualize subclavian vein due to overlying mass. Ulner veins not visualized due to overlying edema and patient inability to move arm.  Summary:  Right: No evidence of thrombosis in the subclavian.  Left: Findings consistent with acute deep vein thrombosis involving the left axillary vein. Findings consistent with age indeterminate superficial vein thrombosis involving the forearm portion of left cephalic vein. However, unable to visualize the subclavian and ulnar veins. Subcutaneous  edema extending from mid upper arm to wrist,  *See table(s) above for measurements and observations.  Diagnosing physician: Heath Lark Electronically signed by Heath Lark on 05/17/2023 at 4:20:10 PM.    Final    DG Swallowing Func-Speech Pathology  Result Date: 05/16/2023 Table formatting from the original result was not included. Modified Barium Swallow Study Patient Details Name: Madison Ho MRN: [id number] Date of Birth: January 13, 1951 Today's Date: 05/16/2023 HPI/PMH: HPI: 72 year old home dwelling female admitted and found to have lung mass and now brain mets. Pt observed to cough with meals. PMH: HTN HLD DM TY 2 BMI 30  Prior smoker until about 2 months ago  Came to emergency room 10/8 with left shoulder pain 40 pound weight loss failure to thrive low-grade fever temp tachycardia and hypotension responsive to IV fluid . CT scan = large left lung mass concerning for primary lung malignancy-  10/9 biopsy left upper lobe showing metastatic squamous cell CA   10/11 oncology consult 10-CT 4N2M1 SCC lung. MRI brain shows Solitary 8 mm intracranial metastasis at the anterior/inferior  aspect of the left lentiform nucleus. No significant mass effect.  CXR showed extensive left upper lobe  lung mass with veil like opacification over the left lower lobe.  2. Signs of venous congestion within the right lung. Clinical Impression: Clinical Impression: Patient presents with mild oropharyngeal dysphagia mostly characterized by impaired lingual control resulting in minimal mastication with solids and oral retention with liquid.  Pharyngeal swallow marked by impaired laryngeal elevation and closure allowing aspiration of thin liquid prompting weak nonproductive cough.  Chin tuck posture with thin prevented aspiration and allowed minimal laryngeal penetration that cleared with cued cough.  Fortunately pharyngeal swallow is strong without retention.  At this time recommend continue pure and nectar thick liquid diet  allowing thin liquid between meals with chin tuck posture technique.  Patient did not have dentures in place for testing and suspect that they may have assisted her mastication abilities therefore we will follow-up regarding solids.  Of note barium tablet appeared to halt at mid esophagus without patient awareness.  Further boluses of pudding and nectar thick liquid did not effectively clear.  Following examination patient reported occasional sensation of lodging of food and liquid in her pharynx, pointing distally, prompting her to cough and expectorate at times.  Suspect retention in esophagus is source of those said symptoms. Factors that may increase risk of adverse event in  Accession #:    [id number] Date of Birth: [birth year]      Patient Gender: F Patient Age:   72 years Exam Location:  [facility] Procedure:      VAS [place] UPPER EXTREMITY VENOUS DUPLEX Referring Phys: Hazeline Junker --------------------------------------------------------------------------------  Indications: Pain, and Swelling Risk Factors: Recent diagnosis of metastatic lung cancer. Limitations: Poor ultrasound/tissue interface and mass of upper lobe of left lung, patient unable to position properly due to pain. Comparison Study: No previous exams Performing Technologist: Jody Hill RVT, RDMS  Examination Guidelines: A complete evaluation includes B-mode imaging, spectral Doppler, color Doppler, and power Doppler as needed of all accessible portions of each vessel. Bilateral testing is considered an integral part of a complete examination. Limited examinations for reoccurring indications may be performed as noted.  Right Findings: +----------+------------+---------+-----------+----------+--------------------+ RIGHT     CompressiblePhasicitySpontaneousProperties      Summary        +----------+------------+---------+-----------+----------+--------------------+ Subclavian               Yes       Yes                   patent by                                                              color/doppler     +----------+------------+---------+-----------+----------+--------------------+  Left Findings: +----------+------------+---------+-----------+----------+-----------------+  LEFT      CompressiblePhasicitySpontaneousProperties     Summary      +----------+------------+---------+-----------+----------+-----------------+ IJV           Full       Yes       Yes                                +----------+------------+---------+-----------+----------+-----------------+ Subclavian                                           Not visualized   +----------+------------+---------+-----------+----------+-----------------+ Axillary      None       No        No                     Acute       +----------+------------+---------+-----------+----------+-----------------+ Brachial      Full                                                    +----------+------------+---------+-----------+----------+-----------------+ Radial        Full                                                    +----------+------------+---------+-----------+----------+-----------------+ Ulnar

## 2023-05-22 NOTE — Progress Notes (Signed)
Daily Progress Note   Patient Name: Madison Ho       Date: 05/22/2023 DOB: 1950/09/03  Age: 72 y.o. MRN#: [id number] Attending Physician: Hughie Closs, MD Primary Care Physician: Ignatius Specking, MD Admit Date: 05/07/2023 Length of Stay: 14 days  Reason for Consultation/Follow-up: Establishing goals of care  HPI/Patient Profile:  72 y.o. female  with past medical history of hypertension, hyperlipidemia, diabetes, neuropathy, obesity admitted on 05/07/2023 with significant weight loss, weakness, left shoulder pain and left chest swelling. Diagnosed with potential UTI and aspiration pneumonia but also with metastatic lung care to right adrenal, brain. Tentative plans for SRS radiation to brain but with ongoing decline will reassess goals of care before proceeding with treatment.   Subjective:   Subjective: Chart Reviewed. Updates received. Patient Assessed. Created space and opportunity for patient  and family to explore thoughts and feelings regarding current medical situation.  Today's Discussion: Today I saw the patient at the bedside, daughter was also present.  Today she states she is having pain all over, although the nurse states that she just received an Ultram a few minutes ago.  She requested to "give her a shot and put her to sleep" but I told her that we could not do that.  Her daughter feels her mom is still fighting as she is still "sassy" with her.  We agreed to take it a day at a time but if she has significant decline we could rediscuss our goals and options.  Per nursing radiation treatment has agreed to try repositioning the patient for increased comfort during her treatments.  Nursing is also planning to administer IV morphine on her way out the door to help as well.  Right now the plan for discharge is for SNF/rehab, although they will have difficulty until radiation is completed because all the skilled nursing facilities are contacting cannot do daily radiation  transportation due to cost.  Likely to remain inpatient until the end of radiation, anticipated to be the end of next week.  At this point the plan is to continue full scope of care in an attempt to find care.  Continue to encourage discussion amongst family about DNR status and other options should there be a clinical decline.  I told him that I would follow-up in 2 to 3 days unless they have significant needs.  Referral has been made to the outpatient palliative medicine clinic in the cancer center to follow-up with the patient if she continues with treatment.  I provided emotional and general support through therapeutic listening, empathy, sharing of stories, and other techniques. I answered all questions and addressed all concerns to the best of my ability.  Review of Systems  Constitutional:  Positive for fatigue.  Respiratory:  Negative for cough and shortness of breath.        Left upper chest, shoulder, arm pain likely secondary to large lung mass  Gastrointestinal:  Negative for abdominal pain, nausea and vomiting.  Neurological:  Positive for weakness.    Objective:   Vital Signs:  BP 99/63 (BP Location: Right Arm)   Pulse 97   Temp (!) 97.4 F (36.3 C) (Oral)   Resp 16   Ht 5\' 5"  (1.651 m)   Wt 86.7 kg   SpO2 96%   BMI 31.81 kg/m   Physical Exam: Physical Exam Vitals and nursing note reviewed.  Constitutional:      General: She is sleeping. She is not in acute distress.    Appearance: She is  ill-appearing.  HENT:     Head: Normocephalic and atraumatic.  Cardiovascular:     Rate and Rhythm: Normal rate.  Pulmonary:     Effort: Pulmonary effort is normal. No respiratory distress.  Abdominal:     General: Bowel sounds are normal. There is no distension.     Palpations: Abdomen is soft.     Tenderness: There is no abdominal tenderness.  Skin:    General: Skin is warm and dry.  Neurological:     General: No focal deficit present.     Mental Status: She is  easily aroused.  Psychiatric:        Mood and Affect: Mood normal.        Behavior: Behavior normal.     Palliative Assessment/Data: 30-40%    Existing Vynca/ACP Documentation: None  Assessment & Plan:   Impression: Present on Admission:  Sepsis secondary to UTI (HCC)  (Resolved) UTI (urinary tract infection)  (Resolved) Hypomagnesemia  (Resolved) Transient hypotension  Left shoulder pain  Compression fracture of body of thoracic vertebra (HCC)  Nicotine dependence, cigarettes, uncomplicated  Folate deficiency  Acute deep vein thrombosis (DVT) of axillary vein of left upper extremity (HCC)  Toxic metabolic encephalopathy  Type 2 diabetes mellitus with stage 3a chronic kidney disease, without long-term current use of insulin (HCC)  Acute renal failure superimposed on stage 3a chronic kidney disease (HCC)  Squamous cell lung cancer, left (HCC)  72 year old female with left 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  Known cancer not curable but treatable.  Pending foundation 1 testing.  Options could include targeted immunotherapy, depending on how she does moving forward.  Has started radiation therapy with planned 12 treatments to the lung and 1 treatment to the brain.  Plan for outpatient oncology follow-up.  If she does not do well or has a clinical decline in the coming days would need to rediscuss CODE STATUS and comfort care; the patient's daughter seems realistic, her son is struggling a bit more at this.  Planned d/c to SNF after completing radiation (likely end of next week). In the meantime we will take it a day at a time and refer her to outpatient palliative medicine in the cancer center.  Long-term prognosis poor.  SUMMARY OF RECOMMENDATIONS   Remain full code for now Continue full scope of care Plan for radiation treatments Continued time for outcomes Further GOC discussions if the patient has declined in the coming days Outpatient palliative  care referral to the cancer center clinic completed Palliative medicine will follow-up in 2-3 days to see how patient is doing  Symptom Management:  Ultram 50 mg p.o. every 6 hours as needed Tylenol 1000 mg p.o. around-the-clock every 8 hours Gabapentin 100 mg twice daily Lidocaine 5% patch transdermal over 12 hours every 24 hours Further adjustments will be made as needed  Code Status: Full code  Prognosis: Unable to determine  Discharge Planning: To Be Determined  Discussed with: Patient, family, medical team, nursing team  Thank you for allowing [place] to participate in the care of Dafina Lashlee PMT will continue to support holistically.  Time Total: 40 min  Detailed review of medical records (labs, imaging, vital signs), medically appropriate exam, discussed with treatment team, counseling and education to patient, family, & staff, documenting clinical information, medication management, coordination of care  Wynne Dust, NP Palliative Medicine Team  Team Phone # [phone] (Nights/Weekends)  03/29/2021, 8:17 AM

## 2023-05-22 NOTE — Progress Notes (Signed)
Fingerstick glucose is 78 this morning. She denies h/a or nausea.  Ensure drink provided and encouraged to eat her breakfast.

## 2023-05-22 NOTE — Progress Notes (Signed)
SLP Cancellation Note  Patient Details Name: Madison Ho MRN: [id number] DOB: Nov 21, 1950   Cancelled treatment:       Reason Eval/Treat Not Completed: Other (comment);Fatigue/lethargy limiting ability to participate;Patient declined, no reason specified (SLP follow-up regarding patient's swallow function.  Patient had radiation and physical therapy today.  Sleeping upon entrance to room but easily awoken.  With head of bed elevation patient reported discomfort and wanted to be turned to the right.  Obtained 3 pillowcases to put on pillowcases for repositioning, daughter Madison Ho assisting to reposition pad and noted stool on patient's pad stating patient need to be cleaned.  Daughter reports patient not coughing "Much" with meals - or drinks - however SLP will modify her back to nectar liquids for use during meals and allow small sips of thin between meals given pt's overt aspiration of thin on MBS.  Daughter agreeable to plan for SLP to return next date as pt poorly positioned for any po and needs cleaned.   Rolena Infante, MS [facility] SLP Acute Rehab Services Office [phone]     Chales Abrahams 05/22/2023, 7:02 PM

## 2023-05-23 ENCOUNTER — Other Ambulatory Visit: Payer: Self-pay | Admitting: Nurse Practitioner

## 2023-05-23 ENCOUNTER — Encounter (HOSPITAL_COMMUNITY): Payer: Self-pay

## 2023-05-23 ENCOUNTER — Ambulatory Visit: Admit: 2023-05-23 | Payer: Medicare PPO

## 2023-05-23 DIAGNOSIS — E1122 Type 2 diabetes mellitus with diabetic chronic kidney disease: Secondary | ICD-10-CM | POA: Diagnosis not present

## 2023-05-23 DIAGNOSIS — I82A12 Acute embolism and thrombosis of left axillary vein: Secondary | ICD-10-CM | POA: Diagnosis not present

## 2023-05-23 DIAGNOSIS — C3492 Malignant neoplasm of unspecified part of left bronchus or lung: Secondary | ICD-10-CM | POA: Diagnosis not present

## 2023-05-23 DIAGNOSIS — M25512 Pain in left shoulder: Secondary | ICD-10-CM | POA: Diagnosis not present

## 2023-05-23 LAB — GLUCOSE, CAPILLARY
Glucose-Capillary: 68 mg/dL — ABNORMAL LOW (ref 70–99)
Glucose-Capillary: 127 mg/dL — ABNORMAL HIGH (ref 70–99)
Glucose-Capillary: 104 mg/dL — ABNORMAL HIGH (ref 70–99)
Glucose-Capillary: 106 mg/dL — ABNORMAL HIGH (ref 70–99)
Glucose-Capillary: 123 mg/dL — ABNORMAL HIGH (ref 70–99)

## 2023-05-23 NOTE — Plan of Care (Addendum)
BG 68 this morning, patient educated on importance of eating, OJ given with sugar and sugar is 127 now.  Problem: Education: Goal: Ability to describe self-care measures that may prevent or decrease complications (Diabetes Survival Skills Education) will improve Outcome: Progressing Goal: Individualized Educational Video(s) Outcome: Progressing   Problem: Coping: Goal: Ability to adjust to condition or change in health will improve Outcome: Progressing   Problem: Fluid Volume: Goal: Ability to maintain a balanced intake and output will improve Outcome: Progressing   Problem: Health Behavior/Discharge Planning: Goal: Ability to identify and utilize available resources and services will improve Outcome: Progressing Goal: Ability to manage health-related needs will improve Outcome: Progressing   Problem: Metabolic: Goal: Ability to maintain appropriate glucose levels will improve Outcome: Progressing   Problem: Nutritional: Goal: Maintenance of adequate nutrition will improve Outcome: Progressing Goal: Progress toward achieving an optimal weight will improve Outcome: Progressing   Problem: Skin Integrity: Goal: Risk for impaired skin integrity will decrease Outcome: Progressing   Problem: Tissue Perfusion: Goal: Adequacy of tissue perfusion will improve Outcome: Progressing   Problem: Education: Goal: Knowledge of General Education information will improve Description: Including pain rating scale, medication(s)/side effects and non-pharmacologic comfort measures Outcome: Progressing   Problem: Health Behavior/Discharge Planning: Goal: Ability to manage health-related needs will improve Outcome: Progressing   Problem: Clinical Measurements: Goal: Ability to maintain clinical measurements within normal limits will improve Outcome: Progressing Goal: Will remain free from infection Outcome: Progressing Goal: Diagnostic test results will improve Outcome:  Progressing Goal: Respiratory complications will improve Outcome: Progressing Goal: Cardiovascular complication will be avoided Outcome: Progressing   Problem: Activity: Goal: Risk for activity intolerance will decrease Outcome: Progressing   Problem: Nutrition: Goal: Adequate nutrition will be maintained Outcome: Progressing   Problem: Coping: Goal: Level of anxiety will decrease Outcome: Progressing   Problem: Elimination: Goal: Will not experience complications related to bowel motility Outcome: Progressing Goal: Will not experience complications related to urinary retention Outcome: Progressing   Problem: Pain Managment: Goal: General experience of comfort will improve Outcome: Progressing   Problem: Safety: Goal: Ability to remain free from injury will improve Outcome: Progressing   Problem: Skin Integrity: Goal: Risk for impaired skin integrity will decrease Outcome: Progressing

## 2023-05-23 NOTE — Progress Notes (Signed)
PROGRESS NOTE    Madison Ho  ION:[id number] DOB: [birth year] DOA: 05/07/2023 PCP: Ignatius Specking, MD   Brief Narrative:  72 year old female with past medical history of hypertension, hyperlipidemia, diabetes mellitus type 2, COPD and neuropathy who presented to [facility] emergency department with left arm and shoulder pain 40 pound weight loss and failure to thrive.   Patient was admitted to hospital service. Workup included CT imaging which revealed a large left lung mass concerning for primary lung malignancy.  Biopsy of left upper lobe mass on 10/9 was consistent with squamous cell lung cancer showing local invasion, right adrenal metastases and a solitary 8 mm intracranial metastasis on MRI.  Dr. Cherly Hensen with Medical Oncology was consulted as well as radiation oncology.  Patient was transferred to [facility] for initiation of palliative XRT, 10 fractions of palliative chest radiation.  Palliative care is also been consulted for their assistance.     Hospital course has had multiple complications including an episode of observed aspiration on 10/14.  Patient was briefly made n.p.o. and speech therapy evaluation was carried out followed by modified barium swallow on 10/16.  An appropriate dysphagia 2 diet was then ordered.  Additionally patient developed a left upper extremity DVT identified on ultrasound on 10/17 for which Lovenox was initiated.   Considering associated debility and weakness patient is anticipated to go to a skilled nursing facility upon discharge.   Assessment & Plan:   Active Problems:   Squamous cell lung cancer, left (HCC)   Acute deep vein thrombosis (DVT) of axillary vein of left upper extremity (HCC)   Left shoulder pain   Dysphagia   Toxic metabolic encephalopathy   Acute renal failure superimposed on stage 3a chronic kidney disease (HCC)   Type 2 diabetes mellitus with stage 3a chronic kidney disease, without long-term current use of insulin  (HCC)   Folate deficiency   Goals of care, counseling/discussion   Sepsis secondary to UTI (HCC)   Nicotine dependence, cigarettes, uncomplicated   Compression fracture of body of thoracic vertebra (HCC)   Mediastinal mass   Mass of thoracic structure   DNR (do not resuscitate) discussion   Non-small cell lung cancer metastatic to adrenal gland (HCC)   Metastasis to brain (HCC)   Normocytic anemia   Low serum vitamin B12   Pressure injury of skin  Squamous cell lung cancer, left (HCC) Identification of large left lung mass upon this admission determined to be squamous cell lung cancer Masses substantial in size measuring 13 x 15 cm x 17 cm with encasement of the left main pulmonary artery with mass effect as well as abutting the distal trachea, encasing the left mainstem bronchus, occluding the left upper lobe bronchus and abutting the thoracic aorta. As a result patient is suffering from substantial left upper extremity pain and edema with concurrent development of DVT. Currently undergoing radiation therapy.  Patient has received 1 of 10 cycles thus far.  She needs 10 cycles, to be completed next week.  Morphine is ordered as needed every 2 hours.  She should get that at least an hour before going for radiation every day.   Acute deep vein thrombosis (DVT) of axillary vein of left upper extremity (HCC) Has edema.  Continue Lovenox.  Advised to keep it elevated.   Left shoulder pain Pain is felt to be completely related to ongoing lung malignancy  Patient is currently on Lidoderm patches, Tylenol and Ultram.  Morphine as needed.   Dysphagia Patient  experienced substantial lethargy and confusion on 10/14 with observed aspiration thought to be secondary to oxycodone Patient did receive a speech therapy evaluation and looks like patient was eventually advanced to consistent carbohydrate diet on 05/19/2023.   Toxic metabolic encephalopathy Substantial lethargy noted on 10/14, likely  secondary to polypharmacy including repeated doses of oxycodone.  She is fully alert and oriented now.   Acute renal failure superimposed on stage 3a chronic kidney disease (HCC) Creatinine currently near baseline   Type 2 diabetes mellitus with stage 3a chronic kidney disease, without long-term current use of insulin (HCC) Good glycemic control Accu-Cheks before every meal and nightly with sliding scale insulin.   Folate deficiency Daily folic acid supplementation.   Goals of care, counseling/discussion Poor prognosis due to extensive progression of left lung squamous cell cancer Palliative care has been assisting in having goals of care discussions with family At this time patient is full code and is pursuing all available modalities of care.   Nicotine dependence, cigarettes, uncomplicated Counseling on cessation daily.   Sepsis secondary to UTI [facility]) Resolved, antibiotics complete.  Disposition: Patient needs to go to SNF.  Daughter has chosen either [place] or [facility].  None of those facilities are able to provide transportation back and forth for radiation on daily basis due to insurance issues.  Unfortunately, family is not able to provide transportation either.  For that reason, patient will need to stay in the hospital until completion of the radiation therapy (which will be at the end of the next week) and then discharged to SNF.  Discussed with TOC and daughter.  DVT prophylaxis: SCDs Start: 05/08/23 0552 Lovenox   Code Status: Full Code  Family Communication: Daughter present at bedside.  Plan of care discussed with patient in length and he/she verbalized understanding and agreed with it.  Status is: Inpatient Remains inpatient appropriate because: Receiving radiation therapy on daily basis, cannot be discharged until completed radiation therapy by next week.  Details above.   Estimated body mass index is 31.7 kg/m as calculated from the following:   Height as of  this encounter: 5\' 5"  (1.651 m).   Weight as of this encounter: 86.4 kg.  Pressure Injury 05/15/23 Sacrum Stage 1 -  Intact skin with non-blanchable redness of a localized area usually over a bony prominence. blanchable red area to sacrum (Active)  05/15/23 2210  Location: Sacrum  Location Orientation:   Staging: Stage 1 -  Intact skin with non-blanchable redness of a localized area usually over a bony prominence.  Wound Description (Comments): blanchable red area to sacrum  Present on Admission: Yes  Dressing Type Foam - Lift dressing to assess site every shift 05/23/23 0444   Nutritional Assessment: Body mass index is 31.7 kg/m.Marland Kitchen Seen by dietician.  I agree with the assessment and plan as outlined below: Nutrition Status:        . Skin Assessment: I have examined the patient's skin and I agree with the wound assessment as performed by the wound care RN as outlined below: Pressure Injury 05/15/23 Sacrum Stage 1 -  Intact skin with non-blanchable redness of a localized area usually over a bony prominence. blanchable red area to sacrum (Active)  05/15/23 2210  Location: Sacrum  Location Orientation:   Staging: Stage 1 -  Intact skin with non-blanchable redness of a localized area usually over a bony prominence.  Wound Description (Comments): blanchable red area to sacrum  Present on Admission: Yes  Dressing Type Foam - Lift dressing to  assess site every shift 05/23/23 0444    Consultants:  Palliative care and radiation therapy  Procedures:  As above  Antimicrobials:  Anti-infectives (From admission, onward)    Start     Dose/Rate Route Frequency Ordered Stop   05/13/23 2015  cefTRIAXone (ROCEPHIN) 2 g in sodium chloride 0.9 % 100 mL IVPB  Status:  Discontinued        2 g 200 mL/hr over 30 Minutes Intravenous Every 24 hours 05/13/23 1919 05/14/23 0742   05/09/23 1000  vancomycin (VANCOCIN) IVPB 1000 mg/200 mL premix  Status:  Discontinued        1,000 mg 200 mL/hr over  60 Minutes Intravenous Every 24 hours 05/08/23 0558 05/09/23 1406   05/08/23 1230  cefTRIAXone (ROCEPHIN) 2 g in sodium chloride 0.9 % 100 mL IVPB        2 g 200 mL/hr over 30 Minutes Intravenous Every 24 hours 05/08/23 1226 05/10/23 1307   05/08/23 0045  aztreonam (AZACTAM) 2 g in sodium chloride 0.9 % 100 mL IVPB  Status:  Discontinued        2 g 200 mL/hr over 30 Minutes Intravenous  Once 05/08/23 0034 05/08/23 0037   05/08/23 0045  metroNIDAZOLE (FLAGYL) IVPB 500 mg        500 mg 100 mL/hr over 60 Minutes Intravenous  Once 05/08/23 0034 05/08/23 0435   05/08/23 0045  vancomycin (VANCOCIN) IVPB 1000 mg/200 mL premix  Status:  Discontinued        1,000 mg 200 mL/hr over 60 Minutes Intravenous  Once 05/08/23 0034 05/08/23 0036   05/08/23 0045  vancomycin (VANCOREADY) IVPB 1500 mg/300 mL        1,500 mg 150 mL/hr over 120 Minutes Intravenous  Once 05/08/23 0037 05/08/23 0647   05/08/23 0045  ceFEPIme (MAXIPIME) 2 g in sodium chloride 0.9 % 100 mL IVPB        2 g 200 mL/hr over 30 Minutes Intravenous  Once 05/08/23 0037 05/08/23 0334         Subjective: Patient seen and examined.  No complaints today.  Daughter at the bedside.  Objective: Vitals:   05/22/23 2100 05/23/23 0500 05/23/23 0620 05/23/23 0808  BP: 119/60  (!) 94/57   Pulse: (!) 106  98   Resp: 16  16   Temp: 98 F (36.7 C)  98.1 F (36.7 C)   TempSrc: Oral  Oral   SpO2: 93%  (!) 89% 92%  Weight:  86.4 kg    Height:        Intake/Output Summary (Last 24 hours) at 05/23/2023 1141 Last data filed at 05/23/2023 0600 Gross per 24 hour  Intake 250 ml  Output 600 ml  Net -350 ml   Filed Weights   05/21/23 0553 05/22/23 0428 05/23/23 0500  Weight: 86.5 kg 86.7 kg 86.4 kg    Examination:  General exam: Appears calm and comfortable, obese Respiratory system: Clear to auscultation. Respiratory effort normal. Cardiovascular system: S1 & S2 heard, RRR. No JVD, murmurs, rubs, gallops or clicks.  Left upper  extremity and right lower extremity edema. Gastrointestinal system: Abdomen is nondistended, soft and nontender. No organomegaly or masses felt. Normal bowel sounds heard. Central nervous system: Alert and oriented. No focal neurological deficits. Extremities: Symmetric 5 x 5 power. Skin: No rashes, lesions or ulcers.  Psychiatry: Judgement and insight appear normal. Mood & affect appropriate.    Data Reviewed: I have personally reviewed following labs and imaging studies  CBC: Recent Labs  Lab 05/17/23 0538 05/18/23 0556 05/19/23 1416  WBC 11.4* 11.9* 11.4*  NEUTROABS 8.2* 8.4* 8.9*  HGB 10.9* 11.3* 11.3*  HCT 36.2 37.9 38.0  MCV 98.6 99.5 100.3*  PLT 412* 441* 397   Basic Metabolic Panel: Recent Labs  Lab 05/17/23 0538 05/18/23 0556 05/18/23 1204 05/19/23 1416  NA 135 133* 135 132*  K 4.4 5.4* 5.1 5.5*  CL 93* 91* 94* 91*  CO2 30 32 30 30  GLUCOSE 120* 104* 120* 106*  BUN 24* 29* 32* 37*  CREATININE 0.88 1.06* 1.13* 1.07*  CALCIUM 9.0 9.1 9.1 9.0  PHOS 4.5 5.5*  --   --    GFR: Estimated Creatinine Clearance: 50.9 mL/min (A) (by C-G formula based on SCr of 1.07 mg/dL (H)). Liver Function Tests: Recent Labs  Lab 05/17/23 0538 05/18/23 0556 05/19/23 1416  AST  --   --  22  ALT  --   --  14  ALKPHOS  --   --  52  BILITOT  --   --  0.4  PROT  --   --  6.2*  ALBUMIN 2.2* 2.4* 2.3*   No results for input(s): "LIPASE", "AMYLASE" in the last 168 hours. No results for input(s): "AMMONIA" in the last 168 hours. Coagulation Profile: Recent Labs  Lab 05/19/23 1416  INR 1.1   Cardiac Enzymes: No results for input(s): "CKTOTAL", "CKMB", "CKMBINDEX", "TROPONINI" in the last 168 hours. BNP (last 3 results) No results for input(s): "PROBNP" in the last 8760 hours. HbA1C: No results for input(s): "HGBA1C" in the last 72 hours. CBG: Recent Labs  Lab 05/22/23 1116 05/22/23 1623 05/22/23 2105 05/23/23 0726 05/23/23 0830  GLUCAP 199* 122* 88 68* 127*   Lipid  Profile: No results for input(s): "CHOL", "HDL", "LDLCALC", "TRIG", "CHOLHDL", "LDLDIRECT" in the last 72 hours. Thyroid Function Tests: No results for input(s): "TSH", "T4TOTAL", "FREET4", "T3FREE", "THYROIDAB" in the last 72 hours. Anemia Panel: No results for input(s): "VITAMINB12", "FOLATE", "FERRITIN", "TIBC", "IRON", "RETICCTPCT" in the last 72 hours. Sepsis Labs: No results for input(s): "PROCALCITON", "LATICACIDVEN" in the last 168 hours.  No results found for this or any previous visit (from the past 240 hour(s)).   Radiology Studies: No results found.  Scheduled Meds:  acetaminophen  1,000 mg Oral Q8H   vitamin B-12  1,000 mcg Oral Daily   enoxaparin (LOVENOX) injection  80 mg Subcutaneous Q12H   feeding supplement  237 mL Oral BID BM   folic acid  1 mg Oral Daily   gabapentin  100 mg Oral BID   insulin aspart  0-9 Units Subcutaneous TID AC & HS   lidocaine  2 patch Transdermal Q24H   mouth rinse  15 mL Mouth Rinse 4 times per day   polyethylene glycol  17 g Oral Daily   senna-docusate  1 tablet Oral QHS   umeclidinium bromide  1 puff Inhalation Daily   Continuous Infusions:   LOS: 15 days   Hughie Closs, MD [facility]  05/23/2023, 11:41 AM   *Please note that this is a verbal dictation therefore any spelling or grammatical errors are due to the "Dragon [employer] One" system interpretation.  Please page via Amion and do not message via secure chat for urgent patient care matters. Secure chat can be used for non urgent patient care matters.  How to contact the [facility] Attending or Consulting provider 7A - 7P or covering provider during after hours 7P -7A, for this patient?  Check the care team in [facility]  and look for a) attending/consulting [facility] provider listed and b) the [facility] team listed. Page or secure chat 7A-7P. Log into [email or web address] and use [place]'s universal password to access. If you do not have the password, please contact the hospital operator. Locate  the [facility] provider you are looking for under [facility] and page to a number that you can be directly reached. If you still have difficulty reaching the provider, please page the [facility] (Director on Call) for the Hospitalists listed on amion for assistance.

## 2023-05-24 ENCOUNTER — Other Ambulatory Visit: Payer: Self-pay

## 2023-05-24 ENCOUNTER — Ambulatory Visit
Admit: 2023-05-24 | Discharge: 2023-05-24 | Disposition: A | Discharge status: Home / Self Care | Payer: Medicare PPO | Attending: Radiation Oncology | Admitting: Radiation Oncology

## 2023-05-24 DIAGNOSIS — I82A12 Acute embolism and thrombosis of left axillary vein: Secondary | ICD-10-CM | POA: Diagnosis not present

## 2023-05-24 DIAGNOSIS — G928 Other toxic encephalopathy: Secondary | ICD-10-CM | POA: Diagnosis not present

## 2023-05-24 DIAGNOSIS — C3492 Malignant neoplasm of unspecified part of left bronchus or lung: Secondary | ICD-10-CM | POA: Diagnosis not present

## 2023-05-24 DIAGNOSIS — M25512 Pain in left shoulder: Secondary | ICD-10-CM | POA: Diagnosis not present

## 2023-05-24 LAB — GLUCOSE, CAPILLARY
Glucose-Capillary: 136 mg/dL — ABNORMAL HIGH (ref 70–99)
Glucose-Capillary: 88 mg/dL (ref 70–99)
Glucose-Capillary: 96 mg/dL (ref 70–99)
Glucose-Capillary: 121 mg/dL — ABNORMAL HIGH (ref 70–99)

## 2023-05-24 LAB — CBC WITH DIFFERENTIAL/PLATELET
Abs Immature Granulocytes: 0.17 10*3/uL — ABNORMAL HIGH (ref 0.00–0.07)
Basophils Absolute: 0.1 10*3/uL (ref 0.0–0.1)
Basophils Relative: 1 %
Eosinophils Absolute: 0.1 10*3/uL (ref 0.0–0.5)
Eosinophils Relative: 1 %
HCT: 33.8 % — ABNORMAL LOW (ref 36.0–46.0)
Hemoglobin: 10.3 g/dL — ABNORMAL LOW (ref 12.0–15.0)
Immature Granulocytes: 2 %
Lymphocytes Relative: 13 %
Lymphs Abs: 1.1 10*3/uL (ref 0.7–4.0)
MCH: 30.6 pg (ref 26.0–34.0)
MCHC: 30.5 g/dL (ref 30.0–36.0)
MCV: 100.3 fL — ABNORMAL HIGH (ref 80.0–100.0)
Monocytes Absolute: 0.7 10*3/uL (ref 0.1–1.0)
Monocytes Relative: 8 %
Neutro Abs: 6.4 10*3/uL (ref 1.7–7.7)
Neutrophils Relative %: 75 %
Platelets: 392 10*3/uL (ref 150–400)
RBC: 3.37 MIL/uL — ABNORMAL LOW (ref 3.87–5.11)
RDW: 20.4 % — ABNORMAL HIGH (ref 11.5–15.5)
WBC: 8.5 10*3/uL (ref 4.0–10.5)
nRBC: 0 % (ref 0.0–0.2)

## 2023-05-24 LAB — RAD ONC ARIA SESSION SUMMARY
Course Elapsed Days: 7
Plan Fractions Treated to Date: 1
Plan Prescribed Dose Per Fraction: 3 Gy
Plan Total Fractions Prescribed: 8
Plan Total Prescribed Dose: 24 Gy
Reference Point Dosage Given to Date: 9 Gy
Reference Point Session Dosage Given: 3 Gy
Session Number: 3

## 2023-05-24 LAB — MAGNESIUM: Magnesium: 2 mg/dL (ref 1.7–2.4)

## 2023-05-24 LAB — BASIC METABOLIC PANEL
Anion gap: 12 (ref 5–15)
BUN: 39 mg/dL — ABNORMAL HIGH (ref 8–23)
CO2: 29 mmol/L (ref 22–32)
Calcium: 9.2 mg/dL (ref 8.9–10.3)
Chloride: 93 mmol/L — ABNORMAL LOW (ref 98–111)
Creatinine, Ser: 1.04 mg/dL — ABNORMAL HIGH (ref 0.44–1.00)
GFR, Estimated: 57 mL/min — ABNORMAL LOW (ref >60)
Glucose, Bld: 121 mg/dL — ABNORMAL HIGH (ref 70–99)
Potassium: 5.3 mmol/L — ABNORMAL HIGH (ref 3.5–5.1)
Sodium: 134 mmol/L — ABNORMAL LOW (ref 135–145)

## 2023-05-24 MED ORDER — SODIUM ZIRCONIUM CYCLOSILICATE 10 G PO PACK
10.0000 g | PACK | Freq: Every day | ORAL | Status: AC
  Administered 2023-05-24 – 2023-05-25 (×2): 10 g via ORAL
  Filled 2023-05-24 (×2): qty 1

## 2023-05-24 MED ORDER — FOOD THICKENER (SIMPLYTHICK)
1.0000 | ORAL | Status: DC | PRN
  Filled 2023-05-24 (×3): qty 1

## 2023-05-24 NOTE — Plan of Care (Signed)
  Problem: Education: Goal: Ability to describe self-care measures that may prevent or decrease complications (Diabetes Survival Skills Education) will improve Outcome: Progressing Goal: Individualized Educational Video(s) Outcome: Progressing   Problem: Coping: Goal: Ability to adjust to condition or change in health will improve Outcome: Progressing   Problem: Fluid Volume: Goal: Ability to maintain a balanced intake and output will improve Outcome: Progressing   Problem: Health Behavior/Discharge Planning: Goal: Ability to identify and utilize available resources and services will improve Outcome: Progressing Goal: Ability to manage health-related needs will improve Outcome: Progressing   Problem: Metabolic: Goal: Ability to maintain appropriate glucose levels will improve Outcome: Progressing   Problem: Nutritional: Goal: Maintenance of adequate nutrition will improve Outcome: Progressing Goal: Progress toward achieving an optimal weight will improve Outcome: Progressing   Problem: Skin Integrity: Goal: Risk for impaired skin integrity will decrease Outcome: Progressing   Problem: Tissue Perfusion: Goal: Adequacy of tissue perfusion will improve Outcome: Progressing   Problem: Education: Goal: Knowledge of General Education information will improve Description: Including pain rating scale, medication(s)/side effects and non-pharmacologic comfort measures Outcome: Progressing   Problem: Health Behavior/Discharge Planning: Goal: Ability to manage health-related needs will improve Outcome: Progressing   Problem: Clinical Measurements: Goal: Ability to maintain clinical measurements within normal limits will improve Outcome: Progressing Goal: Will remain free from infection Outcome: Progressing Goal: Diagnostic test results will improve Outcome: Progressing Goal: Respiratory complications will improve Outcome: Progressing Goal: Cardiovascular complication will  be avoided Outcome: Progressing   Problem: Activity: Goal: Risk for activity intolerance will decrease Outcome: Progressing   Problem: Nutrition: Goal: Adequate nutrition will be maintained Outcome: Progressing   Problem: Coping: Goal: Level of anxiety will decrease Outcome: Progressing   Problem: Elimination: Goal: Will not experience complications related to bowel motility Outcome: Progressing Goal: Will not experience complications related to urinary retention Outcome: Progressing   Problem: Pain Managment: Goal: General experience of comfort will improve Outcome: Progressing   Problem: Skin Integrity: Goal: Risk for impaired skin integrity will decrease Outcome: Progressing

## 2023-05-24 NOTE — Progress Notes (Signed)
PROGRESS NOTE    Madison Ho  WUJ:[id number] DOB: [birth year] DOA: 05/07/2023 PCP: Ignatius Specking, MD   Brief Narrative:  72 year old female with past medical history of hypertension, hyperlipidemia, diabetes mellitus type 2, COPD and neuropathy who presented to [facility] emergency department with left arm and shoulder pain 40 pound weight loss and failure to thrive.   Patient was admitted to hospital service. Workup included CT imaging which revealed a large left lung mass concerning for primary lung malignancy.  Biopsy of left upper lobe mass on 10/9 was consistent with squamous cell lung cancer showing local invasion, right adrenal metastases and a solitary 8 mm intracranial metastasis on MRI.  Dr. Cherly Hensen with Medical Oncology was consulted as well as radiation oncology.  Patient was transferred to [facility] for initiation of palliative XRT, 10 fractions of palliative chest radiation.  Palliative care is also been consulted for their assistance.     Hospital course has had multiple complications including an episode of observed aspiration on 10/14.  Patient was briefly made n.p.o. and speech therapy evaluation was carried out followed by modified barium swallow on 10/16.  An appropriate dysphagia 2 diet was then ordered.  Additionally patient developed a left upper extremity DVT identified on ultrasound on 10/17 for which Lovenox was initiated.   Considering associated debility and weakness patient is anticipated to go to a skilled nursing facility upon discharge.   Assessment & Plan:   Active Problems:   Squamous cell lung cancer, left (HCC)   Acute deep vein thrombosis (DVT) of axillary vein of left upper extremity (HCC)   Left shoulder pain   Dysphagia   Toxic metabolic encephalopathy   Acute renal failure superimposed on stage 3a chronic kidney disease (HCC)   Type 2 diabetes mellitus with stage 3a chronic kidney disease, without long-term current use of insulin  (HCC)   Folate deficiency   Goals of care, counseling/discussion   Sepsis secondary to UTI (HCC)   Nicotine dependence, cigarettes, uncomplicated   Compression fracture of body of thoracic vertebra (HCC)   Mediastinal mass   Mass of thoracic structure   DNR (do not resuscitate) discussion   Non-small cell lung cancer metastatic to adrenal gland (HCC)   Metastasis to brain (HCC)   Normocytic anemia   Low serum vitamin B12   Pressure injury of skin  Squamous cell lung cancer, left (HCC) Identification of large left lung mass upon this admission determined to be squamous cell lung cancer Masses substantial in size measuring 13 x 15 cm x 17 cm with encasement of the left main pulmonary artery with mass effect as well as abutting the distal trachea, encasing the left mainstem bronchus, occluding the left upper lobe bronchus and abutting the thoracic aorta. As a result patient is suffering from substantial left upper extremity pain and edema with concurrent development of DVT. Currently undergoing radiation therapy.  Patient has received 1 of 10 cycles thus far.  She needs 10 cycles, to be completed next week.  Morphine is ordered as needed every 2 hours.  She should get that at least an hour before going for radiation every day.   Acute deep vein thrombosis (DVT) of axillary vein of left upper extremity (HCC) Has edema.  Continue Lovenox.  Advised to keep it elevated.  Oncology has cleared her to be placed on Eliquis at the time of discharge.   Left shoulder pain Pain is felt to be completely related to ongoing lung malignancy  Patient is  currently on Lidoderm patches, Tylenol and Ultram.  Morphine as needed.   Dysphagia Patient experienced substantial lethargy and confusion on 10/14 with observed aspiration thought to be secondary to oxycodone Patient did receive a speech therapy evaluation and looks like patient was eventually advanced to consistent carbohydrate diet on 05/19/2023.    Toxic metabolic encephalopathy Substantial lethargy noted on 10/14, likely secondary to polypharmacy including repeated doses of oxycodone.  She is fully alert and oriented now.   Acute renal failure superimposed on stage 3a chronic kidney disease (HCC) Creatinine currently near baseline   Type 2 diabetes mellitus with stage 3a chronic kidney disease, without long-term current use of insulin (HCC) Good glycemic control Accu-Cheks before every meal and nightly with sliding scale insulin.   Folate deficiency Daily folic acid supplementation.   Goals of care, counseling/discussion Poor prognosis due to extensive progression of left lung squamous cell cancer Palliative care has been assisting in having goals of care discussions with family At this time patient is full code and is pursuing all available modalities of care.   Nicotine dependence, cigarettes, uncomplicated Counseling on cessation daily.   Sepsis secondary to UTI [facility]) Resolved, antibiotics complete.  Disposition: Patient needs to go to SNF.  Daughter has chosen either [place] or [facility].  None of those facilities are able to provide transportation back and forth for radiation on daily basis due to insurance issues.  Unfortunately, family is not able to provide transportation either.  For that reason, patient will need to stay in the hospital until completion of the radiation therapy (which will be at the end of the next week) and then discharged to SNF.  Discussed with TOC and daughter.  Hyperkalemia: 5.3.  Lokelma ordered.  Repeat BMP in the morning.  DVT prophylaxis: SCDs Start: 05/08/23 0552 Lovenox   Code Status: Full Code  Family Communication: Daughter present at bedside.  Plan of care discussed with patient in length and he/she verbalized understanding and agreed with it.  Status is: Inpatient Remains inpatient appropriate because: Receiving radiation therapy on daily basis, cannot be discharged until  completed radiation therapy by next week.  Details above.   Estimated body mass index is 31.7 kg/m as calculated from the following:   Height as of this encounter: 5\' 5"  (1.651 m).   Weight as of this encounter: 86.4 kg.  Pressure Injury 05/15/23 Sacrum Stage 1 -  Intact skin with non-blanchable redness of a localized area usually over a bony prominence. blanchable red area to sacrum (Active)  05/15/23 2210  Location: Sacrum  Location Orientation:   Staging: Stage 1 -  Intact skin with non-blanchable redness of a localized area usually over a bony prominence.  Wound Description (Comments): blanchable red area to sacrum  Present on Admission: Yes  Dressing Type None 05/23/23 2035   Nutritional Assessment: Body mass index is 31.7 kg/m.Marland Kitchen Seen by dietician.  I agree with the assessment and plan as outlined below: Nutrition Status:        . Skin Assessment: I have examined the patient's skin and I agree with the wound assessment as performed by the wound care RN as outlined below: Pressure Injury 05/15/23 Sacrum Stage 1 -  Intact skin with non-blanchable redness of a localized area usually over a bony prominence. blanchable red area to sacrum (Active)  05/15/23 2210  Location: Sacrum  Location Orientation:   Staging: Stage 1 -  Intact skin with non-blanchable redness of a localized area usually over a bony prominence.  Wound Description (  Comments): blanchable red area to sacrum  Present on Admission: Yes  Dressing Type None 05/23/23 2035    Consultants:  Palliative care and radiation therapy  Procedures:  As above  Antimicrobials:  Anti-infectives (From admission, onward)    Start     Dose/Rate Route Frequency Ordered Stop   05/13/23 2015  cefTRIAXone (ROCEPHIN) 2 g in sodium chloride 0.9 % 100 mL IVPB  Status:  Discontinued        2 g 200 mL/hr over 30 Minutes Intravenous Every 24 hours 05/13/23 1919 05/14/23 0742   05/09/23 1000  vancomycin (VANCOCIN) IVPB 1000 mg/200  mL premix  Status:  Discontinued        1,000 mg 200 mL/hr over 60 Minutes Intravenous Every 24 hours 05/08/23 0558 05/09/23 1406   05/08/23 1230  cefTRIAXone (ROCEPHIN) 2 g in sodium chloride 0.9 % 100 mL IVPB        2 g 200 mL/hr over 30 Minutes Intravenous Every 24 hours 05/08/23 1226 05/10/23 1307   05/08/23 0045  aztreonam (AZACTAM) 2 g in sodium chloride 0.9 % 100 mL IVPB  Status:  Discontinued        2 g 200 mL/hr over 30 Minutes Intravenous  Once 05/08/23 0034 05/08/23 0037   05/08/23 0045  metroNIDAZOLE (FLAGYL) IVPB 500 mg        500 mg 100 mL/hr over 60 Minutes Intravenous  Once 05/08/23 0034 05/08/23 0435   05/08/23 0045  vancomycin (VANCOCIN) IVPB 1000 mg/200 mL premix  Status:  Discontinued        1,000 mg 200 mL/hr over 60 Minutes Intravenous  Once 05/08/23 0034 05/08/23 0036   05/08/23 0045  vancomycin (VANCOREADY) IVPB 1500 mg/300 mL        1,500 mg 150 mL/hr over 120 Minutes Intravenous  Once 05/08/23 0037 05/08/23 0647   05/08/23 0045  ceFEPIme (MAXIPIME) 2 g in sodium chloride 0.9 % 100 mL IVPB        2 g 200 mL/hr over 30 Minutes Intravenous  Once 05/08/23 0037 05/08/23 0334         Subjective: Patient's and examined.  Daughter sleeping in the room.  Patient has no new complaint.  Objective: Vitals:   05/23/23 0808 05/23/23 1300 05/23/23 2035 05/24/23 0733  BP:  107/61 101/89   Pulse:  98 98   Resp:  18 16   Temp:  98.4 F (36.9 C) 98.2 F (36.8 C)   TempSrc:  Oral Oral   SpO2: 92% (!) 82% (!) 87% 90%  Weight:      Height:        Intake/Output Summary (Last 24 hours) at 05/24/2023 1028 Last data filed at 05/23/2023 1745 Gross per 24 hour  Intake --  Output 300 ml  Net -300 ml   Filed Weights   05/21/23 0553 05/22/23 0428 05/23/23 0500  Weight: 86.5 kg 86.7 kg 86.4 kg    Examination:  General exam: Appears calm and comfortable  Respiratory system: Clear to auscultation. Respiratory effort normal. Cardiovascular system: S1 & S2 heard,  RRR. No JVD, murmurs, rubs, gallops or clicks.  Upper extremity edema is improving. Gastrointestinal system: Abdomen is nondistended, soft and nontender. No organomegaly or masses felt. Normal bowel sounds heard. Central nervous system: Alert and oriented. No focal neurological deficits. Extremities: Symmetric 5 x 5 power. Skin: No rashes, lesions or ulcers.  Psychiatry: Judgement and insight appear normal. Mood & affect appropriate.   Data Reviewed: I have personally reviewed following labs and  imaging studies  CBC: Recent Labs  Lab 05/18/23 0556 05/19/23 1416 05/24/23 0928  WBC 11.9* 11.4* 8.5  NEUTROABS 8.4* 8.9* 6.4  HGB 11.3* 11.3* 10.3*  HCT 37.9 38.0 33.8*  MCV 99.5 100.3* 100.3*  PLT 441* 397 392   Basic Metabolic Panel: Recent Labs  Lab 05/18/23 0556 05/18/23 1204 05/19/23 1416 05/24/23 0928  NA 133* 135 132* 134*  K 5.4* 5.1 5.5* 5.3*  CL 91* 94* 91* 93*  CO2 32 30 30 29   GLUCOSE 104* 120* 106* 121*  BUN 29* 32* 37* 39*  CREATININE 1.06* 1.13* 1.07* 1.04*  CALCIUM 9.1 9.1 9.0 9.2  MG  --   --   --  2.0  PHOS 5.5*  --   --   --    GFR: Estimated Creatinine Clearance: 52.3 mL/min (A) (by C-G formula based on SCr of 1.04 mg/dL (H)). Liver Function Tests: Recent Labs  Lab 05/18/23 0556 05/19/23 1416  AST  --  22  ALT  --  14  ALKPHOS  --  52  BILITOT  --  0.4  PROT  --  6.2*  ALBUMIN 2.4* 2.3*   No results for input(s): "LIPASE", "AMYLASE" in the last 168 hours. No results for input(s): "AMMONIA" in the last 168 hours. Coagulation Profile: Recent Labs  Lab 05/19/23 1416  INR 1.1   Cardiac Enzymes: No results for input(s): "CKTOTAL", "CKMB", "CKMBINDEX", "TROPONINI" in the last 168 hours. BNP (last 3 results) No results for input(s): "PROBNP" in the last 8760 hours. HbA1C: No results for input(s): "HGBA1C" in the last 72 hours. CBG: Recent Labs  Lab 05/23/23 0830 05/23/23 1145 05/23/23 1626 05/23/23 2145 05/24/23 0721  GLUCAP 127* 104*  106* 123* 136*   Lipid Profile: No results for input(s): "CHOL", "HDL", "LDLCALC", "TRIG", "CHOLHDL", "LDLDIRECT" in the last 72 hours. Thyroid Function Tests: No results for input(s): "TSH", "T4TOTAL", "FREET4", "T3FREE", "THYROIDAB" in the last 72 hours. Anemia Panel: No results for input(s): "VITAMINB12", "FOLATE", "FERRITIN", "TIBC", "IRON", "RETICCTPCT" in the last 72 hours. Sepsis Labs: No results for input(s): "PROCALCITON", "LATICACIDVEN" in the last 168 hours.  No results found for this or any previous visit (from the past 240 hour(s)).   Radiology Studies: No results found.  Scheduled Meds:  acetaminophen  1,000 mg Oral Q8H   vitamin B-12  1,000 mcg Oral Daily   enoxaparin (LOVENOX) injection  80 mg Subcutaneous Q12H   feeding supplement  237 mL Oral BID BM   folic acid  1 mg Oral Daily   gabapentin  100 mg Oral BID   insulin aspart  0-9 Units Subcutaneous TID AC & HS   lidocaine  2 patch Transdermal Q24H   mouth rinse  15 mL Mouth Rinse 4 times per day   polyethylene glycol  17 g Oral Daily   senna-docusate  1 tablet Oral QHS   sodium zirconium cyclosilicate  10 g Oral Daily   umeclidinium bromide  1 puff Inhalation Daily   Continuous Infusions:   LOS: 16 days   Hughie Closs, MD [facility]  05/24/2023, 10:28 AM   *Please note that this is a verbal dictation therefore any spelling or grammatical errors are due to the "[employer]agon [employer] One" system interpretation.  Please page via Amion and do not message via secure chat for urgent patient care matters. Secure chat can be used for non urgent patient care matters.  How to contact the [facility] Attending or Consulting provider 7A - 7P or covering provider during after  hours 7P -7A, for this patient?  Check the care team in [facility] and look for a) attending/consulting TRH provider listed and b) the [facility] team listed. Page or secure chat 7A-7P. Log into [email or web address] and use [place]'s universal password to access. If  you do not have the password, please contact the hospital operator. Locate the [facility] provider you are looking for under [facility] and page to a number that you can be directly reached. If you still have difficulty reaching the provider, please page the [facility] (Director on Call) for the Hospitalists listed on amion for assistance.

## 2023-05-24 NOTE — Plan of Care (Signed)
  Problem: Education: Goal: Ability to describe self-care measures that may prevent or decrease complications (Diabetes Survival Skills Education) will improve Outcome: Progressing Goal: Individualized Educational Video(s) Outcome: Progressing   Problem: Coping: Goal: Ability to adjust to condition or change in health will improve Outcome: Progressing   Problem: Fluid Volume: Goal: Ability to maintain a balanced intake and output will improve Outcome: Progressing   Problem: Health Behavior/Discharge Planning: Goal: Ability to identify and utilize available resources and services will improve Outcome: Progressing Goal: Ability to manage health-related needs will improve Outcome: Progressing   Problem: Metabolic: Goal: Ability to maintain appropriate glucose levels will improve Outcome: Progressing   Problem: Nutritional: Goal: Maintenance of adequate nutrition will improve Outcome: Progressing Goal: Progress toward achieving an optimal weight will improve Outcome: Progressing   Problem: Skin Integrity: Goal: Risk for impaired skin integrity will decrease Outcome: Progressing   Problem: Tissue Perfusion: Goal: Adequacy of tissue perfusion will improve Outcome: Progressing   Problem: Education: Goal: Knowledge of General Education information will improve Description: Including pain rating scale, medication(s)/side effects and non-pharmacologic comfort measures Outcome: Progressing   Problem: Health Behavior/Discharge Planning: Goal: Ability to manage health-related needs will improve Outcome: Progressing   Problem: Clinical Measurements: Goal: Ability to maintain clinical measurements within normal limits will improve Outcome: Progressing Goal: Will remain free from infection Outcome: Progressing Goal: Diagnostic test results will improve Outcome: Progressing Goal: Respiratory complications will improve Outcome: Progressing Goal: Cardiovascular complication will  be avoided Outcome: Progressing   Problem: Activity: Goal: Risk for activity intolerance will decrease Outcome: Progressing   Problem: Nutrition: Goal: Adequate nutrition will be maintained Outcome: Progressing   Problem: Coping: Goal: Level of anxiety will decrease Outcome: Progressing   Problem: Elimination: Goal: Will not experience complications related to bowel motility Outcome: Progressing Goal: Will not experience complications related to urinary retention Outcome: Progressing   Problem: Pain Managment: Goal: General experience of comfort will improve Outcome: Progressing   Problem: Safety: Goal: Ability to remain free from injury will improve Outcome: Progressing   Problem: Skin Integrity: Goal: Risk for impaired skin integrity will decrease Outcome: Progressing   Problem: Education: Goal: Knowledge of the prescribed therapy will improve Outcome: Progressing   Problem: Bowel/Gastric: Goal: Occurrences of nausea, vomiting, and/or diarrhea will decrease Outcome: Progressing   Problem: Coping: Goal: Level of anxiety will decrease Outcome: Progressing Goal: Ability to identify and develop effective coping behavior will improve Outcome: Progressing   Problem: Nutritional: Goal: Will achieve and/or maintain adequate nutritional intake Outcome: Progressing   Problem: Skin Integrity: Goal: Risk for impaired skin integrity will decrease Outcome: Progressing   Problem: Urinary Elimination: Goal: Complications of radiation-induced cystitis will be minimized/avoided Outcome: Progressing   Problem: Fatigue: Goal: Expression of feelings of increased energy will improve Outcome: Progressing

## 2023-05-24 NOTE — Progress Notes (Signed)
Physical Therapy Treatment Patient Details Name: Madison Ho MRN: [id number] DOB: 1951-06-13 Today's Date: 05/24/2023   History of Present Illness Patient is 72 y.o. female presented [facility] the [facility]07[facility]23 w/ arm and neck pain for ~2 months with recent weight loss and FTT. Pt found to have sepsis due to UTI, metastatic squamous cell carcinoma from left upper lobe mass with additional R adrenal metastases and 8mm mass at  the anterior/inferior aspect of the left lentiform nucleus. PMH significant for DM II, diabetic neuropathy,  HTN, HLD.    PT Comments  Pt agreeable to attempt mobility at this time and assisted to sitting EOB.  Pt not able to tolerate sitting very long and initiated return to supine on her own (yet still required assist).  Pt repositioned to comfort and RN notified of pt's c/o pain and request for medication.  Pt appears self limiting and has not really been OOB (yet also receiving radiation treatments and c/o of pain in multiple areas).    If plan is discharge home, recommend the following: A lot of help with bathing/dressing/bathroom;Assist for transportation;Help with stairs or ramp for entrance;Assistance with cooking/housework;Two people to help with walking and/or transfers   Can travel by private vehicle     No  Equipment Recommendations  None recommended by PT    Recommendations for Other Services       Precautions / Restrictions Precautions Precautions: Fall Precaution Comments: 3L O2     Mobility  Bed Mobility Overal bed mobility: Needs Assistance Bed Mobility: Supine to Sit, Sit to Supine, Rolling Rolling: Min assist   Supine to sit: HOB elevated, Used rails, Max assist Sit to supine: Mod assist   General bed mobility comments: increased time due to pain, assist required for LEs over EOB especially Rt LE, provided a hand for pt to pull trunk upright; Pt sat EOB for approx 1.5 mintues, pt returned trunk to supine without notification so assisted LEs  onto bed for comfort/pain control (remained on 3L O2 [place], HR 115 bpm)    Transfers                        Ambulation/Gait                   Stairs             Wheelchair Mobility     Tilt Bed    Modified Rankin (Stroke Patients Only)       Balance Overall balance assessment: Needs assistance Sitting-balance support: Bilateral upper extremity supported Sitting balance-Leahy Scale: Fair                                      Cognition Arousal: Alert Behavior During Therapy: Flat affect Overall Cognitive Status: Impaired/Different from baseline Area of Impairment: Problem solving                             Problem Solving: Difficulty sequencing, Requires verbal cues          Exercises      General Comments        Pertinent Vitals/Pain Pain Assessment Pain Assessment: Faces Faces Pain Scale: Hurts whole lot Pain Location: neck, Lt arm, right hip, "everywhere" Pain Descriptors / Indicators: Grimacing, Guarding, Discomfort Pain Intervention(s): Patient requesting pain meds-RN notified, Repositioned, Monitored during session  Home Living                          Prior Function            PT Goals (current goals can now be found in the care plan section) Progress towards PT goals: Not progressing toward goals - comment    Frequency    Min 1X/week      PT Plan      Co-evaluation              AM-PAC PT "6 Clicks" Mobility   Outcome Measure  Help needed turning from your back to your side while in a flat bed without using bedrails?: A Lot Help needed moving from lying on your back to sitting on the side of a flat bed without using bedrails?: A Lot Help needed moving to and from a bed to a chair (including a wheelchair)?: A Lot Help needed standing up from a chair using your arms (e.g., wheelchair or bedside chair)?: Total Help needed to walk in hospital room?: Total Help needed  climbing 3-5 steps with a railing? : Total 6 Click Score: 9    End of Session Equipment Utilized During Treatment: Oxygen Activity Tolerance: Patient limited by fatigue Patient left: in bed;with call bell/phone within reach;with bed alarm set Nurse Communication: Mobility status;Patient requests pain meds PT Visit Diagnosis: Difficulty in walking, not elsewhere classified (R26.2);Muscle weakness (generalized) (M62.81)     Time: 4098-1191 PT Time Calculation (min) (ACUTE ONLY): 16 min  Charges:    $Therapeutic Activity: 8-22 mins PT General Charges $$ ACUTE PT VISIT: 1 Visit                     Paulino Door, DPT Physical Therapist Acute Rehabilitation Services Office: [phone]    Janan Halter [place] 05/24/2023, 4:56 PM

## 2023-05-24 NOTE — Progress Notes (Signed)
Speech Language Pathology Treatment: Dysphagia  Patient Details Name: Madison Ho MRN: [id number] DOB: Aug 17, 1950 Today's Date: 05/24/2023 Time: 5784-6962 SLP Time Calculation (min) (ACUTE ONLY): 24 min  Assessment / Plan / Recommendation Clinical Impression  No clinical indications of aspiration with minimal thin water via straw with chin tuck, nectar thick coffee and bacon. She is NOT consistently following her chin tuck posture - and this is not vital for nectar thick liquids - but needed for thin liquid consumption. Prolonged mastication but adequate oral clearance noted. Today she is wearing her dentures and this has been helpful to masticate adequately. Pt with oral retention of viscous secretions mixed with blood on soft palate - that she was able to clear with swish and expectoration of thin water successfully. Of note, pt was given HONEY thick liquids instead of nectar - per daughter *Babette Relic* the pt's son ordered her drinks and nectar was sent. If pt is not sent proper consistency of liquids, she is at increased risk of dehydration. Pt thoroughly enjoyed nectar thick coffee today and reports that she does not mind nectar liquids. Advised to continue thin water between meals with CHIN TUCK to continue to train system to drink thin liquids. Pt would benefit from consideration for flutter valve and incentive spirometer to help with pulmonary protection. Messaged MD with requests.     HPI HPI: 72 year old home dwelling female admitted and found to have lung mass and now brain mets. Pt observed to cough with meals. PMH: HTN HLD DM TY 2 BMI 30  Prior smoker until about 2 months ago  Came to emergency room 10/8 with left shoulder pain 40 pound weight loss failure to thrive low-grade fever temp tachycardia and hypotension responsive to IV fluid . CT scan = large left lung mass concerning for primary lung malignancy-  10/9 biopsy left upper lobe showing metastatic squamous cell CA   10/11 oncology  consult 10-CT 4N2M1 SCC lung. MRI brain shows Solitary 8 mm intracranial metastasis at the anterior/inferior  aspect of the left lentiform nucleus. No significant mass effect.  CXR showed extensive left upper lobe  lung mass with veil like opacification over the left lower lobe.  2. Signs of venous congestion within the right lung.  Pt has undergone MBS showing audible aspiration of thin that was largely prevented to trace penetration with thin liquids.      SLP Plan  Continue with current plan of care      Recommendations for follow up therapy are one component of a multi-disciplinary discharge planning process, led by the attending physician.  Recommendations may be updated based on patient status, additional functional criteria and insurance authorization.    Recommendations  Diet recommendations: Nectar-thick liquid;Dysphagia 3 (mechanical soft) (thin water ok betwen meals) Medication Administration: Whole meds with puree Supervision: Full supervision/cueing for compensatory strategies Compensations: Slow rate;Small sips/bites;Chin tuck (thin water ok between meals) Postural Changes and/or Swallow Maneuvers: Upright 30-60 min after meal;Seated upright 90 degrees                  Oral care BID   Frequent or constant Supervision/Assistance Dysphagia, oropharyngeal phase (R13.12)     Continue with current plan of care   Rolena Infante, MS [facility] SLP Acute Rehab Services Office [phone]   Chales Abrahams  05/24/2023, 12:55 PM

## 2023-05-25 ENCOUNTER — Inpatient Hospital Stay (HOSPITAL_COMMUNITY): Payer: Medicare PPO

## 2023-05-25 ENCOUNTER — Ambulatory Visit: Payer: Medicare PPO

## 2023-05-25 ENCOUNTER — Ambulatory Visit
Admit: 2023-05-25 | Discharge: 2023-05-25 | Disposition: A | Discharge status: Home / Self Care | Payer: Medicare PPO | Attending: Radiation Oncology | Admitting: Radiation Oncology

## 2023-05-25 ENCOUNTER — Other Ambulatory Visit: Payer: Self-pay

## 2023-05-25 DIAGNOSIS — G8929 Other chronic pain: Secondary | ICD-10-CM | POA: Diagnosis not present

## 2023-05-25 DIAGNOSIS — Z7189 Other specified counseling: Secondary | ICD-10-CM | POA: Diagnosis not present

## 2023-05-25 DIAGNOSIS — R222 Localized swelling, mass and lump, trunk: Secondary | ICD-10-CM | POA: Diagnosis not present

## 2023-05-25 DIAGNOSIS — R5383 Other fatigue: Secondary | ICD-10-CM | POA: Diagnosis not present

## 2023-05-25 DIAGNOSIS — C3492 Malignant neoplasm of unspecified part of left bronchus or lung: Secondary | ICD-10-CM | POA: Diagnosis not present

## 2023-05-25 DIAGNOSIS — G928 Other toxic encephalopathy: Secondary | ICD-10-CM | POA: Diagnosis not present

## 2023-05-25 DIAGNOSIS — Z515 Encounter for palliative care: Secondary | ICD-10-CM | POA: Diagnosis not present

## 2023-05-25 DIAGNOSIS — M25512 Pain in left shoulder: Secondary | ICD-10-CM | POA: Diagnosis not present

## 2023-05-25 LAB — RAD ONC ARIA SESSION SUMMARY
Course Elapsed Days: 8
Plan Fractions Treated to Date: 2
Plan Prescribed Dose Per Fraction: 3 Gy
Plan Total Fractions Prescribed: 8
Plan Total Prescribed Dose: 24 Gy
Reference Point Dosage Given to Date: 12 Gy
Reference Point Session Dosage Given: 3 Gy
Session Number: 4

## 2023-05-25 LAB — BASIC METABOLIC PANEL
Anion gap: 12 (ref 5–15)
BUN: 39 mg/dL — ABNORMAL HIGH (ref 8–23)
CO2: 31 mmol/L (ref 22–32)
Calcium: 9.4 mg/dL (ref 8.9–10.3)
Chloride: 91 mmol/L — ABNORMAL LOW (ref 98–111)
Creatinine, Ser: 1.03 mg/dL — ABNORMAL HIGH (ref 0.44–1.00)
GFR, Estimated: 57 mL/min — ABNORMAL LOW (ref >60)
Glucose, Bld: 91 mg/dL (ref 70–99)
Potassium: 4.4 mmol/L (ref 3.5–5.1)
Sodium: 134 mmol/L — ABNORMAL LOW (ref 135–145)

## 2023-05-25 LAB — BLOOD GAS, VENOUS
Acid-Base Excess: 8.5 mmol/L — ABNORMAL HIGH (ref 0.0–2.0)
Bicarbonate: 35.3 mmol/L — ABNORMAL HIGH (ref 20.0–28.0)
O2 Saturation: 89.3 %
Patient temperature: 37.1
pCO2, Ven: 57 mm[Hg] (ref 44–60)
pH, Ven: 7.4 (ref 7.25–7.43)
pO2, Ven: 56 mm[Hg] — ABNORMAL HIGH (ref 32–45)

## 2023-05-25 LAB — GLUCOSE, CAPILLARY: Glucose-Capillary: 86 mg/dL (ref 70–99)

## 2023-05-25 MED ORDER — ALPRAZOLAM 0.25 MG PO TABS: 0.2500 mg | ORAL_TABLET | Freq: Once | ORAL | Status: DC

## 2023-05-25 MED ORDER — SALINE SPRAY 0.65 % NA SOLN: 1.0000 | NASAL | Status: DC | PRN

## 2023-05-25 MED ORDER — CHLORHEXIDINE GLUCONATE CLOTH 2 % EX PADS
6.0000 | MEDICATED_PAD | Freq: Every day | CUTANEOUS | Status: DC
Start: 2023-05-26 — End: 2023-05-28
  Administered 2023-05-26 – 2023-05-27 (×2): 6 via TOPICAL

## 2023-05-25 NOTE — Plan of Care (Signed)
  Problem: Education: Goal: Ability to describe self-care measures that may prevent or decrease complications (Diabetes Survival Skills Education) will improve Outcome: Progressing Goal: Individualized Educational Video(s) Outcome: Progressing   Problem: Coping: Goal: Ability to adjust to condition or change in health will improve Outcome: Progressing   Problem: Fluid Volume: Goal: Ability to maintain a balanced intake and output will improve Outcome: Progressing   Problem: Health Behavior/Discharge Planning: Goal: Ability to identify and utilize available resources and services will improve Outcome: Progressing Goal: Ability to manage health-related needs will improve Outcome: Progressing   Problem: Metabolic: Goal: Ability to maintain appropriate glucose levels will improve Outcome: Progressing   Problem: Nutritional: Goal: Maintenance of adequate nutrition will improve Outcome: Progressing Goal: Progress toward achieving an optimal weight will improve Outcome: Progressing   Problem: Skin Integrity: Goal: Risk for impaired skin integrity will decrease Outcome: Progressing   Problem: Tissue Perfusion: Goal: Adequacy of tissue perfusion will improve Outcome: Progressing   Problem: Education: Goal: Knowledge of General Education information will improve Description: Including pain rating scale, medication(s)/side effects and non-pharmacologic comfort measures Outcome: Progressing   Problem: Health Behavior/Discharge Planning: Goal: Ability to manage health-related needs will improve Outcome: Progressing   Problem: Clinical Measurements: Goal: Ability to maintain clinical measurements within normal limits will improve Outcome: Progressing Goal: Will remain free from infection Outcome: Progressing Goal: Diagnostic test results will improve Outcome: Progressing Goal: Respiratory complications will improve Outcome: Progressing Goal: Cardiovascular complication will  be avoided Outcome: Progressing   Problem: Activity: Goal: Risk for activity intolerance will decrease Outcome: Progressing   Problem: Nutrition: Goal: Adequate nutrition will be maintained Outcome: Progressing   Problem: Coping: Goal: Level of anxiety will decrease Outcome: Progressing   Problem: Elimination: Goal: Will not experience complications related to bowel motility Outcome: Progressing Goal: Will not experience complications related to urinary retention Outcome: Progressing   Problem: Pain Managment: Goal: General experience of comfort will improve Outcome: Progressing   Problem: Safety: Goal: Ability to remain free from injury will improve Outcome: Progressing   Problem: Skin Integrity: Goal: Risk for impaired skin integrity will decrease Outcome: Progressing   Problem: Education: Goal: Knowledge of the prescribed therapy will improve Outcome: Progressing   Problem: Bowel/Gastric: Goal: Occurrences of nausea, vomiting, and/or diarrhea will decrease Outcome: Progressing   Problem: Coping: Goal: Level of anxiety will decrease Outcome: Progressing Goal: Ability to identify and develop effective coping behavior will improve Outcome: Progressing   Problem: Nutritional: Goal: Will achieve and/or maintain adequate nutritional intake Outcome: Progressing   Problem: Skin Integrity: Goal: Risk for impaired skin integrity will decrease Outcome: Progressing   Problem: Urinary Elimination: Goal: Complications of radiation-induced cystitis will be minimized/avoided Outcome: Progressing   Problem: Fatigue: Goal: Expression of feelings of increased energy will improve Outcome: Progressing

## 2023-05-25 NOTE — Progress Notes (Signed)
Patient Name: Madison Ho           DOB: [birth year]  MRN: [id number]      Admission Date: 05/07/2023  Attending Provider: Hughie Closs, MD  Primary Diagnosis: <principal problem not specified>   Level of care: Stepdown    CROSS COVER NOTE   Date of Service   05/25/2023   [name], 72 y.o. female, was admitted on 05/07/2023 for <principal problem not specified>.    HPI/Events of Note   Hypoxia  Patient complained of SOB, vital showing SpO2 60s.  Patient was placed on 6 L [place], SpO2 68%. Nonrebreather was added and SpO2 improved >92%.  Unable to titrate O2 supply down, RT recommends trialing heated high flow. Will obtain chest x-ray, VBG, transferred to SDU for heated high flow. Family was updated.  They are concerned patient may be aspirating as she is not compliant with nectar thick diet.   Bedside evaluation- Patient is awake, A/O x3, with no associated distress.   Respiratory: Diminished breath sounds, no wheezing, no crackles.  Tachypnea.  No accessory muscle use. Cardiovascular: Tachycardia.  Mild extremity edema. 2+ pedal pulses.  Abdomen: Abdomen is soft and nontender. Positive bowel sounds noted in all quadrants.     Interventions/ Plan   Chest x-ray VBG Nebulizer treatment as needed Aspiration precaution Transfer to SDU        Anthoney Harada, DNP, ACNPC- AG [facility] [place]

## 2023-05-25 NOTE — Progress Notes (Signed)
Daily Progress Note   Patient Name: Madison Ho       Date: 05/25/2023 DOB: June 02, 1951  Age: 72 y.o. MRN#: [id number] Attending Physician: Hughie Closs, MD Primary Care Physician: Ignatius Specking, MD Admit Date: 05/07/2023 Length of Stay: 17 days  Reason for Consultation/Follow-up: Establishing goals of care  HPI/Patient Profile:  72 y.o. female  with past medical history of hypertension, hyperlipidemia, diabetes, neuropathy, obesity admitted on 05/07/2023 with significant weight loss, weakness, left shoulder pain and left chest swelling. Diagnosed with potential UTI and aspiration pneumonia but also with metastatic lung care to right adrenal, brain. Tentative plans for SRS radiation to brain but with ongoing decline will reassess goals of care before proceeding with treatment.   Subjective:   Subjective: Chart Reviewed. Updates received. Patient Assessed. Created space and opportunity for patient  and family to explore thoughts and feelings regarding current medical situation.  Today's Discussion: Today I saw the patient at the bedside, daughter was also present.  Daughter notes she had radiation yesterday and is planning for again today.  She does have occasional pain in her hip which nursing feels is likely related to the bed.  She does have pain medicine which she has been getting.  We also discussed getting morphine prior to radiation.  The patient states they have been positioning her differently and radiation which seems to help "100%" with her arm pain.  We reviewed the plan to continue radiation as long as she continues to tolerate it.  Plan for discharge to SNF for rehab with outpatient follow-up with oncology for further possible options.  She knows that if at any point she stops tolerating treatments we can have further discussions on other options.  The daughter seems understand as well.  Right now the plan for discharge is for SNF/rehab, although they will have difficulty until  radiation is completed because all the skilled nursing facilities are contacting cannot do daily radiation transportation due to cost.  Likely to remain inpatient until the end of radiation, anticipated to be the end of next week.  Continue to encourage discussion amongst family about DNR status and other options should there be a clinical decline.  I told them that palliative medicine would follow along with her chart for any significant changes.  We will not come see her every day but we are available if needed.  Referral has been made to the outpatient palliative medicine clinic in the cancer center to follow-up with the patient if she continues with treatment.  I provided emotional and general support through therapeutic listening, empathy, sharing of stories, and other techniques. I answered all questions and addressed all concerns to the best of my ability.  Review of Systems  Constitutional:  Positive for fatigue.  Respiratory:  Negative for cough and shortness of breath.        Left upper chest, shoulder, arm pain likely secondary to large lung mass  Gastrointestinal:  Negative for abdominal pain, nausea and vomiting.  Neurological:  Positive for weakness.    Objective:   Vital Signs:  BP 99/61 (BP Location: Right Arm)   Pulse (!) 106   Temp 97.7 F (36.5 C) (Oral)   Resp 18   Ht 5\' 5"  (1.651 m)   Wt 86.4 kg   SpO2 92%   BMI 31.70 kg/m   Physical Exam: Physical Exam Vitals and nursing note reviewed.  Constitutional:      General: She is sleeping. She is not in acute distress.  Appearance: She is ill-appearing. She is not toxic-appearing.  HENT:     Head: Normocephalic and atraumatic.  Cardiovascular:     Rate and Rhythm: Normal rate.  Pulmonary:     Effort: Pulmonary effort is normal. No respiratory distress.     Breath sounds: Examination of the left-upper field reveals decreased breath sounds. Decreased breath sounds present.  Abdominal:     General: Bowel  sounds are normal. There is no distension.     Palpations: Abdomen is soft.     Tenderness: There is no abdominal tenderness.  Skin:    General: Skin is warm and dry.  Neurological:     General: No focal deficit present.     Mental Status: She is easily aroused.  Psychiatric:        Mood and Affect: Mood normal.        Behavior: Behavior normal.     Palliative Assessment/Data: 30-40%    Existing Vynca/ACP Documentation: None  Assessment & Plan:   Impression: Present on Admission:  Sepsis secondary to UTI (HCC)  (Resolved) UTI (urinary tract infection)  (Resolved) Hypomagnesemia  (Resolved) Transient hypotension  Left shoulder pain  Compression fracture of body of thoracic vertebra (HCC)  Nicotine dependence, cigarettes, uncomplicated  Folate deficiency  Acute deep vein thrombosis (DVT) of axillary vein of left upper extremity (HCC)  Toxic metabolic encephalopathy  Type 2 diabetes mellitus with stage 3a chronic kidney disease, without long-term current use of insulin (HCC)  Acute renal failure superimposed on stage 3a chronic kidney disease (HCC)  Squamous cell lung cancer, left (HCC)  72 year old female with left 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  Known cancer not curable but treatable.  Pending foundation 1 testing.  Options could include targeted immunotherapy, depending on how she does moving forward.  Has started radiation therapy with planned 12 treatments to the lung and 1 treatment to the brain.  Plan for outpatient oncology follow-up.  If she does not do well or has a clinical decline with plan of treatment we would need to rediscuss CODE STATUS and comfort care; the patient's daughter seems realistic, her son is struggling a bit more at this.  Planned d/c to SNF after completing radiation (likely end of next week). In the meantime we will take it a day at a time and refer her to outpatient palliative medicine in the cancer center.  Long-term  prognosis poor.  SUMMARY OF RECOMMENDATIONS   Remain full code for now Continue full scope of care Continued radiation treatments Continued time for outcomes with treatment Further GOC discussions if the patient has declined in the coming days Outpatient palliative care referral to the cancer center clinic completed Palliative medicine will shadow her chart for any significant decline Please notify us of any significant clinical change or new palliative needs  Symptom Management:  Ultram 50 mg p.o. every 6 hours as needed Tylenol 1000 mg p.o. around-the-clock every 8 hours Gabapentin 100 mg twice daily Lidocaine 5% patch transdermal over 12 hours every 24 hours Further adjustments will be made as needed  Code Status: Full code  Prognosis: Unable to determine  Discharge Planning: To Be Determined  Discussed with: Patient, family, medical team, nursing team  Thank you for allowing [place] to participate in the care of Leoma Blauser PMT will continue to support holistically.  Time Total: 40 min  Detailed review of medical records (labs, imaging, vital signs), medically appropriate exam, discussed with treatment team, counseling and education to patient,  family, & staff, documenting clinical information, medication management, coordination of care  Wynne Dust, NP Palliative Medicine Team  Team Phone # [phone] (Nights/Weekends)  03/29/2021, 8:17 AM

## 2023-05-25 NOTE — Progress Notes (Signed)
PROGRESS NOTE    Madison Ho  DGU:[id number] DOB: 01-Dec-1950 DOA: 05/07/2023 PCP: Ignatius Specking, MD   Brief Narrative:  72 year old female with past medical history of hypertension, hyperlipidemia, diabetes mellitus type 2, COPD and neuropathy who presented to [facility] emergency department with left arm and shoulder pain 40 pound weight loss and failure to thrive.   Patient was admitted to hospital service. Workup included CT imaging which revealed a large left lung mass concerning for primary lung malignancy.  Biopsy of left upper lobe mass on 10/9 was consistent with squamous cell lung cancer showing local invasion, right adrenal metastases and a solitary 8 mm intracranial metastasis on MRI.  Dr. Cherly Hensen with Medical Oncology was consulted as well as radiation oncology.  Patient was transferred to [facility] for initiation of palliative XRT, 10 fractions of palliative chest radiation.  Palliative care is also been consulted for their assistance.     Hospital course has had multiple complications including an episode of observed aspiration on 10/14.  Patient was briefly made n.p.o. and speech therapy evaluation was carried out followed by modified barium swallow on 10/16.  An appropriate dysphagia 2 diet was then ordered.  Additionally patient developed a left upper extremity DVT identified on ultrasound on 10/17 for which Lovenox was initiated.   Considering associated debility and weakness patient is anticipated to go to a skilled nursing facility upon discharge.   Assessment & Plan:   Active Problems:   Squamous cell lung cancer, left (HCC)   Acute deep vein thrombosis (DVT) of axillary vein of left upper extremity (HCC)   Left shoulder pain   Dysphagia   Toxic metabolic encephalopathy   Acute renal failure superimposed on stage 3a chronic kidney disease (HCC)   Type 2 diabetes mellitus with stage 3a chronic kidney disease, without long-term current use of insulin  (HCC)   Folate deficiency   Goals of care, counseling/discussion   Sepsis secondary to UTI (HCC)   Nicotine dependence, cigarettes, uncomplicated   Compression fracture of body of thoracic vertebra (HCC)   Mediastinal mass   Mass of thoracic structure   DNR (do not resuscitate) discussion   Non-small cell lung cancer metastatic to adrenal gland (HCC)   Metastasis to brain (HCC)   Normocytic anemia   Low serum vitamin B12   Pressure injury of skin  Squamous cell lung cancer, left (HCC) Identification of large left lung mass upon this admission determined to be squamous cell lung cancer Masses substantial in size measuring 13 x 15 cm x 17 cm with encasement of the left main pulmonary artery with mass effect as well as abutting the distal trachea, encasing the left mainstem bronchus, occluding the left upper lobe bronchus and abutting the thoracic aorta. As a result patient is suffering from substantial left upper extremity pain and edema with concurrent development of DVT. Currently undergoing radiation therapy.  Patient has received 1 of 10 cycles thus far.  She needs 10 cycles, to be completed next week.  Morphine is ordered as needed every 2 hours.  She should get that at least an hour before going for radiation every day.   Acute deep vein thrombosis (DVT) of axillary vein of left upper extremity (HCC) Has edema.  Continue Lovenox.  Advised to keep it elevated.  Oncology has cleared her to be placed on Eliquis at the time of discharge.   Left shoulder pain Pain is felt to be completely related to ongoing lung malignancy  Patient is  currently on Lidoderm patches, Tylenol and Ultram.  Morphine as needed.   Dysphagia Patient experienced substantial lethargy and confusion on 10/14 with observed aspiration thought to be secondary to oxycodone Patient did receive a speech therapy evaluation and looks like patient was eventually advanced to consistent carbohydrate diet on 05/19/2023.    Toxic metabolic encephalopathy Substantial lethargy noted on 10/14, likely secondary to polypharmacy including repeated doses of oxycodone.  She is fully alert and oriented now.   Acute renal failure superimposed on stage 3a chronic kidney disease (HCC) Creatinine currently near baseline   Type 2 diabetes mellitus with stage 3a chronic kidney disease, without long-term current use of insulin (HCC) Good glycemic control Accu-Cheks before every meal and nightly with sliding scale insulin.   Folate deficiency Daily folic acid supplementation.   Goals of care, counseling/discussion Poor prognosis due to extensive progression of left lung squamous cell cancer Palliative care has been assisting in having goals of care discussions with family At this time patient is full code and is pursuing all available modalities of care.   Nicotine dependence, cigarettes, uncomplicated Counseling on cessation daily.   Sepsis secondary to UTI [facility]) Resolved, antibiotics complete.  Disposition: Patient needs to go to SNF.  Daughter has chosen either [place] or [facility].  None of those facilities are able to provide transportation back and forth for radiation on daily basis due to insurance issues.  Unfortunately, family is not able to provide transportation either.  For that reason, patient will need to stay in the hospital until completion of the radiation therapy (which will be at the end of the next week) and then discharged to SNF.  Discussed with TOC and daughter.  Hyperkalemia: Resolved with Lokelma.  DVT prophylaxis: SCDs Start: 05/08/23 0552 Lovenox   Code Status: Full Code  Family Communication: Daughter present at bedside.  Plan of care discussed with patient in length and he/she verbalized understanding and agreed with it.  Status is: Inpatient Remains inpatient appropriate because: Receiving radiation therapy on daily basis, cannot be discharged until completed radiation therapy by next  week.  Details above.   Estimated body mass index is 31.7 kg/m as calculated from the following:   Height as of this encounter: 5\' 5"  (1.651 m).   Weight as of this encounter: 86.4 kg.  Pressure Injury 05/15/23 Sacrum Stage 1 -  Intact skin with non-blanchable redness of a localized area usually over a bony prominence. blanchable red area to sacrum (Active)  05/15/23 2210  Location: Sacrum  Location Orientation:   Staging: Stage 1 -  Intact skin with non-blanchable redness of a localized area usually over a bony prominence.  Wound Description (Comments): blanchable red area to sacrum  Present on Admission: Yes  Dressing Type Foam - Lift dressing to assess site every shift 05/24/23 1950   Nutritional Assessment: Body mass index is 31.7 kg/m.Marland Kitchen Seen by dietician.  I agree with the assessment and plan as outlined below: Nutrition Status:        . Skin Assessment: I have examined the patient's skin and I agree with the wound assessment as performed by the wound care RN as outlined below: Pressure Injury 05/15/23 Sacrum Stage 1 -  Intact skin with non-blanchable redness of a localized area usually over a bony prominence. blanchable red area to sacrum (Active)  05/15/23 2210  Location: Sacrum  Location Orientation:   Staging: Stage 1 -  Intact skin with non-blanchable redness of a localized area usually over a bony prominence.  Wound  Description (Comments): blanchable red area to sacrum  Present on Admission: Yes  Dressing Type Foam - Lift dressing to assess site every shift 05/24/23 1950    Consultants:  Palliative care and radiation therapy  Procedures:  As above  Antimicrobials:  Anti-infectives (From admission, onward)    Start     Dose/Rate Route Frequency Ordered Stop   05/13/23 2015  cefTRIAXone (ROCEPHIN) 2 g in sodium chloride 0.9 % 100 mL IVPB  Status:  Discontinued        2 g 200 mL/hr over 30 Minutes Intravenous Every 24 hours 05/13/23 1919 05/14/23 0742    05/09/23 1000  vancomycin (VANCOCIN) IVPB 1000 mg/200 mL premix  Status:  Discontinued        1,000 mg 200 mL/hr over 60 Minutes Intravenous Every 24 hours 05/08/23 0558 05/09/23 1406   05/08/23 1230  cefTRIAXone (ROCEPHIN) 2 g in sodium chloride 0.9 % 100 mL IVPB        2 g 200 mL/hr over 30 Minutes Intravenous Every 24 hours 05/08/23 1226 05/10/23 1307   05/08/23 0045  aztreonam (AZACTAM) 2 g in sodium chloride 0.9 % 100 mL IVPB  Status:  Discontinued        2 g 200 mL/hr over 30 Minutes Intravenous  Once 05/08/23 0034 05/08/23 0037   05/08/23 0045  metroNIDAZOLE (FLAGYL) IVPB 500 mg        500 mg 100 mL/hr over 60 Minutes Intravenous  Once 05/08/23 0034 05/08/23 0435   05/08/23 0045  vancomycin (VANCOCIN) IVPB 1000 mg/200 mL premix  Status:  Discontinued        1,000 mg 200 mL/hr over 60 Minutes Intravenous  Once 05/08/23 0034 05/08/23 0036   05/08/23 0045  vancomycin (VANCOREADY) IVPB 1500 mg/300 mL        1,500 mg 150 mL/hr over 120 Minutes Intravenous  Once 05/08/23 0037 05/08/23 0647   05/08/23 0045  ceFEPIme (MAXIPIME) 2 g in sodium chloride 0.9 % 100 mL IVPB        2 g 200 mL/hr over 30 Minutes Intravenous  Once 05/08/23 0037 05/08/23 0334         Subjective: Seen and examined.  Daughter sleeping at the bedside.  Patient has no complaints and she is concentrated on watching TV.  Objective: Vitals:   05/24/23 1331 05/24/23 2148 05/25/23 0541 05/25/23 0749  BP: 96/67 (!) 103/59 99/61   Pulse: 100 (!) 106    Resp: 20 18 18    Temp: 98.6 F (37 C) 98 F (36.7 C) 97.7 F (36.5 C)   TempSrc: Oral Oral Oral   SpO2: 91% 95% 93% 92%  Weight:      Height:        Intake/Output Summary (Last 24 hours) at 05/25/2023 1115 Last data filed at 05/25/2023 0500 Gross per 24 hour  Intake --  Output 325 ml  Net -325 ml   Filed Weights   05/21/23 0553 05/22/23 0428 05/23/23 0500  Weight: 86.5 kg 86.7 kg 86.4 kg    Examination:  General exam: Appears calm and comfortable   Respiratory system: Clear to auscultation. Respiratory effort normal. Cardiovascular system: S1 & S2 heard, RRR. No JVD, murmurs, rubs, gallops or clicks.  Upper extremity edema is improving. Gastrointestinal system: Abdomen is nondistended, soft and nontender. No organomegaly or masses felt. Normal bowel sounds heard. Central nervous system: Alert and oriented. No focal neurological deficits. Extremities: Symmetric 5 x 5 power. Skin: No rashes, lesions or ulcers.  Psychiatry: Judgement and  insight appear normal. Mood & affect appropriate.   Data Reviewed: I have personally reviewed following labs and imaging studies  CBC: Recent Labs  Lab 05/19/23 1416 05/24/23 0928  WBC 11.4* 8.5  NEUTROABS 8.9* 6.4  HGB 11.3* 10.3*  HCT 38.0 33.8*  MCV 100.3* 100.3*  PLT 397 392   Basic Metabolic Panel: Recent Labs  Lab 05/18/23 1204 05/19/23 1416 05/24/23 0928 05/25/23 0619  NA 135 132* 134* 134*  K 5.1 5.5* 5.3* 4.4  CL 94* 91* 93* 91*  CO2 30 30 29 31   GLUCOSE 120* 106* 121* 91  BUN 32* 37* 39* 39*  CREATININE 1.13* 1.07* 1.04* 1.03*  CALCIUM 9.1 9.0 9.2 9.4  MG  --   --  2.0  --    GFR: Estimated Creatinine Clearance: 52.8 mL/min (A) (by C-G formula based on SCr of 1.03 mg/dL (H)). Liver Function Tests: Recent Labs  Lab 05/19/23 1416  AST 22  ALT 14  ALKPHOS 52  BILITOT 0.4  PROT 6.2*  ALBUMIN 2.3*   No results for input(s): "LIPASE", "AMYLASE" in the last 168 hours. No results for input(s): "AMMONIA" in the last 168 hours. Coagulation Profile: Recent Labs  Lab 05/19/23 1416  INR 1.1   Cardiac Enzymes: No results for input(s): "CKTOTAL", "CKMB", "CKMBINDEX", "TROPONINI" in the last 168 hours. BNP (last 3 results) No results for input(s): "PROBNP" in the last 8760 hours. HbA1C: No results for input(s): "HGBA1C" in the last 72 hours. CBG: Recent Labs  Lab 05/24/23 0721 05/24/23 1142 05/24/23 1621 05/24/23 2146 05/25/23 0727  GLUCAP 136* 88 96 121* 86    Lipid Profile: No results for input(s): "CHOL", "HDL", "LDLCALC", "TRIG", "CHOLHDL", "LDLDIRECT" in the last 72 hours. Thyroid Function Tests: No results for input(s): "TSH", "T4TOTAL", "FREET4", "T3FREE", "THYROIDAB" in the last 72 hours. Anemia Panel: No results for input(s): "VITAMINB12", "FOLATE", "FERRITIN", "TIBC", "IRON", "RETICCTPCT" in the last 72 hours. Sepsis Labs: No results for input(s): "PROCALCITON", "LATICACIDVEN" in the last 168 hours.  No results found for this or any previous visit (from the past 240 hour(s)).   Radiology Studies: No results found.  Scheduled Meds:  acetaminophen  1,000 mg Oral Q8H   vitamin B-12  1,000 mcg Oral Daily   enoxaparin (LOVENOX) injection  80 mg Subcutaneous Q12H   feeding supplement  237 mL Oral BID BM   folic acid  1 mg Oral Daily   gabapentin  100 mg Oral BID   lidocaine  2 patch Transdermal Q24H   mouth rinse  15 mL Mouth Rinse 4 times per day   polyethylene glycol  17 g Oral Daily   senna-docusate  1 tablet Oral QHS   umeclidinium bromide  1 puff Inhalation Daily   Continuous Infusions:   LOS: 17 days   Hughie Closs, MD [facility]  05/25/2023, 11:15 AM   *Please note that this is a verbal dictation therefore any spelling or grammatical errors are due to the "Dragon Medical One" system interpretation.  Please page via Amion and do not message via secure chat for urgent patient care matters. Secure chat can be used for non urgent patient care matters.  How to contact the [facility] Attending or Consulting provider 7A - 7P or covering provider during after hours 7P -7A, for this patient?  Check the care team in [facility] and look for a) attending/consulting [facility] provider listed and b) the [facility] team listed. Page or secure chat 7A-7P. Log into [email or web address] and use [place]'s universal password to  access. If you do not have the password, please contact the hospital operator. Locate the [facility] provider you are looking for under  [facility] and page to a number that you can be directly reached. If you still have difficulty reaching the provider, please page the [facility] (Director on Call) for the Hospitalists listed on amion for assistance.

## 2023-05-26 ENCOUNTER — Inpatient Hospital Stay (HOSPITAL_COMMUNITY): Payer: Medicare PPO

## 2023-05-26 DIAGNOSIS — S22000A Wedge compression fracture of unspecified thoracic vertebra, initial encounter for closed fracture: Secondary | ICD-10-CM | POA: Diagnosis not present

## 2023-05-26 DIAGNOSIS — M25512 Pain in left shoulder: Secondary | ICD-10-CM | POA: Diagnosis not present

## 2023-05-26 DIAGNOSIS — Z515 Encounter for palliative care: Secondary | ICD-10-CM | POA: Diagnosis not present

## 2023-05-26 DIAGNOSIS — E1122 Type 2 diabetes mellitus with diabetic chronic kidney disease: Secondary | ICD-10-CM | POA: Diagnosis not present

## 2023-05-26 DIAGNOSIS — Z7189 Other specified counseling: Secondary | ICD-10-CM | POA: Diagnosis not present

## 2023-05-26 DIAGNOSIS — J90 Pleural effusion, not elsewhere classified: Secondary | ICD-10-CM

## 2023-05-26 DIAGNOSIS — J9601 Acute respiratory failure with hypoxia: Secondary | ICD-10-CM

## 2023-05-26 DIAGNOSIS — I82A12 Acute embolism and thrombosis of left axillary vein: Secondary | ICD-10-CM | POA: Diagnosis not present

## 2023-05-26 DIAGNOSIS — C3492 Malignant neoplasm of unspecified part of left bronchus or lung: Secondary | ICD-10-CM | POA: Diagnosis not present

## 2023-05-26 DIAGNOSIS — C7931 Secondary malignant neoplasm of brain: Secondary | ICD-10-CM | POA: Diagnosis not present

## 2023-05-26 DIAGNOSIS — Z79899 Other long term (current) drug therapy: Secondary | ICD-10-CM

## 2023-05-26 LAB — MRSA NEXT GEN BY PCR, NASAL: MRSA by PCR Next Gen: NOT DETECTED

## 2023-05-26 MED ORDER — MORPHINE SULFATE (PF) 2 MG/ML IV SOLN
1.0000 mg | INTRAVENOUS | Status: DC | PRN
  Administered 2023-05-27 – 2023-05-28 (×4): 2 mg via INTRAVENOUS
  Filled 2023-05-26 (×4): qty 1

## 2023-05-26 MED ORDER — FLEET ENEMA RE ENEM
1.0000 | ENEMA | Freq: Every day | RECTAL | Status: DC | PRN
  Administered 2023-05-26: 1 via RECTAL
  Filled 2023-05-26: qty 1

## 2023-05-26 NOTE — Plan of Care (Signed)
  Problem: Fluid Volume: Goal: Ability to maintain a balanced intake and output will improve Outcome: Progressing   Problem: Education: Goal: Knowledge of General Education information will improve Description: Including pain rating scale, medication(s)/side effects and non-pharmacologic comfort measures Outcome: Progressing   Problem: Coping: Goal: Ability to adjust to condition or change in health will improve Outcome: Not Progressing   Problem: Health Behavior/Discharge Planning: Goal: Ability to manage health-related needs will improve Outcome: Not Progressing   Problem: Skin Integrity: Goal: Risk for impaired skin integrity will decrease Outcome: Not Progressing   Problem: Clinical Measurements: Goal: Ability to maintain clinical measurements within normal limits will improve Outcome: Not Progressing Goal: Diagnostic test results will improve Outcome: Not Progressing Goal: Respiratory complications will improve Outcome: Not Progressing Goal: Cardiovascular complication will be avoided Outcome: Not Progressing

## 2023-05-26 NOTE — Progress Notes (Signed)
Daily Progress Note   Patient Name: Madison Ho       Date: 05/26/2023 DOB: Jul 25, 1951  Age: 72 y.o. MRN#: [id number] Attending Physician: Hughie Closs, MD Primary Care Physician: Ignatius Specking, MD Admit Date: 05/07/2023 Length of Stay: 18 days  Reason for Consultation/Follow-up: Establishing goals of care  Subjective:   CC: Patient notes breathing okay right now with extra O2 support. Following up regarding complex medical decision making.   Subjective:  Reviewed EMR prior to presenting to bedside.  Overnight patient became hypoxic and was transferred to the stepdown unit for closer observation and higher level of care.  Patient currently receiving high flow nasal cannula at 15 L/min support.  Chest x-ray obtained showed worsening whiteout of left lung as compared to prior imaging.  CT imaging for further workup has been ordered.  Presented to bedside to meet with patient.  RN present at bedside as well.  Introduced myself as a member of the palliative medicine team.  Inquired how patient was feeling at this time.  Patient noted that her breathing feels more supported with higher oxygen in place.  Noted her breathing was very bad last night which she was telling people.  Acknowledged this and that is why she was transferred to the stepdown unit.  Patient able to state that she has been in the hospital receiving radiation to help with management of her cancer.  Acknowledged of this.  Explained to her that imaging from a chest x-ray has shown worsening on the left side of her lung where her cancer is.  Discussed plan is to get more specific imaging though worried this could be related to cancer progression or necrosis from radiation.  Expressed concern that if this is related to her cancer, may not be reversible and may need to discuss how to focus on quality time with her family. Inquired about family support in the area and she describes that her son is closer to the hospital here while her  daughter lives in [place].  She does have a grandchild.  Inquired if she wanted me to call initially to update regarding her transfer to the ICU and everything going on currently and she elected that her son be called since he is closer to the hospital.  Acknowledged would do this after visit with her.  With permission, also able to discuss CODE STATUS with patient.  Expressed concern that with her worsening respiratory status, she currently has elected for full code which means chest compressions and intubation with mechanical ventilation.  Patient states she would never "want to be hooked up to life support machines".  She thought her uncle go through this and she does not want to go through it herself.  Acknowledged this and noted that her status should then be DNR, continue appropriate medical therapy.  Patient agreeing with this change.  Noted would call and update her son regarding this as well.  All questions answered at that time.  Noted palliative medicine team continue to follow along with patient's medical journey.  Called patient's son, Madison Ho, and introduced myself as a member of the palliative medicine team.  Son had heard patient was transferred to the ICU though had not heard recent updates about his mother's medical care.  Took time to explain reason why patient was transferred to the ICU for management of worsening respiratory status.  Explained chest x-ray imaging showing worsening function of the left side of her lung.  Currently trying to obtain CT imaging for  further analysis.  Son noted were going to "fix this right?".  Expressed that at this time we are continuing all appropriate medical therapies to try and reverse the process going on in her lungs if able.  Also explained concern that if this is related to patient's lung cancer, we are not fixing her lung cancer and so sometimes the best way to fix things is to allow people to be comfortable and enjoy quality time with family.  Spent  time providing emotional support as able.  With permission also able to update son about discussion with patient regarding CODE STATUS.  Patient's son acknowledges that patient would "never want to be on machines".  Discussed we will be changing CODE STATUS to DNR while continuing all appropriate medical interventions.  Son acknowledged this.  Inquired if this provider needs to call and update his sister as well regarding medical updates and he noted that he is about to call her so they can both come to bedside to visit with patient.  All questions answered at that time.  Noted palliative medicine team will continue to follow along with patient's medical journey.  Objective:   Vital Signs:  BP 101/67   Pulse 92   Temp 98.5 F (36.9 C) (Oral)   Resp 13   Ht 5\' 5"  (1.651 m)   Wt 86.4 kg   SpO2 97%   BMI 31.70 kg/m   Physical Exam: General: Ill-appearing, debilitated, laying in bed HENT: Dry mucous membranes Cardiovascular: RRR Respiratory: increased work of breathing noted, on high flow nasal cannula 15 L/min Neuro: Alert, interactive, appropriately answering questions, following commands easily Psych: appropriately answers all questions  Imaging: I personally reviewed recent imaging.   Assessment & Plan:   Assessment: Patient is a 72 y.o. female with past medical history of hypertension, hyperlipidemia, diabetes, neuropathy, obesity admitted on 05/07/2023 with significant weight loss, weakness, left shoulder pain and left chest swelling. Diagnosed with potential UTI and aspiration pneumonia but also with metastatic lung care to right adrenal, brain. Tentative plans for SRS radiation to brain but with ongoing decline will reassess goals of care before proceeding with treatment.   Recommendations/Plan: # Complex medical decision making/goals of care:  Discussed care with patient and then able to call patient's son as well to discuss care. Patient states she does not want to be "hooked  up on life support machines" like another family member was. Discussed change of code status to DNR, continue appropraite medical therapies. NOT comfort care. Patient agreeing with this change. Patient requested I call her son since he lives closer to the hospital. Also updated him about discussion and he agreed his mother does not want to be put on life support and agreed with change of code status. Did update him about concerns regarding medical deterioration in the setting of metastatic lung cancer. Son planning to update his sister (patient's daughter) and they both come to visit today.   -  Code Status: Limited: Do not attempt resuscitation (DNR) -DNR-LIMITED -Do Not Intubate/DNI   # Symptom management:  -Pain/dyspnea, in setting of static lung cancer   -Changed IV morphine to 1-2mg  q2 hrs prn breakthrough pain/dyspnea -Ultram 50 mg p.o. every 6 hours as needed -Tylenol 1000 mg p.o. around-the-clock every 8 hours - Gabapentin 100 mg twice daily - Lidocaine 5% patch transdermal over 12 hours every 24 hours  # Psychosocial Support:  -son, daughter  # Discharge Planning: To Be Determined  Discussed with: patient, hospitalist, PCCM, RN, patient's son  Thank you for allowing the palliative care team to participate in the care [facility].  Alvester Morin, DO Palliative Care Provider PMT # [phone]  If patient remains symptomatic despite maximum doses, please call PMT at [phone] between 0700 and 1900. Outside of these hours, please call attending, as PMT does not have night coverage.  *Please note that this is a verbal dictation therefore any spelling or grammatical errors are due to the "Dragon Medical One" system interpretation.

## 2023-05-26 NOTE — Progress Notes (Signed)
PROGRESS NOTE    Madison Ho  UEA:[id number] DOB: 05/28/1951 DOA: 05/07/2023 PCP: Ignatius Specking, MD   Brief Narrative:  72 year old female with past medical history of hypertension, hyperlipidemia, diabetes mellitus type 2, COPD and neuropathy who presented to [facility] emergency department with left arm and shoulder pain 40 pound weight loss and failure to thrive.   Patient was admitted to hospital service. Workup included CT imaging which revealed a large left lung mass concerning for primary lung malignancy.  Biopsy of left upper lobe mass on 10/9 was consistent with squamous cell lung cancer showing local invasion, right adrenal metastases and a solitary 8 mm intracranial metastasis on MRI.  Dr. Cherly Hensen with Medical Oncology was consulted as well as radiation oncology.  Patient was transferred to [facility] for initiation of palliative XRT, 10 fractions of palliative chest radiation.  Palliative care is also been consulted for their assistance.     Hospital course has had multiple complications including an episode of observed aspiration on 10/14.  Patient was briefly made n.p.o. and speech therapy evaluation was carried out followed by modified barium swallow on 10/16.  An appropriate dysphagia 2 diet was then ordered.  Additionally patient developed a left upper extremity DVT identified on ultrasound on 10/17 for which Lovenox was initiated.   Considering associated debility and weakness patient is anticipated to go to a skilled nursing facility upon discharge.   Assessment & Plan:   Active Problems:   Squamous cell lung cancer, left (HCC)   Acute deep vein thrombosis (DVT) of axillary vein of left upper extremity (HCC)   Left shoulder pain   Dysphagia   Toxic metabolic encephalopathy   Acute renal failure superimposed on stage 3a chronic kidney disease (HCC)   Type 2 diabetes mellitus with stage 3a chronic kidney disease, without long-term current use of insulin  (HCC)   Folate deficiency   Goals of care, counseling/discussion   Sepsis secondary to UTI (HCC)   Nicotine dependence, cigarettes, uncomplicated   Compression fracture of body of thoracic vertebra (HCC)   Mediastinal mass   Mass of thoracic structure   DNR (do not resuscitate) discussion   Non-small cell lung cancer metastatic to adrenal gland (HCC)   Metastasis to brain (HCC)   Normocytic anemia   Low serum vitamin B12   Pressure injury of skin  Squamous cell lung cancer, left (HCC) Identification of large left lung mass upon this admission determined to be squamous cell lung cancer Masses substantial in size measuring 13 x 15 cm x 17 cm with encasement of the left main pulmonary artery with mass effect as well as abutting the distal trachea, encasing the left mainstem bronchus, occluding the left upper lobe bronchus and abutting the thoracic aorta. As a result patient is suffering from substantial left upper extremity pain and edema with concurrent development of DVT. Currently undergoing radiation therapy.  She needs 10 cycles, to be completed next week.  Morphine is ordered as needed every 2 hours.  She should get that at least an hour before going for radiation every day.  Acute hypoxic respiratory failure: Overnight, patient developed respiratory distress and hypoxia, repeat chest x-ray was done which yet again showed complete opacification of the left lung but this time slightly worse.  She was placed on 15 L of high flow oxygen, transferred to stepdown unit.  This morning she appears to be slightly lethargic, usually she is alert.  She denied any shortness of breath despite of requiring  high amount of oxygen.  Critical care was consulted, they are going to get CT of the chest but per them, this is likely necrosis/fibrosis secondary to radiation or worsening of the left lung cancer.  Pression pneumonia cannot be excluded either.  They recommend not to intubate as she may not come out of  the intubation due to her respiratory and lung cancer status.  I have discussed with Dr. Patterson Hammersmith of palliative care in person and requested to have frank conversation with patient and family.  I think she has very poor prognosis and we shall focus on comfort care.  Appreciate both PCCM and palliative care help.  Will defer to PCCM if any antibiotics are indicated.  Addendum: Once CT chest results were available, I met with patient's son and his girlfriend at the bedside around 5:30 PM.  Updated them about CT chest results.  It looks like the cancer/mass is getting worse and this is terminal.  I recommended focusing on comfort care.  He verbalized understanding and was going to talk to his mother.  We will defer to palliative care to lead on that topic.   Acute deep vein thrombosis (DVT) of axillary vein of left upper extremity (HCC) Has edema.  Continue Lovenox.  Advised to keep it elevated.  Oncology has cleared her to be placed on Eliquis at the time of discharge.   Left shoulder pain Pain is felt to be completely related to ongoing lung malignancy  Patient is currently on Lidoderm patches, Tylenol and Ultram.  Morphine as needed.   Dysphagia Patient experienced substantial lethargy and confusion on 10/14 with observed aspiration thought to be secondary to oxycodone Patient did receive a speech therapy evaluation and looks like patient was eventually advanced to consistent carbohydrate diet on 05/19/2023.   Toxic metabolic encephalopathy Substantial lethargy noted on 10/14, likely secondary to polypharmacy including repeated doses of oxycodone.  She is lethargic due to hypoxia but she is fully oriented.   Acute renal failure superimposed on stage 3a chronic kidney disease (HCC) Creatinine currently near baseline   Type 2 diabetes mellitus with stage 3a chronic kidney disease, without long-term current use of insulin (HCC) Good glycemic control Accu-Cheks before every meal and nightly with  sliding scale insulin.   Folate deficiency Daily folic acid supplementation.   Goals of care, counseling/discussion Poor prognosis due to extensive progression of left lung squamous cell cancer Palliative care has been assisting in having goals of care discussions with family At this time patient is full code and is pursuing all available modalities of care.  Further discussion with Dr. Patterson Hammersmith as mentioned above.   Nicotine dependence, cigarettes, uncomplicated Counseling on cessation daily.   Sepsis secondary to UTI [facility]) Resolved, antibiotics complete.  Disposition: Patient needs to go to SNF.  Daughter has chosen either [place] or [facility].  None of those facilities are able to provide transportation back and forth for radiation on daily basis due to insurance issues.  Unfortunately, family is not able to provide transportation either.  For that reason, patient will need to stay in the hospital until completion of the radiation therapy (which will be at the end of the next week) and then discharged to SNF.  Discussed with TOC and daughter.  Hyperkalemia: Resolved with Lokelma.  DVT prophylaxis: SCDs Start: 05/08/23 0552 Lovenox   Code Status: Full Code  Family Communication: None present at bedside.    Status is: Inpatient Remains inpatient appropriate because: Receiving radiation therapy on daily basis, cannot be  discharged until completed radiation therapy by next week.  Details above.   Estimated body mass index is 31.7 kg/m as calculated from the following:   Height as of this encounter: 5\' 5"  (1.651 m).   Weight as of this encounter: 86.4 kg.  Pressure Injury 05/15/23 Sacrum Stage 1 -  Intact skin with non-blanchable redness of a localized area usually over a bony prominence. blanchable red area to sacrum (Active)  05/15/23 2210  Location: Sacrum  Location Orientation:   Staging: Stage 1 -  Intact skin with non-blanchable redness of a localized area usually over a bony  prominence.  Wound Description (Comments): blanchable red area to sacrum  Present on Admission: Yes  Dressing Type Foam - Lift dressing to assess site every shift 05/26/23 0000   Nutritional Assessment: Body mass index is 31.7 kg/m.Marland Kitchen Seen by dietician.  I agree with the assessment and plan as outlined below: Nutrition Status:        . Skin Assessment: I have examined the patient's skin and I agree with the wound assessment as performed by the wound care RN as outlined below: Pressure Injury 05/15/23 Sacrum Stage 1 -  Intact skin with non-blanchable redness of a localized area usually over a bony prominence. blanchable red area to sacrum (Active)  05/15/23 2210  Location: Sacrum  Location Orientation:   Staging: Stage 1 -  Intact skin with non-blanchable redness of a localized area usually over a bony prominence.  Wound Description (Comments): blanchable red area to sacrum  Present on Admission: Yes  Dressing Type Foam - Lift dressing to assess site every shift 05/26/23 0000    Consultants:  Palliative care and radiation therapy  Procedures:  As above  Antimicrobials:  Anti-infectives (From admission, onward)    Start     Dose/Rate Route Frequency Ordered Stop   05/13/23 2015  cefTRIAXone (ROCEPHIN) 2 g in sodium chloride 0.9 % 100 mL IVPB  Status:  Discontinued        2 g 200 mL/hr over 30 Minutes Intravenous Every 24 hours 05/13/23 1919 05/14/23 0742   05/09/23 1000  vancomycin (VANCOCIN) IVPB 1000 mg/200 mL premix  Status:  Discontinued        1,000 mg 200 mL/hr over 60 Minutes Intravenous Every 24 hours 05/08/23 0558 05/09/23 1406   05/08/23 1230  cefTRIAXone (ROCEPHIN) 2 g in sodium chloride 0.9 % 100 mL IVPB        2 g 200 mL/hr over 30 Minutes Intravenous Every 24 hours 05/08/23 1226 05/10/23 1307   05/08/23 0045  aztreonam (AZACTAM) 2 g in sodium chloride 0.9 % 100 mL IVPB  Status:  Discontinued        2 g 200 mL/hr over 30 Minutes Intravenous  Once 05/08/23  0034 05/08/23 0037   05/08/23 0045  metroNIDAZOLE (FLAGYL) IVPB 500 mg        500 mg 100 mL/hr over 60 Minutes Intravenous  Once 05/08/23 0034 05/08/23 0435   05/08/23 0045  vancomycin (VANCOCIN) IVPB 1000 mg/200 mL premix  Status:  Discontinued        1,000 mg 200 mL/hr over 60 Minutes Intravenous  Once 05/08/23 0034 05/08/23 0036   05/08/23 0045  vancomycin (VANCOREADY) IVPB 1500 mg/300 mL        1,500 mg 150 mL/hr over 120 Minutes Intravenous  Once 05/08/23 0037 05/08/23 0647   05/08/23 0045  ceFEPIme (MAXIPIME) 2 g in sodium chloride 0.9 % 100 mL IVPB  2 g 200 mL/hr over 30 Minutes Intravenous  Once 05/08/23 0037 05/08/23 0334         Subjective: Patient seen and examined in stepdown unit.  She was slightly lethargic but oriented.  Able to speak but had to stop couple of times in order to take deep breathing.  Denied any shortness of breath.  Objective: Vitals:   05/25/23 2324 05/26/23 0348 05/26/23 0812 05/26/23 0839  BP:      Pulse:   92   Resp:   13   Temp: 98.8 F (37.1 C) 98 F (36.7 C)  98.5 F (36.9 C)  TempSrc: Oral Oral  Oral  SpO2:   97%   Weight:      Height:        Intake/Output Summary (Last 24 hours) at 05/26/2023 1200 Last data filed at 05/25/2023 1738 Gross per 24 hour  Intake 240 ml  Output 450 ml  Net -210 ml   Filed Weights   05/21/23 0553 05/22/23 0428 05/23/23 0500  Weight: 86.5 kg 86.7 kg 86.4 kg    Examination:  General exam: Appears lethargic Respiratory system: Diminished breath sounds with tachypnea. Cardiovascular system: S1 & S2 heard, RRR. No JVD, murmurs, rubs, gallops or clicks.  +3 pitting edema left upper extremity. Gastrointestinal system: Abdomen is nondistended, soft and nontender. No organomegaly or masses felt. Normal bowel sounds heard. Central nervous system: Lethargic but is still oriented.  No focal deficit. Extremities: Symmetric 5 x 5 power. Skin: No rashes, lesions or ulcers.    Data Reviewed: I have  personally reviewed following labs and imaging studies  CBC: Recent Labs  Lab 05/19/23 1416 05/24/23 0928  WBC 11.4* 8.5  NEUTROABS 8.9* 6.4  HGB 11.3* 10.3*  HCT 38.0 33.8*  MCV 100.3* 100.3*  PLT 397 392   Basic Metabolic Panel: Recent Labs  Lab 05/19/23 1416 05/24/23 0928 05/25/23 0619  NA 132* 134* 134*  K 5.5* 5.3* 4.4  CL 91* 93* 91*  CO2 30 29 31   GLUCOSE 106* 121* 91  BUN 37* 39* 39*  CREATININE 1.07* 1.04* 1.03*  CALCIUM 9.0 9.2 9.4  MG  --  2.0  --    GFR: Estimated Creatinine Clearance: 52.8 mL/min (A) (by C-G formula based on SCr of 1.03 mg/dL (H)). Liver Function Tests: Recent Labs  Lab 05/19/23 1416  AST 22  ALT 14  ALKPHOS 52  BILITOT 0.4  PROT 6.2*  ALBUMIN 2.3*   No results for input(s): "LIPASE", "AMYLASE" in the last 168 hours. No results for input(s): "AMMONIA" in the last 168 hours. Coagulation Profile: Recent Labs  Lab 05/19/23 1416  INR 1.1   Cardiac Enzymes: No results for input(s): "CKTOTAL", "CKMB", "CKMBINDEX", "TROPONINI" in the last 168 hours. BNP (last 3 results) No results for input(s): "PROBNP" in the last 8760 hours. HbA1C: No results for input(s): "HGBA1C" in the last 72 hours. CBG: Recent Labs  Lab 05/24/23 0721 05/24/23 1142 05/24/23 1621 05/24/23 2146 05/25/23 0727  GLUCAP 136* 88 96 121* 86   Lipid Profile: No results for input(s): "CHOL", "HDL", "LDLCALC", "TRIG", "CHOLHDL", "LDLDIRECT" in the last 72 hours. Thyroid Function Tests: No results for input(s): "TSH", "T4TOTAL", "FREET4", "T3FREE", "THYROIDAB" in the last 72 hours. Anemia Panel: No results for input(s): "VITAMINB12", "FOLATE", "FERRITIN", "TIBC", "IRON", "RETICCTPCT" in the last 72 hours. Sepsis Labs: No results for input(s): "PROCALCITON", "LATICACIDVEN" in the last 168 hours.  Recent Results (from the past 240 hour(s))  MRSA Next Gen by PCR, Nasal  Status: None   Collection Time: 05/25/23 11:08 PM   Specimen: Nasal Mucosa; Nasal Swab   Result Value Ref Range Status   MRSA by PCR Next Gen NOT DETECTED NOT DETECTED Final    Comment: (NOTE) The GeneXpert MRSA Assay (FDA approved for NASAL specimens only), is one component of a comprehensive MRSA colonization surveillance program. It is not intended to diagnose MRSA infection nor to guide or monitor treatment for MRSA infections. Test performance is not FDA approved in patients less than 15 years old. Performed at [facility], [address] [place], [place] [address]      Radiology Studies: [facility] Chest Port 1 View  Result Date: 05/25/2023 CLINICAL DATA:  Shortness of breath, hypoxia EXAM: PORTABLE CHEST 1 VIEW COMPARISON:  05/13/2023 FINDINGS: Complete opacification of the left hemithorax. Right lung clear. No acute bony abnormality. IMPRESSION: Complete opacification of the left hemithorax. Electronically Signed   By: Charlett Nose M.D.   On: 05/25/2023 23:43    Scheduled Meds:  acetaminophen  1,000 mg Oral Q8H   Chlorhexidine Gluconate Cloth  6 each Topical Daily   vitamin B-12  1,000 mcg Oral Daily   enoxaparin (LOVENOX) injection  80 mg Subcutaneous Q12H   feeding supplement  237 mL Oral BID BM   folic acid  1 mg Oral Daily   gabapentin  100 mg Oral BID   lidocaine  2 patch Transdermal Q24H   mouth rinse  15 mL Mouth Rinse 4 times per day   polyethylene glycol  17 g Oral Daily   senna-docusate  1 tablet Oral QHS   umeclidinium bromide  1 puff Inhalation Daily   Continuous Infusions:   LOS: 18 days   Hughie Closs, MD [facility]  05/26/2023, 12:00 PM   *Please note that this is a verbal dictation therefore any spelling or grammatical errors are due to the "Dragon Medical One" system interpretation.  Please page via Amion and do not message via secure chat for urgent patient care matters. Secure chat can be used for non urgent patient care matters.  How to contact the [facility] Attending or Consulting provider 7A - 7P or covering  provider during after hours 7P -7A, for this patient?  Check the care team in [facility] and look for a) attending/consulting [facility] provider listed and b) the [facility] team listed. Page or secure chat 7A-7P. Log into [email or web address] and use [place]'s universal password to access. If you do not have the password, please contact the hospital operator. Locate the [facility] provider you are looking for under [facility] and page to a number that you can be directly reached. If you still have difficulty reaching the provider, please page the [facility] (Director on Call) for the Hospitalists listed on amion for assistance.

## 2023-05-26 NOTE — Progress Notes (Signed)
Patient started to complain that she could not breathe. Informed hospitalist, vital signs were taken and patient's O2 sats were in the 60's; low 70's on 4 L of O2. I also informed respiratory and the rapid response nurse. NRB applied to patient with 15L of O2. Patient's O2 sats improved to low 90's. Patient was then transferred to ICU and report given to [facility], [place].  Patient's family informed of transport to ICU, all belongings were sent with patient.

## 2023-05-26 NOTE — Progress Notes (Signed)
PHARMACY - ANTICOAGULATION CONSULT NOTE  Pharmacy Consult for Lovenox Indication: DVT  Allergies  Allergen Reactions   Penicillins Swelling    Has taken cipro with no problems    Patient Measurements: Height: 5\' 5"  (165.1 cm) Weight: 86.4 kg (190 lb 7.6 oz) IBW/kg (Calculated) : 57  Vital Signs: Temp: 98.5 F (36.9 C) (10/26 0839) Temp Source: Oral (10/26 0839) Pulse Rate: 92 (10/26 0812)  Labs: Recent Labs    05/24/23 0928 05/25/23 0619  HGB 10.3*  --   HCT 33.8*  --   PLT 392  --   CREATININE 1.04* 1.03*    Estimated Creatinine Clearance: 52.8 mL/min (A) (by C-G formula based on SCr of 1.03 mg/dL (H)).   Medical History: Past Medical History:  Diagnosis Date   Diabetes mellitus without complication (HCC)    Encounter for hepatitis C screening test for low risk patient 08/30/2019   Encounter for imaging to assess osteoporosis 08/30/2019   Encounter for screening mammogram for malignant neoplasm of breast 08/30/2019   Hyperlipidemia    Hypertension    Type 2 diabetes mellitus with neurological complications (HCC) 08/30/2019   Assessment: AC/Heme: Lovenox 1mg /kg q12h (DVT +). Hgb down to 10.3. Plts WNL  Goal of Therapy:  Anti-Xa level 0.6-1 units/ml 4hrs after LMWH dose given Monitor platelets by anticoagulation protocol: Yes   Plan:  Lovenox 80mg  SQ BID CBC q72h while on LMWH   Madison Ho S. Merilynn Finland, PharmD, BCPS Clinical Staff Pharmacist [email or web address] Merilynn Finland, [employer] 05/26/2023,11:49 AM

## 2023-05-26 NOTE — Progress Notes (Addendum)
NAME:  Madison Ho, MRN:  [id number], DOB:  05-03-1951, LOS: 18 ADMISSION DATE:  05/07/2023, CONSULTATION DATE:  05/26/23 REFERRING MD:  Dr Hughie Closs, CHIEF COMPLAINT:  Wosening resp failure and left lung white out on imaging    History of Present Illness: - hx from chart review and d/w Dr Pollie Meyer  72 year old obese female with standard past medical history of hypertension, hyperlipidemia, type 2 diabetes with neuropathy and previous heavy smoking quit 6/8 weeks prior to admission.  Admitted on 05/07/2023 with failure to thrive 50 pound unintentional weight loss, poor appetite and left shoulder pain all over 4-8 weeks associated with gait imbalance.  CT at admission consistent with stage IV non-small cell lung cancer new diagnosis (CT 05/08/23: Entire left upper lobe mass 17 cm x 13 times x 15 encasing the left pulmonary artery with mass effect and narrowing, abuts the distal trachea and encases the left mainstem bronchus and occluding the left upper lobe bronchus associated with necrotic changes and gas.  There is also extension to the left chest wall with extension of the mass into the intercostal musculature in the left axilla and subpectoral tissues.  There is also an anterior mediastinal 3 cm mass all associated with 1st-4th rib left-sided destruction. There is compression of the left supraclinoid vein by the chest wall mass. Also 3.2cm Rt Adrenal mass).  Other diagnosis included possible UTI, AKI uncontrolled pain and compression fractures of the T9  Course in the hospital - 05/08/2023 critical care medicine consult: Recommended biopsy and addressing goals of care -> needing oxygen therapy -05/09/2023 underwent CT-guided left upper lobe chest wall mass biopsy > biopsy which features of poorly differentiated squamous cell carcinoma  -Sepsis features of lactic acid and SIRS improved and also kidney injury -05/11/2023: Deemed moderate fall risk because of witnessed fall in the  hospital  -Oncology consult and MRI of the brain recommended along with foundation 1 testing and goals of care  -Diagnosis stage IV [id number] squamous cell carcinoma  -PD-L1 sent -05/12/2023: MRI brain solitary 8 mm intracranial met without cerebral edema  -Steroids not started -05/13/2023: Requiring 2.5 L oxygen  -05/14/2023: Noted to have aspiration like symptoms and little bit somnolent oncology goals of care recommended DNR.  Family not ready for DNR.   -Respiratory status decline now requiring 3 L oxygen -Demonstrate well aspiration   - Lethargy and delirium  -05/15/2023: Radiation oncology consult  -Goals of her to go to SNF upon discharge  -Plan for radiation to the brain and to the lung  -Oncology consult signed off  -  -05/16/2023  -Modified barium swallow evaluation: Dysphagia 1 nectar thick liquids recommended with sitting upright after meals  -More alert.  Less delirium  -Continued goals of care discussion  -05/17/2023  -More alert but having significant left arm swelling.  Also deconditioned.  -Started radiation [goal 10 sessions] but did not complete due to left upper extremity pain  -Left upper extremity DVT diagnosed and therapeutic Lovenox started  -05/20/2023   -Has completed only 1 radiation.  Now requiring morphine as needed for left upper extremity pain.  Aunt treatment dose Lovenox.  On carbohydrate diet after improvement in mental status improved mentation.  Creatinine now baseline  -05/22/2023  -Fully oriented.  Ongoing Lidoderm Tylenol Ultram and morphine as needed's for pain  - -Ongoing palliative care discussions.  Still full code.  -05/24/2023   -Seen by speech and now recommended dysphagia 3 mechanical soft diet  - 05/25/23  -Family is chosen [facility] at discharge  -Remains full code per palliative care  -Did undergo radiation  - 05/26/23 Late night 05/25/2023 -increasing oxygen needs from 4 L to requiring 15 L via  nasal cannula along with respiratory distress and moved from medical floor/progressive to stepdown status and 2 W[facility] long hospitalist was concerned about aspiration.  Chest x-ray showed complete left lung opacification there is definitely worse compared to 05/08/2023 and possibly worse since 05/13/2023 and pulmonary medicine consulted.   PAST      has a past medical history of Diabetes mellitus without complication (HCC), Encounter for hepatitis C screening test for low risk patient (08/30/2019), Encounter for imaging to assess osteoporosis (08/30/2019), Encounter for screening mammogram for malignant neoplasm of breast (08/30/2019), Hyperlipidemia, Hypertension, and Type 2 diabetes mellitus with neurological complications (HCC) (08/30/2019).   reports that she has been smoking. She has never used smokeless tobacco.  Past Surgical History:  Procedure Laterality Date   EYE SURGERY N/A    [employer] 11/10/2020   LEG SURGERY     TUBAL LIGATION      Allergies  Allergen Reactions   Penicillins Swelling    Has taken cipro with no problems     There is no immunization history on file for this patient.  Family History  Problem Relation Age of Onset   Stroke Mother    Heart attack Mother    Heart attack Father      Current Facility-Administered Medications:    acetaminophen (TYLENOL) tablet 1,000 mg, 1,000 mg, Oral, Q8H, Samtani, Jai-Gurmukh, MD, 1,000 mg at 05/26/23 4401   albuterol (PROVENTIL) (2.5 MG/3ML) 0.083% nebulizer solution 2.5 mg, 2.5 mg, Nebulization, Q2H PRN, Rhetta Mura, MD   Chlorhexidine Gluconate Cloth 2 % PADS 6 each, 6 each, Topical, Daily, Pahwani, Ravi, MD   cyanocobalamin (VITAMIN B12) tablet 1,000 mcg, 1,000 mcg, Oral, Daily, Mahala Menghini, Jai-Gurmukh, MD, 1,000 mcg at 05/25/23 0937   enoxaparin (LOVENOX) injection 80 mg, 80 mg, Subcutaneous, Q12H, Cherylin Mylar, RPH, 80 mg at 05/26/23 0411   feeding supplement (ENSURE ENLIVE / ENSURE PLUS) liquid 237 mL, 237  mL, Oral, BID BM, Mahala Menghini, Jai-Gurmukh, MD, 237 mL at 05/25/23 1508   folic acid (FOLVITE) tablet 1 mg, 1 mg, Oral, Daily, Mahala Menghini, Jai-Gurmukh, MD, 1 mg at 05/25/23 0272   food thickener (SIMPLYTHICK (NECTAR/LEVEL 2/MILDLY THICK)) 1 packet, 1 packet, Oral, PRN, Hughie Closs, MD   gabapentin (NEURONTIN) capsule 100 mg, 100 mg, Oral, BID, Mahala Menghini, Jai-Gurmukh, MD, 100 mg at 05/25/23 2129   lidocaine (LIDODERM) 5 % 2 patch, 2 patch, Transdermal, Q24H, Rhetta Mura, MD, 2 patch at 05/25/23 0937   morphine (PF) 2 MG/ML injection 1 mg, 1 mg, Intravenous, Q2H PRN, Shalhoub, Deno Lunger, MD, 1 mg at 05/25/23 1703   ondansetron (ZOFRAN) tablet 4 mg, 4 mg, Oral, Q6H PRN **OR** ondansetron (ZOFRAN) injection 4 mg, 4 mg, Intravenous, Q6H PRN, Mahala Menghini, Jai-Gurmukh, MD, 4 mg at 05/23/23 1105   Oral care mouth rinse, 15 mL, Mouth Rinse, 4 times per day, Tyrone Nine, MD, 15 mL at 05/25/23 2356   Oral care mouth rinse, 15 mL, Mouth Rinse, PRN, Hazeline Junker B, MD   polyethylene glycol (MIRALAX / GLYCOLAX) packet 17 g, 17 g, Oral, Daily, Samtani, Jai-Gurmukh, MD, 17 g at 05/25/23 5366   senna-docusate (Senokot-S) tablet 1 tablet, 1 tablet, Oral, QHS, Samtani, Jai-Gurmukh, MD, 1 tablet at 05/25/23 2129   sodium chloride (OCEAN) 0.65 % nasal spray 1 spray, 1 spray, Each Nare, PRN, Pahwani,  Daleen Bo, MD   traMADol Janean Sark) tablet 50 mg, 50 mg, Oral, Q6H PRN, Wynne Dust A, NP, 50 mg at 05/26/23 0410   triamcinolone 0.1 % cream : eucerin cream, 1:1, , Topical, TID PRN, Rhetta Mura, MD, 0.1 Application at 05/11/23 2253   umeclidinium bromide (INCRUSE ELLIPTA) 62.5 MCG/ACT 1 puff, 1 puff, Inhalation, Daily, Rhetta Mura, MD, 1 puff at 05/26/23 0905     Significant Hospital Events:  05/07/2023 - admit   Interim History / Subjective:   05/26/2023 - seen in bed [address].  Has been afebrile since at least 05/13/2023 with a normal white count  Objective   Blood pressure 101/67, pulse 92,  temperature 98.5 F (36.9 C), temperature source Oral, resp. rate 13, height 5\' 5"  (1.651 m), weight 86.4 kg, SpO2 97%.        Intake/Output Summary (Last 24 hours) at 05/26/2023 0948 Last data filed at 05/25/2023 1738 Gross per 24 hour  Intake 240 ml  Output 450 ml  Net -210 ml   Filed Weights   05/21/23 0553 05/22/23 0428 05/23/23 0500  Weight: 86.5 kg 86.7 kg 86.4 kg    Examination: General: obese lady lyng in bed. On 15 LNC HENT: Mallampatti class 3. On 15 LNC Lungs: ALMOST NO AIR ENTRY on LEFFT CHEST - LARGE MASS PALPABLE on LEFT INFRACLAV AREA Cardiovascular: NORMAL HEART SOUND   Abdomen: soft Extremities: intact. LUE SWELLING Neuro: AxOx3 GU: not examined  Resolved Hospital Problem list   x  Assessment & Plan:    PULMONARY  A:  Acute respiratory failure present on admission secondary to large left upper lobe lung mass stage IV non-small cell lung cancer  -with destructive features such as chest wall destruction rib destruction, occlusion of the left main bronchus and left upper lobe bronchus and encasement of pulmonary artery  -Started palliative radiation 05/17/2023  -Dramatic worsening in hypoxemia and left chest x-ray imaging features with whiteout worsening on 05/25/2023 and 05/26/2023   05/26/2023 -> 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  Aspiration another consideration but white count and fever have been normal.  Pulmonary embolism unlikely because she is already on treatment dose Lovenox  P:   Get CT scan of the chest without contrast -  [doubt PE because she is on treatment dose Lovenox for left upper extremity DVT since 05/17/2023]  Continue palliative support  -Pain control and oxygen therapy for pulse ox goal greater than 92%  Doubt if any specific actionable intervention can reverse or improve on the hypoxemia  Get echocardiogram  Can consider BiPAP for respiratory distress for palliative  care relief of dyspnea and to avoid intubations  Intubate if she gets worse but she will not survive the intubation [DO NOT INTUBATE recommended]  NEUROLOGIC A:   Solitary brain met diagnosed at admission 8 mm without any mass effect.  Undergoing radiation therapy since 05/17/2023  Severe pain secondary to cancer on opioids  P:   Per hospitalist service and radiation services    VASCULAR A:   Encasement of the pulmonary artery by lung cancer  -   P:  Get echocardiogram to evaluate right ventricular function [prognostic information]  CARDIAC STRUCTURAL A: At high risk for pericardial effusion and tamponade  -   P: Get echocardiogram   MSK/DERM Failure to thrive Deconditioning Present since admission    Best practice (daily eval):  According to the hospitalist  Goals of Care:   Palliative care and hospitalist and oncology  and radiation oncology all having conversations   ATTESTATION & SIGNATURE   The patient Madison Ho is critically ill with multiple organ systems failure and requires high complexity decision making for assessment and support, frequent evaluation and titration of therapies, application of advanced monitoring technologies and extensive interpretation of multiple databases.   Critical Care Time devoted to patient care services described in this note is  30  Minutes. This time reflects time of care of this signee Dr Kalman Shan. This critical care time does not reflect procedure time, or teaching time or supervisory time of PA/NP/Med student/Med Resident etc but could involve care discussion time      SIGNATURE    Dr. Kalman Shan, M.D., F.C.C.P,  Pulmonary and Critical Care Medicine Staff Physician, [facility] Director - Interstitial Lung Disease  Program  Pulmonary Fibrosis [facility] [place], [place], [address]  NPI Number:  NPI #[id number]  Pager: [phone], If no  answer  -> Check AMION or Try [phone] Telephone (clinical office): [phone] Telephone (research): [phone]  9:48 AM 05/26/2023   05/26/2023 9:48 AM    LABS    PULMONARY Recent Labs  Lab 05/25/23 2327  HCO3 35.3*  O2SAT 89.3    CBC Recent Labs  Lab 05/19/23 1416 05/24/23 0928  HGB 11.3* 10.3*  HCT 38.0 33.8*  WBC 11.4* 8.5  PLT 397 392    COAGULATION Recent Labs  Lab 05/19/23 1416  INR 1.1    CARDIAC  No results for input(s): "TROPONINI" in the last 168 hours. No results for input(s): "PROBNP" in the last 168 hours.  CHEMISTRY Recent Labs  Lab 05/19/23 1416 05/24/23 0928 05/25/23 0619  NA 132* 134* 134*  K 5.5* 5.3* 4.4  CL 91* 93* 91*  CO2 30 29 31   GLUCOSE 106* 121* 91  BUN 37* 39* 39*  CREATININE 1.07* 1.04* 1.03*  CALCIUM 9.0 9.2 9.4  MG  --  2.0  --    Estimated Creatinine Clearance: 52.8 mL/min (A) (by C-G formula based on SCr of 1.03 mg/dL (H)).   LIVER Recent Labs  Lab 05/19/23 1416  AST 22  ALT 14  ALKPHOS 52  BILITOT 0.4  PROT 6.2*  ALBUMIN 2.3*  INR 1.1     INFECTIOUS No results for input(s): "LATICACIDVEN", "PROCALCITON" in the last 168 hours.   ENDOCRINE CBG (last 3)  Recent Labs    05/24/23 1621 05/24/23 2146 05/25/23 0727  GLUCAP 96 121* 86         IMAGING x48h  - image(s) personally visualized  -   highlighted in bold DG Chest Port 1 View  Result Date: 05/25/2023 CLINICAL DATA:  Shortness of breath, hypoxia EXAM: PORTABLE CHEST 1 VIEW COMPARISON:  05/13/2023 FINDINGS: Complete opacification of the left hemithorax. Right lung clear. No acute bony abnormality. IMPRESSION: Complete opacification of the left hemithorax. Electronically Signed   By: Charlett Nose M.D.   On: 05/25/2023 23:43

## 2023-05-27 ENCOUNTER — Inpatient Hospital Stay (HOSPITAL_COMMUNITY): Payer: Medicare PPO

## 2023-05-27 DIAGNOSIS — Z515 Encounter for palliative care: Secondary | ICD-10-CM | POA: Diagnosis not present

## 2023-05-27 DIAGNOSIS — Z79899 Other long term (current) drug therapy: Secondary | ICD-10-CM

## 2023-05-27 DIAGNOSIS — I82A12 Acute embolism and thrombosis of left axillary vein: Secondary | ICD-10-CM | POA: Diagnosis not present

## 2023-05-27 DIAGNOSIS — C3492 Malignant neoplasm of unspecified part of left bronchus or lung: Secondary | ICD-10-CM | POA: Diagnosis not present

## 2023-05-27 DIAGNOSIS — C3490 Malignant neoplasm of unspecified part of unspecified bronchus or lung: Secondary | ICD-10-CM | POA: Diagnosis not present

## 2023-05-27 DIAGNOSIS — R0603 Acute respiratory distress: Secondary | ICD-10-CM

## 2023-05-27 DIAGNOSIS — Z7189 Other specified counseling: Secondary | ICD-10-CM

## 2023-05-27 LAB — ECHOCARDIOGRAM COMPLETE
AR max vel: 2.26 cm2
AV Peak grad: 6.5 mm[Hg]
Ao pk vel: 1.27 m/s
Area-P 1/2: 3.63 cm2
Height: 65 in
S' Lateral: 3.3 cm
Weight: 3047.64 [oz_av]

## 2023-05-27 LAB — PROCALCITONIN: Procalcitonin: 0.17 ng/mL

## 2023-05-27 MED ORDER — LIDOCAINE HCL 1 % IJ SOLN
INTRAMUSCULAR | Status: AC
Start: 2023-05-27 — End: ?
  Filled 2023-05-27: qty 20

## 2023-05-27 MED ORDER — MORPHINE SULFATE 10 MG/5ML PO SOLN
2.5000 mg | ORAL | Status: DC | PRN
  Administered 2023-05-28 (×2): 5 mg via ORAL
  Filled 2023-05-27 (×2): qty 5

## 2023-05-27 MED ORDER — DEXAMETHASONE 2 MG PO TABS
4.0000 mg | ORAL_TABLET | Freq: Every day | ORAL | Status: DC
  Administered 2023-05-28: 4 mg via ORAL
  Filled 2023-05-27: qty 2

## 2023-05-27 MED ORDER — DEXAMETHASONE SODIUM PHOSPHATE 10 MG/ML IJ SOLN
8.0000 mg | Freq: Once | INTRAMUSCULAR | Status: AC
  Administered 2023-05-27: 8 mg via INTRAVENOUS
  Filled 2023-05-27: qty 1

## 2023-05-27 MED ORDER — PERFLUTREN LIPID MICROSPHERE
1.0000 mL | INTRAVENOUS | Status: AC | PRN
Start: 2023-05-27 — End: 2023-05-27
  Administered 2023-05-27: 5 mL via INTRAVENOUS

## 2023-05-27 NOTE — Progress Notes (Signed)
Interventional Radiology Brief Note:  PA to bedside for thoracentesis.  Patient alert and oriented.  Needs prompting at times, but arouses and is agreeable to proceed.  Unfortunately, she is unable to maintain rolled position for procedure.  She requests to stop.  [place] [employer] Chest saved for reference.   Loyce Dys, MS RD PA-C

## 2023-05-27 NOTE — Plan of Care (Signed)
  Problem: Fluid Volume: Goal: Ability to maintain a balanced intake and output will improve Outcome: Progressing   Problem: Nutritional: Goal: Maintenance of adequate nutrition will improve Outcome: Progressing   Problem: Nutrition: Goal: Adequate nutrition will be maintained Outcome: Progressing   Problem: Elimination: Goal: Will not experience complications related to urinary retention Outcome: Progressing   Problem: Bowel/Gastric: Goal: Occurrences of nausea, vomiting, and/or diarrhea will decrease Outcome: Progressing

## 2023-05-27 NOTE — Progress Notes (Signed)
[facility] [address] [facility] Liaison Note  Received referral from Dr. Alvester Morin with Palliative Care regarding patient/family request for hospice services at home at discharge.  Went by patient room and patient was sleeping and scheduled for bedside thoracentesis.  Bedside RN asked that I call patient's daughter, Babette Relic, to discuss.  Called Tammy on phone and educated her regarding hospice philosophy, services and team approach to care.  Tammy expressed desire to move forward with referral based on patient wishes and is in the process of rearranging furniture at home to make room for needed equipment.  Family confirmed patient has bedside commode at home.  Request for hospital bed, overbed table, 15L HFNC oxygen delivery and hoyer lift if possible be delivered prior to patient discharging.  Tammy aware that equipment company will call her tomorrow to discuss delivery of equipment.  Patient for possible discharge once equipment delivered as long as patient medically stable.  Tammy was given [facility] contact numbers in case questions arise prior to discharge or after discharge.  All questions answered during discussion.  Thank you for the opportunity to participate in this patient's care.  Please call for any hospice related questions or concerns.  Doreatha Martin, RN, [facility] [phone]

## 2023-05-27 NOTE — Progress Notes (Signed)
PROGRESS NOTE    Madison Ho  ZOX:[id number] DOB: [birth year] DOA: 05/07/2023 PCP: Ignatius Specking, MD   Brief Narrative:  72 year old female with past medical history of hypertension, hyperlipidemia, diabetes mellitus type 2, COPD and neuropathy who presented to [facility] emergency department with left arm and shoulder pain 40 pound weight loss and failure to thrive.   Patient was admitted to hospital service. Workup included CT imaging which revealed a large left lung mass concerning for primary lung malignancy.  Biopsy of left upper lobe mass on 10/9 was consistent with squamous cell lung cancer showing local invasion, right adrenal metastases and a solitary 8 mm intracranial metastasis on MRI.  Dr. Cherly Hensen with Medical Oncology was consulted as well as radiation oncology.  Patient was transferred to [facility] for initiation of palliative XRT, 10 fractions of palliative chest radiation.  Palliative care is also been consulted for their assistance.     Hospital course has had multiple complications including an episode of observed aspiration on 10/14.  Patient was briefly made n.p.o. and speech therapy evaluation was carried out followed by modified barium swallow on 10/16.  An appropriate dysphagia 2 diet was then ordered.  Additionally patient developed a left upper extremity DVT identified on ultrasound on 10/17 for which Lovenox was initiated.   Considering associated debility and weakness patient is anticipated to go to a skilled nursing facility upon discharge.   Assessment & Plan:   Active Problems:   Squamous cell lung cancer, left (HCC)   Acute deep vein thrombosis (DVT) of axillary vein of left upper extremity (HCC)   Left shoulder pain   Dysphagia   Toxic metabolic encephalopathy   Acute renal failure superimposed on stage 3a chronic kidney disease (HCC)   Type 2 diabetes mellitus with stage 3a chronic kidney disease, without long-term current use of insulin  (HCC)   Folate deficiency   Goals of care, counseling/discussion   Sepsis secondary to UTI (HCC)   Nicotine dependence, cigarettes, uncomplicated   Compression fracture of body of thoracic vertebra (HCC)   Mediastinal mass   Mass of thoracic structure   DNR (do not resuscitate) discussion   Non-small cell lung cancer metastatic to adrenal gland (HCC)   Metastasis to brain (HCC)   Normocytic anemia   Low serum vitamin B12   Pressure injury of skin   Acute respiratory failure with hypoxia (HCC)   Medication management   Palliative care encounter  Acute hypoxic respiratory failure secondary to worsening squamous cell lung cancer, left (HCC) Identification of large left lung mass upon this admission determined to be squamous cell lung cancer. Mass is substantial in size measuring 13 x 15 cm x 17 cm with encasement of the left main pulmonary artery with mass effect as well as abutting the distal trachea, encasing the left mainstem bronchus, occluding the left upper lobe bronchus and abutting the thoracic aorta. Started radiation therapy.  She received couple of sessions, plan was to do 10 sessions but patient got sicker with acute hypoxic respiratory failure and left pleural effusion on 05/25/2023, transferred to stepdown, chest x-ray showed complete opacification of the left lung, PCCM consulted, CT chest shows enlarging mass.  Long discussion with son and daughter and patient by me, PCCM and palliative care, transition to comfort care as patient does not want to be hooked up to machines.  She has now excepted the reality of her expanding cancer and prefers to go home with hospice and see her cat.  PROGRESS NOTE    Madison Ho  ZOX:[id number] DOB: [birth year] DOA: 05/07/2023 PCP: Ignatius Specking, MD   Brief Narrative:  72 year old female with past medical history of hypertension, hyperlipidemia, diabetes mellitus type 2, COPD and neuropathy who presented to [facility] emergency department with left arm and shoulder pain 40 pound weight loss and failure to thrive.   Patient was admitted to hospital service. Workup included CT imaging which revealed a large left lung mass concerning for primary lung malignancy.  Biopsy of left upper lobe mass on 10/9 was consistent with squamous cell lung cancer showing local invasion, right adrenal metastases and a solitary 8 mm intracranial metastasis on MRI.  Dr. Cherly Hensen with Medical Oncology was consulted as well as radiation oncology.  Patient was transferred to [facility] for initiation of palliative XRT, 10 fractions of palliative chest radiation.  Palliative care is also been consulted for their assistance.     Hospital course has had multiple complications including an episode of observed aspiration on 10/14.  Patient was briefly made n.p.o. and speech therapy evaluation was carried out followed by modified barium swallow on 10/16.  An appropriate dysphagia 2 diet was then ordered.  Additionally patient developed a left upper extremity DVT identified on ultrasound on 10/17 for which Lovenox was initiated.   Considering associated debility and weakness patient is anticipated to go to a skilled nursing facility upon discharge.   Assessment & Plan:   Active Problems:   Squamous cell lung cancer, left (HCC)   Acute deep vein thrombosis (DVT) of axillary vein of left upper extremity (HCC)   Left shoulder pain   Dysphagia   Toxic metabolic encephalopathy   Acute renal failure superimposed on stage 3a chronic kidney disease (HCC)   Type 2 diabetes mellitus with stage 3a chronic kidney disease, without long-term current use of insulin  (HCC)   Folate deficiency   Goals of care, counseling/discussion   Sepsis secondary to UTI (HCC)   Nicotine dependence, cigarettes, uncomplicated   Compression fracture of body of thoracic vertebra (HCC)   Mediastinal mass   Mass of thoracic structure   DNR (do not resuscitate) discussion   Non-small cell lung cancer metastatic to adrenal gland (HCC)   Metastasis to brain (HCC)   Normocytic anemia   Low serum vitamin B12   Pressure injury of skin   Acute respiratory failure with hypoxia (HCC)   Medication management   Palliative care encounter  Acute hypoxic respiratory failure secondary to worsening squamous cell lung cancer, left (HCC) Identification of large left lung mass upon this admission determined to be squamous cell lung cancer. Mass is substantial in size measuring 13 x 15 cm x 17 cm with encasement of the left main pulmonary artery with mass effect as well as abutting the distal trachea, encasing the left mainstem bronchus, occluding the left upper lobe bronchus and abutting the thoracic aorta. Started radiation therapy.  She received couple of sessions, plan was to do 10 sessions but patient got sicker with acute hypoxic respiratory failure and left pleural effusion on 05/25/2023, transferred to stepdown, chest x-ray showed complete opacification of the left lung, PCCM consulted, CT chest shows enlarging mass.  Long discussion with son and daughter and patient by me, PCCM and palliative care, transition to comfort care as patient does not want to be hooked up to machines.  She has now excepted the reality of her expanding cancer and prefers to go home with hospice and see her cat.  WBC 8.5  NEUTROABS 6.4  HGB 10.3*  HCT 33.8*  MCV 100.3*  PLT 392   Basic Metabolic Panel: Recent Labs  Lab 05/24/23 0928 05/25/23 0619  NA 134* 134*  K 5.3* 4.4  CL 93* 91*  CO2 29 31  GLUCOSE 121* 91  BUN 39* 39*  CREATININE 1.04* 1.03*  CALCIUM 9.2 9.4  MG 2.0  --    GFR: Estimated Creatinine Clearance: 52.8 mL/min (A) (by C-G formula based on SCr of 1.03 mg/dL (H)). Liver Function Tests: No results for input(s): "AST", "ALT", "ALKPHOS", "BILITOT", "PROT", "ALBUMIN" in the last 168 hours.  No results for input(s): "LIPASE", "AMYLASE" in the last 168 hours. No results for input(s): "AMMONIA" in the last 168 hours. Coagulation Profile: No results for input(s): "INR", "PROTIME" in the last 168 hours.  Cardiac Enzymes: No results for input(s): "CKTOTAL", "CKMB", "CKMBINDEX", "TROPONINI" in the last 168 hours. BNP (last 3 results) No results for input(s): "PROBNP" in the last 8760 hours. HbA1C: No results for input(s): "HGBA1C" in the last 72 hours. CBG: Recent Labs  Lab 05/24/23 0721 05/24/23 1142 05/24/23 1621 05/24/23 2146 05/25/23 0727  GLUCAP 136* 88 96 121* 86   Lipid Profile: No results for input(s): "CHOL", "HDL", "LDLCALC", "TRIG", "CHOLHDL", "LDLDIRECT" in the last 72 hours. Thyroid Function Tests: No results for input(s): "TSH", "T4TOTAL", "FREET4", "T3FREE", "THYROIDAB" in the last 72 hours. Anemia Panel: No results for  input(s): "VITAMINB12", "FOLATE", "FERRITIN", "TIBC", "IRON", "RETICCTPCT" in the last 72 hours. Sepsis Labs: Recent Labs  Lab 05/27/23 0308  PROCALCITON 0.17    Recent Results (from the past 240 hour(s))  MRSA Next Gen by PCR, Nasal     Status: None   Collection Time: 05/25/23 11:08 PM   Specimen: Nasal Mucosa; Nasal Swab  Result Value Ref Range Status   MRSA by PCR Next Gen NOT DETECTED NOT DETECTED Final    Comment: (NOTE) The GeneXpert MRSA Assay (FDA approved for NASAL specimens only), is one component of a comprehensive MRSA colonization surveillance program. It is not intended to diagnose MRSA infection nor to guide or monitor treatment for MRSA infections. Test performance is not FDA approved in patients less than 39 years old. Performed at [facility], [address], [place], [place] [address]      Radiology Studies: ECHOCARDIOGRAM COMPLETE  Result Date: 05/27/2023    ECHOCARDIOGRAM REPORT   Patient Name:   ADDYLYNN MUKES Date of Exam: 05/27/2023 Medical Rec #:  [id number]      Height:       65.0 in Accession #:    [id number]     Weight:       190.5 lb Date of Birth:  1951-01-16     BSA:          1.938 m Patient Age:    71 years       BP:           101/60 mmHg Patient Gender: F              HR:           92 bpm. Exam Location:  Inpatient Procedure: 2D Echo, Cardiac Doppler, Color Doppler and Intracardiac            Opacification Agent Indications:    Autre respiratory distress R06.03  History:        Patient has no prior history of Echocardiogram examinations.  PROGRESS NOTE    Madison Ho  ZOX:[id number] DOB: [birth year] DOA: 05/07/2023 PCP: Ignatius Specking, MD   Brief Narrative:  72 year old female with past medical history of hypertension, hyperlipidemia, diabetes mellitus type 2, COPD and neuropathy who presented to [facility] emergency department with left arm and shoulder pain 40 pound weight loss and failure to thrive.   Patient was admitted to hospital service. Workup included CT imaging which revealed a large left lung mass concerning for primary lung malignancy.  Biopsy of left upper lobe mass on 10/9 was consistent with squamous cell lung cancer showing local invasion, right adrenal metastases and a solitary 8 mm intracranial metastasis on MRI.  Dr. Cherly Hensen with Medical Oncology was consulted as well as radiation oncology.  Patient was transferred to [facility] for initiation of palliative XRT, 10 fractions of palliative chest radiation.  Palliative care is also been consulted for their assistance.     Hospital course has had multiple complications including an episode of observed aspiration on 10/14.  Patient was briefly made n.p.o. and speech therapy evaluation was carried out followed by modified barium swallow on 10/16.  An appropriate dysphagia 2 diet was then ordered.  Additionally patient developed a left upper extremity DVT identified on ultrasound on 10/17 for which Lovenox was initiated.   Considering associated debility and weakness patient is anticipated to go to a skilled nursing facility upon discharge.   Assessment & Plan:   Active Problems:   Squamous cell lung cancer, left (HCC)   Acute deep vein thrombosis (DVT) of axillary vein of left upper extremity (HCC)   Left shoulder pain   Dysphagia   Toxic metabolic encephalopathy   Acute renal failure superimposed on stage 3a chronic kidney disease (HCC)   Type 2 diabetes mellitus with stage 3a chronic kidney disease, without long-term current use of insulin  (HCC)   Folate deficiency   Goals of care, counseling/discussion   Sepsis secondary to UTI (HCC)   Nicotine dependence, cigarettes, uncomplicated   Compression fracture of body of thoracic vertebra (HCC)   Mediastinal mass   Mass of thoracic structure   DNR (do not resuscitate) discussion   Non-small cell lung cancer metastatic to adrenal gland (HCC)   Metastasis to brain (HCC)   Normocytic anemia   Low serum vitamin B12   Pressure injury of skin   Acute respiratory failure with hypoxia (HCC)   Medication management   Palliative care encounter  Acute hypoxic respiratory failure secondary to worsening squamous cell lung cancer, left (HCC) Identification of large left lung mass upon this admission determined to be squamous cell lung cancer. Mass is substantial in size measuring 13 x 15 cm x 17 cm with encasement of the left main pulmonary artery with mass effect as well as abutting the distal trachea, encasing the left mainstem bronchus, occluding the left upper lobe bronchus and abutting the thoracic aorta. Started radiation therapy.  She received couple of sessions, plan was to do 10 sessions but patient got sicker with acute hypoxic respiratory failure and left pleural effusion on 05/25/2023, transferred to stepdown, chest x-ray showed complete opacification of the left lung, PCCM consulted, CT chest shows enlarging mass.  Long discussion with son and daughter and patient by me, PCCM and palliative care, transition to comfort care as patient does not want to be hooked up to machines.  She has now excepted the reality of her expanding cancer and prefers to go home with hospice and see her cat.  PROGRESS NOTE    Madison Ho  ZOX:[id number] DOB: [birth year] DOA: 05/07/2023 PCP: Ignatius Specking, MD   Brief Narrative:  72 year old female with past medical history of hypertension, hyperlipidemia, diabetes mellitus type 2, COPD and neuropathy who presented to [facility] emergency department with left arm and shoulder pain 40 pound weight loss and failure to thrive.   Patient was admitted to hospital service. Workup included CT imaging which revealed a large left lung mass concerning for primary lung malignancy.  Biopsy of left upper lobe mass on 10/9 was consistent with squamous cell lung cancer showing local invasion, right adrenal metastases and a solitary 8 mm intracranial metastasis on MRI.  Dr. Cherly Hensen with Medical Oncology was consulted as well as radiation oncology.  Patient was transferred to [facility] for initiation of palliative XRT, 10 fractions of palliative chest radiation.  Palliative care is also been consulted for their assistance.     Hospital course has had multiple complications including an episode of observed aspiration on 10/14.  Patient was briefly made n.p.o. and speech therapy evaluation was carried out followed by modified barium swallow on 10/16.  An appropriate dysphagia 2 diet was then ordered.  Additionally patient developed a left upper extremity DVT identified on ultrasound on 10/17 for which Lovenox was initiated.   Considering associated debility and weakness patient is anticipated to go to a skilled nursing facility upon discharge.   Assessment & Plan:   Active Problems:   Squamous cell lung cancer, left (HCC)   Acute deep vein thrombosis (DVT) of axillary vein of left upper extremity (HCC)   Left shoulder pain   Dysphagia   Toxic metabolic encephalopathy   Acute renal failure superimposed on stage 3a chronic kidney disease (HCC)   Type 2 diabetes mellitus with stage 3a chronic kidney disease, without long-term current use of insulin  (HCC)   Folate deficiency   Goals of care, counseling/discussion   Sepsis secondary to UTI (HCC)   Nicotine dependence, cigarettes, uncomplicated   Compression fracture of body of thoracic vertebra (HCC)   Mediastinal mass   Mass of thoracic structure   DNR (do not resuscitate) discussion   Non-small cell lung cancer metastatic to adrenal gland (HCC)   Metastasis to brain (HCC)   Normocytic anemia   Low serum vitamin B12   Pressure injury of skin   Acute respiratory failure with hypoxia (HCC)   Medication management   Palliative care encounter  Acute hypoxic respiratory failure secondary to worsening squamous cell lung cancer, left (HCC) Identification of large left lung mass upon this admission determined to be squamous cell lung cancer. Mass is substantial in size measuring 13 x 15 cm x 17 cm with encasement of the left main pulmonary artery with mass effect as well as abutting the distal trachea, encasing the left mainstem bronchus, occluding the left upper lobe bronchus and abutting the thoracic aorta. Started radiation therapy.  She received couple of sessions, plan was to do 10 sessions but patient got sicker with acute hypoxic respiratory failure and left pleural effusion on 05/25/2023, transferred to stepdown, chest x-ray showed complete opacification of the left lung, PCCM consulted, CT chest shows enlarging mass.  Long discussion with son and daughter and patient by me, PCCM and palliative care, transition to comfort care as patient does not want to be hooked up to machines.  She has now excepted the reality of her expanding cancer and prefers to go home with hospice and see her cat.  WBC 8.5  NEUTROABS 6.4  HGB 10.3*  HCT 33.8*  MCV 100.3*  PLT 392   Basic Metabolic Panel: Recent Labs  Lab 05/24/23 0928 05/25/23 0619  NA 134* 134*  K 5.3* 4.4  CL 93* 91*  CO2 29 31  GLUCOSE 121* 91  BUN 39* 39*  CREATININE 1.04* 1.03*  CALCIUM 9.2 9.4  MG 2.0  --    GFR: Estimated Creatinine Clearance: 52.8 mL/min (A) (by C-G formula based on SCr of 1.03 mg/dL (H)). Liver Function Tests: No results for input(s): "AST", "ALT", "ALKPHOS", "BILITOT", "PROT", "ALBUMIN" in the last 168 hours.  No results for input(s): "LIPASE", "AMYLASE" in the last 168 hours. No results for input(s): "AMMONIA" in the last 168 hours. Coagulation Profile: No results for input(s): "INR", "PROTIME" in the last 168 hours.  Cardiac Enzymes: No results for input(s): "CKTOTAL", "CKMB", "CKMBINDEX", "TROPONINI" in the last 168 hours. BNP (last 3 results) No results for input(s): "PROBNP" in the last 8760 hours. HbA1C: No results for input(s): "HGBA1C" in the last 72 hours. CBG: Recent Labs  Lab 05/24/23 0721 05/24/23 1142 05/24/23 1621 05/24/23 2146 05/25/23 0727  GLUCAP 136* 88 96 121* 86   Lipid Profile: No results for input(s): "CHOL", "HDL", "LDLCALC", "TRIG", "CHOLHDL", "LDLDIRECT" in the last 72 hours. Thyroid Function Tests: No results for input(s): "TSH", "T4TOTAL", "FREET4", "T3FREE", "THYROIDAB" in the last 72 hours. Anemia Panel: No results for  input(s): "VITAMINB12", "FOLATE", "FERRITIN", "TIBC", "IRON", "RETICCTPCT" in the last 72 hours. Sepsis Labs: Recent Labs  Lab 05/27/23 0308  PROCALCITON 0.17    Recent Results (from the past 240 hour(s))  MRSA Next Gen by PCR, Nasal     Status: None   Collection Time: 05/25/23 11:08 PM   Specimen: Nasal Mucosa; Nasal Swab  Result Value Ref Range Status   MRSA by PCR Next Gen NOT DETECTED NOT DETECTED Final    Comment: (NOTE) The GeneXpert MRSA Assay (FDA approved for NASAL specimens only), is one component of a comprehensive MRSA colonization surveillance program. It is not intended to diagnose MRSA infection nor to guide or monitor treatment for MRSA infections. Test performance is not FDA approved in patients less than 39 years old. Performed at [facility], [address], [place], [place] [address]      Radiology Studies: ECHOCARDIOGRAM COMPLETE  Result Date: 05/27/2023    ECHOCARDIOGRAM REPORT   Patient Name:   ADDYLYNN MUKES Date of Exam: 05/27/2023 Medical Rec #:  [id number]      Height:       65.0 in Accession #:    [id number]     Weight:       190.5 lb Date of Birth:  1951-01-16     BSA:          1.938 m Patient Age:    71 years       BP:           101/60 mmHg Patient Gender: F              HR:           92 bpm. Exam Location:  Inpatient Procedure: 2D Echo, Cardiac Doppler, Color Doppler and Intracardiac            Opacification Agent Indications:    Autre respiratory distress R06.03  History:        Patient has no prior history of Echocardiogram examinations.  PROGRESS NOTE    Madison Ho  ZOX:[id number] DOB: [birth year] DOA: 05/07/2023 PCP: Ignatius Specking, MD   Brief Narrative:  72 year old female with past medical history of hypertension, hyperlipidemia, diabetes mellitus type 2, COPD and neuropathy who presented to [facility] emergency department with left arm and shoulder pain 40 pound weight loss and failure to thrive.   Patient was admitted to hospital service. Workup included CT imaging which revealed a large left lung mass concerning for primary lung malignancy.  Biopsy of left upper lobe mass on 10/9 was consistent with squamous cell lung cancer showing local invasion, right adrenal metastases and a solitary 8 mm intracranial metastasis on MRI.  Dr. Cherly Hensen with Medical Oncology was consulted as well as radiation oncology.  Patient was transferred to [facility] for initiation of palliative XRT, 10 fractions of palliative chest radiation.  Palliative care is also been consulted for their assistance.     Hospital course has had multiple complications including an episode of observed aspiration on 10/14.  Patient was briefly made n.p.o. and speech therapy evaluation was carried out followed by modified barium swallow on 10/16.  An appropriate dysphagia 2 diet was then ordered.  Additionally patient developed a left upper extremity DVT identified on ultrasound on 10/17 for which Lovenox was initiated.   Considering associated debility and weakness patient is anticipated to go to a skilled nursing facility upon discharge.   Assessment & Plan:   Active Problems:   Squamous cell lung cancer, left (HCC)   Acute deep vein thrombosis (DVT) of axillary vein of left upper extremity (HCC)   Left shoulder pain   Dysphagia   Toxic metabolic encephalopathy   Acute renal failure superimposed on stage 3a chronic kidney disease (HCC)   Type 2 diabetes mellitus with stage 3a chronic kidney disease, without long-term current use of insulin  (HCC)   Folate deficiency   Goals of care, counseling/discussion   Sepsis secondary to UTI (HCC)   Nicotine dependence, cigarettes, uncomplicated   Compression fracture of body of thoracic vertebra (HCC)   Mediastinal mass   Mass of thoracic structure   DNR (do not resuscitate) discussion   Non-small cell lung cancer metastatic to adrenal gland (HCC)   Metastasis to brain (HCC)   Normocytic anemia   Low serum vitamin B12   Pressure injury of skin   Acute respiratory failure with hypoxia (HCC)   Medication management   Palliative care encounter  Acute hypoxic respiratory failure secondary to worsening squamous cell lung cancer, left (HCC) Identification of large left lung mass upon this admission determined to be squamous cell lung cancer. Mass is substantial in size measuring 13 x 15 cm x 17 cm with encasement of the left main pulmonary artery with mass effect as well as abutting the distal trachea, encasing the left mainstem bronchus, occluding the left upper lobe bronchus and abutting the thoracic aorta. Started radiation therapy.  She received couple of sessions, plan was to do 10 sessions but patient got sicker with acute hypoxic respiratory failure and left pleural effusion on 05/25/2023, transferred to stepdown, chest x-ray showed complete opacification of the left lung, PCCM consulted, CT chest shows enlarging mass.  Long discussion with son and daughter and patient by me, PCCM and palliative care, transition to comfort care as patient does not want to be hooked up to machines.  She has now excepted the reality of her expanding cancer and prefers to go home with hospice and see her cat.

## 2023-05-27 NOTE — Progress Notes (Addendum)
NAME:  Madison Ho, MRN:  [id number], DOB:  02-05-1951, LOS: 19 ADMISSION DATE:  05/07/2023, CONSULTATION DATE:  05/26/23 REFERRING MD:  Dr Hughie Closs, CHIEF COMPLAINT:  Wosening resp failure and left lung white out on imaging    History of Present Illness: - hx from chart review and d/w Dr Pollie Meyer  72 year old obese female with standard past medical history of hypertension, hyperlipidemia, type 2 diabetes with neuropathy and previous heavy smoking quit 6/8 weeks prior to admission.  Admitted on 05/07/2023 with failure to thrive 50 pound unintentional weight loss, poor appetite and left shoulder pain all over 4-8 weeks associated with gait imbalance.  CT at admission consistent with stage IV non-small cell lung cancer new diagnosis (CT 05/08/23: Entire left upper lobe mass 17 cm x 13 times x 15 encasing the left pulmonary artery with mass effect and narrowing, abuts the distal trachea and encases the left mainstem bronchus and occluding the left upper lobe bronchus associated with necrotic changes and gas.  There is also extension to the left chest wall with extension of the mass into the intercostal musculature in the left axilla and subpectoral tissues.  There is also an anterior mediastinal 3 cm mass all associated with 1st-4th rib left-sided destruction. There is compression of the left supraclinoid vein by the chest wall mass. Also 3.2cm Rt Adrenal mass).  Other diagnosis included possible UTI, AKI uncontrolled pain and compression fractures of the T9  PAST    has a past medical history of Diabetes mellitus without complication (HCC), Encounter for hepatitis C screening test for low risk patient (08/30/2019), Encounter for imaging to assess osteoporosis (08/30/2019), Encounter for screening mammogram for malignant neoplasm of breast (08/30/2019), Hyperlipidemia, Hypertension, and Type 2 diabetes mellitus with neurological complications (HCC) (08/30/2019).   has a past surgical history that  includes Leg Surgery; Tubal ligation; and Eye surgery (N/A).   Significant Hospital Events:  05/07/2023 - admit  Course in the hospital - 05/08/2023 critical care medicine consult: Recommended biopsy and addressing goals of care -> needing oxygen therapy -05/09/2023 underwent CT-guided left upper lobe chest wall mass biopsy > biopsy which features of poorly differentiated squamous cell carcinoma  -Sepsis features of lactic acid and SIRS improved and also kidney injury -05/11/2023: Deemed moderate fall risk because of witnessed fall in the hospital  -Oncology consult and MRI of the brain recommended along with foundation 1 testing and goals of care  -Diagnosis stage IV [id number] squamous cell carcinoma  -PD-L1 sent -05/12/2023: MRI brain solitary 8 mm intracranial met without cerebral edema  -Steroids not started -05/13/2023: Requiring 2.5 L oxygen  -05/14/2023: Noted to have aspiration like symptoms and little bit somnolent oncology goals of care recommended DNR.  Family not ready for DNR.   -Respiratory status decline now requiring 3 L oxygen -Demonstrate well aspiration   - Lethargy and delirium  -05/15/2023: Radiation oncology consult  -Goals of her to go to SNF upon discharge  -Plan for radiation to the brain and to the lung  -Oncology consult signed off  -  -05/16/2023  -Modified barium swallow evaluation: Dysphagia 1 nectar thick liquids recommended with sitting upright after meals  -More alert.  Less delirium  -Continued goals of care discussion  -05/17/2023  -More alert but having significant left arm swelling.  Also deconditioned.  -Started radiation [goal 10 sessions] but did not complete due to left upper extremity pain  -Left upper extremity DVT diagnosed and therapeutic Lovenox started  -05/20/2023   -Has completed only  05/25/23 0727  GLUCAP 96 121* 86         IMAGING x48h  - image(s) personally visualized  -   highlighted in bold ECHOCARDIOGRAM COMPLETE  Result Date: 05/27/2023    ECHOCARDIOGRAM REPORT   Patient Name:   Madison Ho Date of Exam: 05/27/2023 Medical Rec #:  [id number]      Height:       65.0 in Accession #:    [id number]     Weight:       190.5 lb Date of Birth:  1951-02-22     BSA:          1.938 m Patient Age:    71 years       BP:           101/60 mmHg Patient Gender: F              HR:           92 bpm. Exam Location:  Inpatient Procedure: 2D Echo, Cardiac Doppler, Color Doppler and Intracardiac            Opacification Agent Indications:    Autre respiratory distress R06.03  History:        Patient has no prior history of Echocardiogram examinations.                 Risk Factors:Hypertension, Diabetes and Dyslipidemia.  Sonographer:    Lucendia Herrlich RCS Referring Phys: [phone] [facility]  Sonographer Comments: Image acquisition challenging due to uncooperative patient. IMPRESSIONS  1. Left ventricular ejection fraction, by estimation, is 60 to 65%. The left ventricle has normal function. The left ventricle has no regional wall motion abnormalities.  Left ventricular diastolic parameters are consistent with Grade I diastolic dysfunction (impaired relaxation).  2. Right ventricular systolic function is normal. The right ventricular size is normal. There is normal pulmonary artery systolic pressure. The estimated right ventricular systolic pressure is 20.3 mmHg.  3. The mitral valve is grossly normal. Trivial mitral valve regurgitation. No evidence of mitral stenosis.  4. The aortic valve is tricuspid. Aortic valve regurgitation is not visualized. No aortic stenosis is present.  5. The inferior vena cava is normal in size with greater than 50% respiratory variability, suggesting right atrial pressure of 3 mmHg. FINDINGS  Left Ventricle: Left ventricular ejection fraction, by estimation, is 60 to 65%. The left ventricle has normal function. The left ventricle has no regional wall motion abnormalities. Definity contrast agent was given IV to delineate the left ventricular  endocardial borders. The left ventricular internal cavity size was normal in size. There is no left ventricular hypertrophy. Left ventricular diastolic parameters are consistent with Grade I diastolic dysfunction (impaired relaxation). Right Ventricle: The right ventricular size is normal. No increase in right ventricular wall thickness. Right ventricular systolic function is normal. There is normal pulmonary artery systolic pressure. The tricuspid regurgitant velocity is 2.08 m/s, and  with an assumed right atrial pressure of 3 mmHg, the estimated right ventricular systolic pressure is 20.3 mmHg. Left Atrium: Left atrial size was normal in size. Right Atrium: Right atrial size was normal in size. Pericardium: There is no evidence of pericardial effusion. Presence of epicardial fat layer. Mitral Valve: The mitral valve is grossly normal. Trivial mitral valve regurgitation. No evidence of mitral valve stenosis. Tricuspid Valve: The tricuspid valve is grossly normal. Tricuspid valve regurgitation is  trivial. No evidence of tricuspid stenosis. Aortic Valve: The aortic valve is tricuspid. Aortic valve regurgitation is not visualized. No  05/25/23 0727  GLUCAP 96 121* 86         IMAGING x48h  - image(s) personally visualized  -   highlighted in bold ECHOCARDIOGRAM COMPLETE  Result Date: 05/27/2023    ECHOCARDIOGRAM REPORT   Patient Name:   Madison Ho Date of Exam: 05/27/2023 Medical Rec #:  [id number]      Height:       65.0 in Accession #:    [id number]     Weight:       190.5 lb Date of Birth:  1951-02-22     BSA:          1.938 m Patient Age:    71 years       BP:           101/60 mmHg Patient Gender: F              HR:           92 bpm. Exam Location:  Inpatient Procedure: 2D Echo, Cardiac Doppler, Color Doppler and Intracardiac            Opacification Agent Indications:    Autre respiratory distress R06.03  History:        Patient has no prior history of Echocardiogram examinations.                 Risk Factors:Hypertension, Diabetes and Dyslipidemia.  Sonographer:    Lucendia Herrlich RCS Referring Phys: [phone] [facility]  Sonographer Comments: Image acquisition challenging due to uncooperative patient. IMPRESSIONS  1. Left ventricular ejection fraction, by estimation, is 60 to 65%. The left ventricle has normal function. The left ventricle has no regional wall motion abnormalities.  Left ventricular diastolic parameters are consistent with Grade I diastolic dysfunction (impaired relaxation).  2. Right ventricular systolic function is normal. The right ventricular size is normal. There is normal pulmonary artery systolic pressure. The estimated right ventricular systolic pressure is 20.3 mmHg.  3. The mitral valve is grossly normal. Trivial mitral valve regurgitation. No evidence of mitral stenosis.  4. The aortic valve is tricuspid. Aortic valve regurgitation is not visualized. No aortic stenosis is present.  5. The inferior vena cava is normal in size with greater than 50% respiratory variability, suggesting right atrial pressure of 3 mmHg. FINDINGS  Left Ventricle: Left ventricular ejection fraction, by estimation, is 60 to 65%. The left ventricle has normal function. The left ventricle has no regional wall motion abnormalities. Definity contrast agent was given IV to delineate the left ventricular  endocardial borders. The left ventricular internal cavity size was normal in size. There is no left ventricular hypertrophy. Left ventricular diastolic parameters are consistent with Grade I diastolic dysfunction (impaired relaxation). Right Ventricle: The right ventricular size is normal. No increase in right ventricular wall thickness. Right ventricular systolic function is normal. There is normal pulmonary artery systolic pressure. The tricuspid regurgitant velocity is 2.08 m/s, and  with an assumed right atrial pressure of 3 mmHg, the estimated right ventricular systolic pressure is 20.3 mmHg. Left Atrium: Left atrial size was normal in size. Right Atrium: Right atrial size was normal in size. Pericardium: There is no evidence of pericardial effusion. Presence of epicardial fat layer. Mitral Valve: The mitral valve is grossly normal. Trivial mitral valve regurgitation. No evidence of mitral valve stenosis. Tricuspid Valve: The tricuspid valve is grossly normal. Tricuspid valve regurgitation is  trivial. No evidence of tricuspid stenosis. Aortic Valve: The aortic valve is tricuspid. Aortic valve regurgitation is not visualized. No  NAME:  Madison Ho, MRN:  [id number], DOB:  02-05-1951, LOS: 19 ADMISSION DATE:  05/07/2023, CONSULTATION DATE:  05/26/23 REFERRING MD:  Dr Hughie Closs, CHIEF COMPLAINT:  Wosening resp failure and left lung white out on imaging    History of Present Illness: - hx from chart review and d/w Dr Pollie Meyer  72 year old obese female with standard past medical history of hypertension, hyperlipidemia, type 2 diabetes with neuropathy and previous heavy smoking quit 6/8 weeks prior to admission.  Admitted on 05/07/2023 with failure to thrive 50 pound unintentional weight loss, poor appetite and left shoulder pain all over 4-8 weeks associated with gait imbalance.  CT at admission consistent with stage IV non-small cell lung cancer new diagnosis (CT 05/08/23: Entire left upper lobe mass 17 cm x 13 times x 15 encasing the left pulmonary artery with mass effect and narrowing, abuts the distal trachea and encases the left mainstem bronchus and occluding the left upper lobe bronchus associated with necrotic changes and gas.  There is also extension to the left chest wall with extension of the mass into the intercostal musculature in the left axilla and subpectoral tissues.  There is also an anterior mediastinal 3 cm mass all associated with 1st-4th rib left-sided destruction. There is compression of the left supraclinoid vein by the chest wall mass. Also 3.2cm Rt Adrenal mass).  Other diagnosis included possible UTI, AKI uncontrolled pain and compression fractures of the T9  PAST    has a past medical history of Diabetes mellitus without complication (HCC), Encounter for hepatitis C screening test for low risk patient (08/30/2019), Encounter for imaging to assess osteoporosis (08/30/2019), Encounter for screening mammogram for malignant neoplasm of breast (08/30/2019), Hyperlipidemia, Hypertension, and Type 2 diabetes mellitus with neurological complications (HCC) (08/30/2019).   has a past surgical history that  includes Leg Surgery; Tubal ligation; and Eye surgery (N/A).   Significant Hospital Events:  05/07/2023 - admit  Course in the hospital - 05/08/2023 critical care medicine consult: Recommended biopsy and addressing goals of care -> needing oxygen therapy -05/09/2023 underwent CT-guided left upper lobe chest wall mass biopsy > biopsy which features of poorly differentiated squamous cell carcinoma  -Sepsis features of lactic acid and SIRS improved and also kidney injury -05/11/2023: Deemed moderate fall risk because of witnessed fall in the hospital  -Oncology consult and MRI of the brain recommended along with foundation 1 testing and goals of care  -Diagnosis stage IV [id number] squamous cell carcinoma  -PD-L1 sent -05/12/2023: MRI brain solitary 8 mm intracranial met without cerebral edema  -Steroids not started -05/13/2023: Requiring 2.5 L oxygen  -05/14/2023: Noted to have aspiration like symptoms and little bit somnolent oncology goals of care recommended DNR.  Family not ready for DNR.   -Respiratory status decline now requiring 3 L oxygen -Demonstrate well aspiration   - Lethargy and delirium  -05/15/2023: Radiation oncology consult  -Goals of her to go to SNF upon discharge  -Plan for radiation to the brain and to the lung  -Oncology consult signed off  -  -05/16/2023  -Modified barium swallow evaluation: Dysphagia 1 nectar thick liquids recommended with sitting upright after meals  -More alert.  Less delirium  -Continued goals of care discussion  -05/17/2023  -More alert but having significant left arm swelling.  Also deconditioned.  -Started radiation [goal 10 sessions] but did not complete due to left upper extremity pain  -Left upper extremity DVT diagnosed and therapeutic Lovenox started  -05/20/2023   -Has completed only  NAME:  Madison Ho, MRN:  [id number], DOB:  02-05-1951, LOS: 19 ADMISSION DATE:  05/07/2023, CONSULTATION DATE:  05/26/23 REFERRING MD:  Dr Hughie Closs, CHIEF COMPLAINT:  Wosening resp failure and left lung white out on imaging    History of Present Illness: - hx from chart review and d/w Dr Pollie Meyer  72 year old obese female with standard past medical history of hypertension, hyperlipidemia, type 2 diabetes with neuropathy and previous heavy smoking quit 6/8 weeks prior to admission.  Admitted on 05/07/2023 with failure to thrive 50 pound unintentional weight loss, poor appetite and left shoulder pain all over 4-8 weeks associated with gait imbalance.  CT at admission consistent with stage IV non-small cell lung cancer new diagnosis (CT 05/08/23: Entire left upper lobe mass 17 cm x 13 times x 15 encasing the left pulmonary artery with mass effect and narrowing, abuts the distal trachea and encases the left mainstem bronchus and occluding the left upper lobe bronchus associated with necrotic changes and gas.  There is also extension to the left chest wall with extension of the mass into the intercostal musculature in the left axilla and subpectoral tissues.  There is also an anterior mediastinal 3 cm mass all associated with 1st-4th rib left-sided destruction. There is compression of the left supraclinoid vein by the chest wall mass. Also 3.2cm Rt Adrenal mass).  Other diagnosis included possible UTI, AKI uncontrolled pain and compression fractures of the T9  PAST    has a past medical history of Diabetes mellitus without complication (HCC), Encounter for hepatitis C screening test for low risk patient (08/30/2019), Encounter for imaging to assess osteoporosis (08/30/2019), Encounter for screening mammogram for malignant neoplasm of breast (08/30/2019), Hyperlipidemia, Hypertension, and Type 2 diabetes mellitus with neurological complications (HCC) (08/30/2019).   has a past surgical history that  includes Leg Surgery; Tubal ligation; and Eye surgery (N/A).   Significant Hospital Events:  05/07/2023 - admit  Course in the hospital - 05/08/2023 critical care medicine consult: Recommended biopsy and addressing goals of care -> needing oxygen therapy -05/09/2023 underwent CT-guided left upper lobe chest wall mass biopsy > biopsy which features of poorly differentiated squamous cell carcinoma  -Sepsis features of lactic acid and SIRS improved and also kidney injury -05/11/2023: Deemed moderate fall risk because of witnessed fall in the hospital  -Oncology consult and MRI of the brain recommended along with foundation 1 testing and goals of care  -Diagnosis stage IV [id number] squamous cell carcinoma  -PD-L1 sent -05/12/2023: MRI brain solitary 8 mm intracranial met without cerebral edema  -Steroids not started -05/13/2023: Requiring 2.5 L oxygen  -05/14/2023: Noted to have aspiration like symptoms and little bit somnolent oncology goals of care recommended DNR.  Family not ready for DNR.   -Respiratory status decline now requiring 3 L oxygen -Demonstrate well aspiration   - Lethargy and delirium  -05/15/2023: Radiation oncology consult  -Goals of her to go to SNF upon discharge  -Plan for radiation to the brain and to the lung  -Oncology consult signed off  -  -05/16/2023  -Modified barium swallow evaluation: Dysphagia 1 nectar thick liquids recommended with sitting upright after meals  -More alert.  Less delirium  -Continued goals of care discussion  -05/17/2023  -More alert but having significant left arm swelling.  Also deconditioned.  -Started radiation [goal 10 sessions] but did not complete due to left upper extremity pain  -Left upper extremity DVT diagnosed and therapeutic Lovenox started  -05/20/2023   -Has completed only  05/25/23 0727  GLUCAP 96 121* 86         IMAGING x48h  - image(s) personally visualized  -   highlighted in bold ECHOCARDIOGRAM COMPLETE  Result Date: 05/27/2023    ECHOCARDIOGRAM REPORT   Patient Name:   Madison Ho Date of Exam: 05/27/2023 Medical Rec #:  [id number]      Height:       65.0 in Accession #:    [id number]     Weight:       190.5 lb Date of Birth:  1951-02-22     BSA:          1.938 m Patient Age:    71 years       BP:           101/60 mmHg Patient Gender: F              HR:           92 bpm. Exam Location:  Inpatient Procedure: 2D Echo, Cardiac Doppler, Color Doppler and Intracardiac            Opacification Agent Indications:    Autre respiratory distress R06.03  History:        Patient has no prior history of Echocardiogram examinations.                 Risk Factors:Hypertension, Diabetes and Dyslipidemia.  Sonographer:    Lucendia Herrlich RCS Referring Phys: [phone] [facility]  Sonographer Comments: Image acquisition challenging due to uncooperative patient. IMPRESSIONS  1. Left ventricular ejection fraction, by estimation, is 60 to 65%. The left ventricle has normal function. The left ventricle has no regional wall motion abnormalities.  Left ventricular diastolic parameters are consistent with Grade I diastolic dysfunction (impaired relaxation).  2. Right ventricular systolic function is normal. The right ventricular size is normal. There is normal pulmonary artery systolic pressure. The estimated right ventricular systolic pressure is 20.3 mmHg.  3. The mitral valve is grossly normal. Trivial mitral valve regurgitation. No evidence of mitral stenosis.  4. The aortic valve is tricuspid. Aortic valve regurgitation is not visualized. No aortic stenosis is present.  5. The inferior vena cava is normal in size with greater than 50% respiratory variability, suggesting right atrial pressure of 3 mmHg. FINDINGS  Left Ventricle: Left ventricular ejection fraction, by estimation, is 60 to 65%. The left ventricle has normal function. The left ventricle has no regional wall motion abnormalities. Definity contrast agent was given IV to delineate the left ventricular  endocardial borders. The left ventricular internal cavity size was normal in size. There is no left ventricular hypertrophy. Left ventricular diastolic parameters are consistent with Grade I diastolic dysfunction (impaired relaxation). Right Ventricle: The right ventricular size is normal. No increase in right ventricular wall thickness. Right ventricular systolic function is normal. There is normal pulmonary artery systolic pressure. The tricuspid regurgitant velocity is 2.08 m/s, and  with an assumed right atrial pressure of 3 mmHg, the estimated right ventricular systolic pressure is 20.3 mmHg. Left Atrium: Left atrial size was normal in size. Right Atrium: Right atrial size was normal in size. Pericardium: There is no evidence of pericardial effusion. Presence of epicardial fat layer. Mitral Valve: The mitral valve is grossly normal. Trivial mitral valve regurgitation. No evidence of mitral valve stenosis. Tricuspid Valve: The tricuspid valve is grossly normal. Tricuspid valve regurgitation is  trivial. No evidence of tricuspid stenosis. Aortic Valve: The aortic valve is tricuspid. Aortic valve regurgitation is not visualized. No

## 2023-05-27 NOTE — Progress Notes (Signed)
Daily Progress Note   Patient Name: Madison Ho       Date: 05/27/2023 DOB: 04/06/1951  Age: 72 y.o. MRN#: [id number] Attending Physician: Hughie Closs, MD Primary Care Physician: Ignatius Specking, MD Admit Date: 05/07/2023 Length of Stay: 19 days  Reason for Consultation/Follow-up: Establishing goals of care  Subjective:   CC: Patient notes pain and shortness of breath. Following up regarding complex medical decision making.   Subjective:  Reviewed EMR prior to presenting to bedside. Hospitalist able to follow up with patient's son at bedside yesterday evening to review CT. CT obtained showing enlarging mass with associated malignant effusion.  Presented to bedside to meet with patient.  Patient's daughter present at bedside as well.  Introduced myself as a member of the palliative medicine team.  With the patient and daughter have heard that patient's lung cancer has continued to progress and medically no further interventions will reverse this course.  Patient and daughter noted patient wants to get home to spend time with her family  and cat.  Will to discuss generalities of supporting patient going home with hospice to focus on quality time with family at the end of life.  Discussed could also adjust medications to allow for symptom management such as opioids for dyspnea.  Discussed with patient and daughter and will start low-dose morphine solution to assist with pain and dyspnea as patient could take this at home with hospice.  Also discussed starting steroid to determine if this will help with inflammatory aspects causing shortness of breath and pain.  Noted would reach out to [facility] to provide assistance with coordination of getting patient home with hospice.  Daughter noted they would need durable medical equipment at home; daughter planning to go home today to clear out space for this.  Noted would hopefully be able to coordinate hospice today and then potentially go home with support  tomorrow.  Patient agreeing with this plan.  All questions answered at that time.  Noted palliative medicine team continue to follow along with patient's medical journey.  Objective:   Vital Signs:  BP 133/60   Pulse (!) 104   Temp 98 F (36.7 C) (Oral)   Resp 18   Ht 5\' 5"  (1.651 m)   Wt 86.4 kg   SpO2 91%   BMI 31.70 kg/m   Physical Exam: General: Ill-appearing, debilitated, laying in bed HENT: Dry mucous membranes Cardiovascular: RRR Respiratory: increased work of breathing noted, on high flow nasal cannula 15 L/min Neuro: Alert, interactive, appropriately answering questions, following commands easily Psych: appropriately answers all questions  Imaging: I personally reviewed recent imaging.   Assessment & Plan:   Assessment: Patient is a 72 y.o. female with past medical history of hypertension, hyperlipidemia, diabetes, neuropathy, obesity admitted on 05/07/2023 with significant weight loss, weakness, left shoulder pain and left chest swelling. Diagnosed with potential UTI and aspiration pneumonia but also with metastatic lung care to right adrenal, brain. Tentative plans for SRS radiation to brain but with ongoing decline will reassess goals of care before proceeding with treatment.   Recommendations/Plan: # Complex medical decision making/goals of care:  Discussed care with patient and daughter who was at bedside.  Patient and family now electing to support patient going home with hospice to allow her to have quality time visiting with family and her cat.  Have consulted TOC to assist with coordination of getting patient home with hospice support.  -  Code Status: Limited: Do not attempt resuscitation (DNR) -DNR-LIMITED -Do Not  Intubate/DNI   # Symptom management:  -Pain/dyspnea, in setting of static lung cancer   -Start oral morphine solution 2.5-5mg  q2hrs prn    -Continue IV morphine to 1-2mg  q2 hrs prn breakthrough pain/dyspnea -Discontinue Ultram -Tylenol 1000 mg  p.o. around-the-clock every 8 hours - Gabapentin 100 mg twice daily - Lidocaine 5% patch transdermal over 12 hours every 24 hours  # Psychosocial Support:  -son, daughter  # Discharge Planning: Home with Hospice  Discussed with: patient, hospitalist, PCCM, RN, patient's daughter  Thank you for allowing the palliative care team to participate in the care [employer].  Alvester Morin, DO Palliative Care Provider PMT # [phone]  If patient remains symptomatic despite maximum doses, please call PMT at [phone] between 0700 and 1900. Outside of these hours, please call attending, as PMT does not have night coverage.  *Please note that this is a verbal dictation therefore any spelling or grammatical errors are due to the "[employer]" system interpretation.

## 2023-05-27 NOTE — TOC Progression Note (Signed)
Transition of Care [facility]) - Progression Note    Patient Details  Name: Madison Ho MRN: [id number] Date of Birth: February 25, 1951  Transition of Care [facility]) CM/SW Contact  Adrian Prows, RN Phone Number: 05/27/2023, 4:45 PM  Clinical Narrative:    Sherron Monday w/ pt's dtr Alfredo Bach [phone]) regarding home hospice; she says [facility] was selected; [facility] Liaison has made contact w/ family; plan to d/c home tomorrow w/ hospice dependent upon delivery of DME.   Expected Discharge Plan: Skilled Nursing Facility Barriers to Discharge: Continued Medical Work up  Expected Discharge Plan and Services In-house Referral: Clinical Social Work Discharge Planning Services: CM Consult Post Acute Care Choice: Home Health Living arrangements for the past 2 months: Single Family Home                 DME Arranged: N/A         HH Arranged: PT HH Agency: [facility] Date HH Agency Contacted: 05/11/23 Time HH Agency Contacted: 1145 Representative spoke with at [facility] Agency: Tresa Endo   Social Determinants of Health (SDOH) Interventions SDOH Screenings   Food Insecurity: No Food Insecurity (05/08/2023)  Housing: Low Risk  (05/08/2023)  Transportation Needs: No Transportation Needs (05/08/2023)  Utilities: Not At Risk (05/08/2023)  Alcohol Screen: Low Risk  (11/10/2020)  Depression (PHQ2-9): Low Risk  (11/10/2020)  Financial Resource Strain: Low Risk  (11/10/2020)  Physical Activity: Inactive (11/10/2020)  Social Connections: Moderately Isolated (11/10/2020)  Stress: No Stress Concern Present (11/10/2020)  Tobacco Use: High Risk (05/07/2023)    Readmission Risk Interventions     No data to display

## 2023-05-27 NOTE — Progress Notes (Signed)
Echocardiogram 2D Echocardiogram has been performed.  Madison Ho 05/27/2023, 9:36 AM

## 2023-05-28 ENCOUNTER — Ambulatory Visit: Payer: Medicare PPO

## 2023-05-28 ENCOUNTER — Other Ambulatory Visit (HOSPITAL_COMMUNITY): Payer: Self-pay

## 2023-05-28 ENCOUNTER — Encounter: Payer: Self-pay | Admitting: Radiation Oncology

## 2023-05-28 DIAGNOSIS — C3492 Malignant neoplasm of unspecified part of left bronchus or lung: Secondary | ICD-10-CM | POA: Diagnosis not present

## 2023-05-28 DIAGNOSIS — G928 Other toxic encephalopathy: Secondary | ICD-10-CM | POA: Diagnosis not present

## 2023-05-28 DIAGNOSIS — I82A12 Acute embolism and thrombosis of left axillary vein: Secondary | ICD-10-CM | POA: Diagnosis not present

## 2023-05-28 DIAGNOSIS — J9601 Acute respiratory failure with hypoxia: Secondary | ICD-10-CM | POA: Diagnosis not present

## 2023-05-28 DIAGNOSIS — M25512 Pain in left shoulder: Secondary | ICD-10-CM | POA: Diagnosis not present

## 2023-05-28 DIAGNOSIS — Z7189 Other specified counseling: Secondary | ICD-10-CM | POA: Diagnosis not present

## 2023-05-28 DIAGNOSIS — C7931 Secondary malignant neoplasm of brain: Secondary | ICD-10-CM | POA: Diagnosis not present

## 2023-05-28 DIAGNOSIS — Z515 Encounter for palliative care: Secondary | ICD-10-CM | POA: Diagnosis not present

## 2023-05-28 MED ORDER — APIXABAN 5 MG PO TABS
5.0000 mg | ORAL_TABLET | Freq: Two times a day (BID) | ORAL | 0 refills | Status: AC
  Filled 2023-05-28: qty 60, 30d supply, fill #0

## 2023-05-28 MED ORDER — MORPHINE SULFATE 10 MG/5ML PO SOLN
2.5000 mg | ORAL | 0 refills | Status: DC | PRN
  Filled 2023-05-28 (×2): qty 50, 4d supply, fill #0

## 2023-05-28 NOTE — Plan of Care (Signed)
  Problem: Education: Goal: Ability to describe self-care measures that may prevent or decrease complications (Diabetes Survival Skills Education) will improve Outcome: Adequate for Discharge Goal: Individualized Educational Video(s) Outcome: Adequate for Discharge   Problem: Coping: Goal: Ability to adjust to condition or change in health will improve Outcome: Adequate for Discharge   Problem: Fluid Volume: Goal: Ability to maintain a balanced intake and output will improve Outcome: Adequate for Discharge   Problem: Health Behavior/Discharge Planning: Goal: Ability to identify and utilize available resources and services will improve Outcome: Adequate for Discharge Goal: Ability to manage health-related needs will improve Outcome: Adequate for Discharge   Problem: Metabolic: Goal: Ability to maintain appropriate glucose levels will improve Outcome: Adequate for Discharge   Problem: Nutritional: Goal: Maintenance of adequate nutrition will improve Outcome: Adequate for Discharge Goal: Progress toward achieving an optimal weight will improve Outcome: Adequate for Discharge   Problem: Skin Integrity: Goal: Risk for impaired skin integrity will decrease Outcome: Adequate for Discharge   Problem: Tissue Perfusion: Goal: Adequacy of tissue perfusion will improve Outcome: Adequate for Discharge   Problem: Education: Goal: Knowledge of General Education information will improve Description: Including pain rating scale, medication(s)/side effects and non-pharmacologic comfort measures Outcome: Adequate for Discharge   Problem: Health Behavior/Discharge Planning: Goal: Ability to manage health-related needs will improve Outcome: Adequate for Discharge   Problem: Clinical Measurements: Goal: Ability to maintain clinical measurements within normal limits will improve Outcome: Adequate for Discharge Goal: Will remain free from infection Outcome: Adequate for Discharge Goal:  Diagnostic test results will improve Outcome: Adequate for Discharge Goal: Respiratory complications will improve Outcome: Adequate for Discharge Goal: Cardiovascular complication will be avoided Outcome: Adequate for Discharge   Problem: Activity: Goal: Risk for activity intolerance will decrease Outcome: Adequate for Discharge   Problem: Nutrition: Goal: Adequate nutrition will be maintained Outcome: Adequate for Discharge   Problem: Coping: Goal: Level of anxiety will decrease Outcome: Adequate for Discharge   Problem: Elimination: Goal: Will not experience complications related to bowel motility Outcome: Adequate for Discharge Goal: Will not experience complications related to urinary retention Outcome: Adequate for Discharge   Problem: Pain Managment: Goal: General experience of comfort will improve Outcome: Adequate for Discharge   Problem: Safety: Goal: Ability to remain free from injury will improve Outcome: Adequate for Discharge   Problem: Skin Integrity: Goal: Risk for impaired skin integrity will decrease Outcome: Adequate for Discharge   Problem: Education: Goal: Knowledge of the prescribed therapy will improve Outcome: Adequate for Discharge   Problem: Bowel/Gastric: Goal: Occurrences of nausea, vomiting, and/or diarrhea will decrease Outcome: Adequate for Discharge   Problem: Coping: Goal: Level of anxiety will decrease Outcome: Adequate for Discharge Goal: Ability to identify and develop effective coping behavior will improve Outcome: Adequate for Discharge   Problem: Nutritional: Goal: Will achieve and/or maintain adequate nutritional intake Outcome: Adequate for Discharge   Problem: Skin Integrity: Goal: Risk for impaired skin integrity will decrease Outcome: Adequate for Discharge   Problem: Urinary Elimination: Goal: Complications of radiation-induced cystitis will be minimized/avoided Outcome: Adequate for Discharge   Problem:  Fatigue: Goal: Expression of feelings of increased energy will improve Outcome: Adequate for Discharge

## 2023-05-28 NOTE — Discharge Summary (Signed)
Physician Discharge Summary  Madison Ho DGU:[id number] DOB: 01-21-1951 DOA: 05/07/2023  PCP: Ignatius Specking, MD  Admit date: 05/07/2023 Discharge date: 05/28/2023 30 Day Unplanned Readmission Risk Score    Flowsheet Row ED to Hosp-Admission (Current) from 05/07/2023 in [facility]-ICU/STEPDOWN  30 Day Unplanned Readmission Risk Score (%) 15.06 Filed at 05/28/2023 0801       This score is the patient's risk of an unplanned readmission within 30 days of being discharged (0 -100%). The score is based on dignosis, age, lab data, medications, orders, and past utilization.   Low:  0-14.9   Medium: 15-21.9   High: 22-29.9   Extreme: 30 and above          Admitted From: Home Disposition: Home with hospice  Recommendations for Outpatient Follow-up:  Follow up with PCP in 1-2 weeks Please obtain BMP/CBC in one week Please follow up with your PCP on the following pending results: Unresulted Labs (From admission, onward)    None         Home Health: Yes/Home with hospice Equipment/Devices: Home oxygen  Discharge Condition: Guarded CODE STATUS: DNR Diet recommendation: Cardiac  Subjective: Seen and examined.  Despite receiving high amount of oxygen, she has no shortness of breath.  Daughter at the bedside.  Plan confirmed that she still wants to go home and spend the rest of the days of her life at home with her cat and family.  Brief/Interim Summary: 72 year old female with past medical history of hypertension, hyperlipidemia, diabetes mellitus type 2, COPD and neuropathy who presented to [facility] emergency department with left arm and shoulder pain 40 pound weight loss and failure to thrive.   Patient was admitted to hospital service. Workup included CT imaging which revealed a large left lung mass concerning for primary lung malignancy.  Biopsy of left upper lobe mass on 10/9 was consistent with squamous cell lung cancer showing local invasion,  right adrenal metastases and a solitary 8 mm intracranial metastasis on MRI.  Dr. Cherly Hensen with Medical Oncology was consulted as well as radiation oncology.  Patient was transferred to [facility] for initiation of palliative XRT, 10 fractions of palliative chest radiation.  Palliative care is also been consulted for their assistance.     Hospital course has had multiple complications including an episode of observed aspiration on 10/14.  Patient was briefly made n.p.o. and speech therapy evaluation was carried out followed by modified barium swallow on 10/16.  An appropriate dysphagia 2 diet was then ordered.  Additionally patient developed a left upper extremity DVT identified on ultrasound on 10/17 for which Lovenox was initiated.  Details below.   Acute hypoxic respiratory failure secondary to worsening squamous cell lung cancer, left (HCC) Identification of large left lung mass upon this admission determined to be squamous cell lung cancer. Mass is substantial in size measuring 13 x 15 cm x 17 cm with encasement of the left main pulmonary artery with mass effect as well as abutting the distal trachea, encasing the left mainstem bronchus, occluding the left upper lobe bronchus and abutting the thoracic aorta. Started radiation therapy.  She received couple of sessions, plan was to do 10 sessions but patient got sicker with acute hypoxic respiratory failure and left pleural effusion on 05/25/2023, transferred to stepdown, chest x-ray showed complete opacification of the left lung, PCCM consulted, CT chest shows enlarging mass.  Long discussion with son and daughter and patient by me, PCCM and palliative care, patient decided against  through the chest wall up to 6 cm. Mass appears to have enlarged compared to CT 05/08/2023. 3. Masslike extension to the LEFT supraclavicular region and LEFT axilla. 4. AP window extension of the mass. 5. Anterior LEFT rib involvement and chronic compression fracture of midthoracic spine unchanged Electronically Signed   By: Genevive Bi M.D.   On: 05/26/2023 16:33   DG Chest Port 1 View  Result Date: 05/25/2023 CLINICAL DATA:  Shortness of breath, hypoxia EXAM: PORTABLE CHEST 1 VIEW COMPARISON:  05/13/2023 FINDINGS: Complete opacification of the left hemithorax. Right lung clear. No acute bony abnormality. IMPRESSION: Complete opacification of the left hemithorax. Electronically Signed   By: Charlett Nose M.D.   On: 05/25/2023 23:43   VAS [place] UPPER EXTREMITY VENOUS DUPLEX  Result Date: 05/17/2023 UPPER VENOUS STUDY  Patient Name:  Madison Ho  Date of Exam:   05/17/2023 Medical Rec #: [id number]       Accession #:    [id number] Date of Birth: [birth year]      Patient Gender: F Patient Age:   73 years Exam Location:  [facility] Procedure:      VAS [place] UPPER EXTREMITY VENOUS DUPLEX Referring Phys: Hazeline Junker  --------------------------------------------------------------------------------  Indications: Pain, and Swelling Risk Factors: Recent diagnosis of metastatic lung cancer. Limitations: Poor ultrasound/tissue interface and mass of upper lobe of left lung, patient unable to position properly due to pain. Comparison Study: No previous exams Performing Technologist: Jody Hill RVT, RDMS  Examination Guidelines: A complete evaluation includes B-mode imaging, spectral Doppler, color Doppler, and power Doppler as needed of all accessible portions of each vessel. Bilateral testing is considered an integral part of a complete examination. Limited examinations for reoccurring indications may be performed as noted.  Right Findings: +----------+------------+---------+-----------+----------+--------------------+ RIGHT     CompressiblePhasicitySpontaneousProperties      Summary        +----------+------------+---------+-----------+----------+--------------------+ Subclavian               Yes       Yes                   patent by                                                              color/doppler     +----------+------------+---------+-----------+----------+--------------------+  Left Findings: +----------+------------+---------+-----------+----------+-----------------+ LEFT      CompressiblePhasicitySpontaneousProperties     Summary      +----------+------------+---------+-----------+----------+-----------------+ IJV           Full       Yes       Yes                                +----------+------------+---------+-----------+----------+-----------------+ Subclavian                                           Not visualized   +----------+------------+---------+-----------+----------+-----------------+ Axillary      None       No        No  through the chest wall up to 6 cm. Mass appears to have enlarged compared to CT 05/08/2023. 3. Masslike extension to the LEFT supraclavicular region and LEFT axilla. 4. AP window extension of the mass. 5. Anterior LEFT rib involvement and chronic compression fracture of midthoracic spine unchanged Electronically Signed   By: Genevive Bi M.D.   On: 05/26/2023 16:33   DG Chest Port 1 View  Result Date: 05/25/2023 CLINICAL DATA:  Shortness of breath, hypoxia EXAM: PORTABLE CHEST 1 VIEW COMPARISON:  05/13/2023 FINDINGS: Complete opacification of the left hemithorax. Right lung clear. No acute bony abnormality. IMPRESSION: Complete opacification of the left hemithorax. Electronically Signed   By: Charlett Nose M.D.   On: 05/25/2023 23:43   VAS [place] UPPER EXTREMITY VENOUS DUPLEX  Result Date: 05/17/2023 UPPER VENOUS STUDY  Patient Name:  Madison Ho  Date of Exam:   05/17/2023 Medical Rec #: [id number]       Accession #:    [id number] Date of Birth: [birth year]      Patient Gender: F Patient Age:   73 years Exam Location:  [facility] Procedure:      VAS [place] UPPER EXTREMITY VENOUS DUPLEX Referring Phys: Hazeline Junker  --------------------------------------------------------------------------------  Indications: Pain, and Swelling Risk Factors: Recent diagnosis of metastatic lung cancer. Limitations: Poor ultrasound/tissue interface and mass of upper lobe of left lung, patient unable to position properly due to pain. Comparison Study: No previous exams Performing Technologist: Jody Hill RVT, RDMS  Examination Guidelines: A complete evaluation includes B-mode imaging, spectral Doppler, color Doppler, and power Doppler as needed of all accessible portions of each vessel. Bilateral testing is considered an integral part of a complete examination. Limited examinations for reoccurring indications may be performed as noted.  Right Findings: +----------+------------+---------+-----------+----------+--------------------+ RIGHT     CompressiblePhasicitySpontaneousProperties      Summary        +----------+------------+---------+-----------+----------+--------------------+ Subclavian               Yes       Yes                   patent by                                                              color/doppler     +----------+------------+---------+-----------+----------+--------------------+  Left Findings: +----------+------------+---------+-----------+----------+-----------------+ LEFT      CompressiblePhasicitySpontaneousProperties     Summary      +----------+------------+---------+-----------+----------+-----------------+ IJV           Full       Yes       Yes                                +----------+------------+---------+-----------+----------+-----------------+ Subclavian                                           Not visualized   +----------+------------+---------+-----------+----------+-----------------+ Axillary      None       No        No  Physician Discharge Summary  Madison Ho DGU:[id number] DOB: 01-21-1951 DOA: 05/07/2023  PCP: Ignatius Specking, MD  Admit date: 05/07/2023 Discharge date: 05/28/2023 30 Day Unplanned Readmission Risk Score    Flowsheet Row ED to Hosp-Admission (Current) from 05/07/2023 in [facility]-ICU/STEPDOWN  30 Day Unplanned Readmission Risk Score (%) 15.06 Filed at 05/28/2023 0801       This score is the patient's risk of an unplanned readmission within 30 days of being discharged (0 -100%). The score is based on dignosis, age, lab data, medications, orders, and past utilization.   Low:  0-14.9   Medium: 15-21.9   High: 22-29.9   Extreme: 30 and above          Admitted From: Home Disposition: Home with hospice  Recommendations for Outpatient Follow-up:  Follow up with PCP in 1-2 weeks Please obtain BMP/CBC in one week Please follow up with your PCP on the following pending results: Unresulted Labs (From admission, onward)    None         Home Health: Yes/Home with hospice Equipment/Devices: Home oxygen  Discharge Condition: Guarded CODE STATUS: DNR Diet recommendation: Cardiac  Subjective: Seen and examined.  Despite receiving high amount of oxygen, she has no shortness of breath.  Daughter at the bedside.  Plan confirmed that she still wants to go home and spend the rest of the days of her life at home with her cat and family.  Brief/Interim Summary: 72 year old female with past medical history of hypertension, hyperlipidemia, diabetes mellitus type 2, COPD and neuropathy who presented to [facility] emergency department with left arm and shoulder pain 40 pound weight loss and failure to thrive.   Patient was admitted to hospital service. Workup included CT imaging which revealed a large left lung mass concerning for primary lung malignancy.  Biopsy of left upper lobe mass on 10/9 was consistent with squamous cell lung cancer showing local invasion,  right adrenal metastases and a solitary 8 mm intracranial metastasis on MRI.  Dr. Cherly Hensen with Medical Oncology was consulted as well as radiation oncology.  Patient was transferred to [facility] for initiation of palliative XRT, 10 fractions of palliative chest radiation.  Palliative care is also been consulted for their assistance.     Hospital course has had multiple complications including an episode of observed aspiration on 10/14.  Patient was briefly made n.p.o. and speech therapy evaluation was carried out followed by modified barium swallow on 10/16.  An appropriate dysphagia 2 diet was then ordered.  Additionally patient developed a left upper extremity DVT identified on ultrasound on 10/17 for which Lovenox was initiated.  Details below.   Acute hypoxic respiratory failure secondary to worsening squamous cell lung cancer, left (HCC) Identification of large left lung mass upon this admission determined to be squamous cell lung cancer. Mass is substantial in size measuring 13 x 15 cm x 17 cm with encasement of the left main pulmonary artery with mass effect as well as abutting the distal trachea, encasing the left mainstem bronchus, occluding the left upper lobe bronchus and abutting the thoracic aorta. Started radiation therapy.  She received couple of sessions, plan was to do 10 sessions but patient got sicker with acute hypoxic respiratory failure and left pleural effusion on 05/25/2023, transferred to stepdown, chest x-ray showed complete opacification of the left lung, PCCM consulted, CT chest shows enlarging mass.  Long discussion with son and daughter and patient by me, PCCM and palliative care, patient decided against  through the chest wall up to 6 cm. Mass appears to have enlarged compared to CT 05/08/2023. 3. Masslike extension to the LEFT supraclavicular region and LEFT axilla. 4. AP window extension of the mass. 5. Anterior LEFT rib involvement and chronic compression fracture of midthoracic spine unchanged Electronically Signed   By: Genevive Bi M.D.   On: 05/26/2023 16:33   DG Chest Port 1 View  Result Date: 05/25/2023 CLINICAL DATA:  Shortness of breath, hypoxia EXAM: PORTABLE CHEST 1 VIEW COMPARISON:  05/13/2023 FINDINGS: Complete opacification of the left hemithorax. Right lung clear. No acute bony abnormality. IMPRESSION: Complete opacification of the left hemithorax. Electronically Signed   By: Charlett Nose M.D.   On: 05/25/2023 23:43   VAS [place] UPPER EXTREMITY VENOUS DUPLEX  Result Date: 05/17/2023 UPPER VENOUS STUDY  Patient Name:  Madison Ho  Date of Exam:   05/17/2023 Medical Rec #: [id number]       Accession #:    [id number] Date of Birth: [birth year]      Patient Gender: F Patient Age:   73 years Exam Location:  [facility] Procedure:      VAS [place] UPPER EXTREMITY VENOUS DUPLEX Referring Phys: Hazeline Junker  --------------------------------------------------------------------------------  Indications: Pain, and Swelling Risk Factors: Recent diagnosis of metastatic lung cancer. Limitations: Poor ultrasound/tissue interface and mass of upper lobe of left lung, patient unable to position properly due to pain. Comparison Study: No previous exams Performing Technologist: Jody Hill RVT, RDMS  Examination Guidelines: A complete evaluation includes B-mode imaging, spectral Doppler, color Doppler, and power Doppler as needed of all accessible portions of each vessel. Bilateral testing is considered an integral part of a complete examination. Limited examinations for reoccurring indications may be performed as noted.  Right Findings: +----------+------------+---------+-----------+----------+--------------------+ RIGHT     CompressiblePhasicitySpontaneousProperties      Summary        +----------+------------+---------+-----------+----------+--------------------+ Subclavian               Yes       Yes                   patent by                                                              color/doppler     +----------+------------+---------+-----------+----------+--------------------+  Left Findings: +----------+------------+---------+-----------+----------+-----------------+ LEFT      CompressiblePhasicitySpontaneousProperties     Summary      +----------+------------+---------+-----------+----------+-----------------+ IJV           Full       Yes       Yes                                +----------+------------+---------+-----------+----------+-----------------+ Subclavian                                           Not visualized   +----------+------------+---------+-----------+----------+-----------------+ Axillary      None       No        No  Physician Discharge Summary  Madison Ho DGU:[id number] DOB: 01-21-1951 DOA: 05/07/2023  PCP: Ignatius Specking, MD  Admit date: 05/07/2023 Discharge date: 05/28/2023 30 Day Unplanned Readmission Risk Score    Flowsheet Row ED to Hosp-Admission (Current) from 05/07/2023 in [facility]-ICU/STEPDOWN  30 Day Unplanned Readmission Risk Score (%) 15.06 Filed at 05/28/2023 0801       This score is the patient's risk of an unplanned readmission within 30 days of being discharged (0 -100%). The score is based on dignosis, age, lab data, medications, orders, and past utilization.   Low:  0-14.9   Medium: 15-21.9   High: 22-29.9   Extreme: 30 and above          Admitted From: Home Disposition: Home with hospice  Recommendations for Outpatient Follow-up:  Follow up with PCP in 1-2 weeks Please obtain BMP/CBC in one week Please follow up with your PCP on the following pending results: Unresulted Labs (From admission, onward)    None         Home Health: Yes/Home with hospice Equipment/Devices: Home oxygen  Discharge Condition: Guarded CODE STATUS: DNR Diet recommendation: Cardiac  Subjective: Seen and examined.  Despite receiving high amount of oxygen, she has no shortness of breath.  Daughter at the bedside.  Plan confirmed that she still wants to go home and spend the rest of the days of her life at home with her cat and family.  Brief/Interim Summary: 72 year old female with past medical history of hypertension, hyperlipidemia, diabetes mellitus type 2, COPD and neuropathy who presented to [facility] emergency department with left arm and shoulder pain 40 pound weight loss and failure to thrive.   Patient was admitted to hospital service. Workup included CT imaging which revealed a large left lung mass concerning for primary lung malignancy.  Biopsy of left upper lobe mass on 10/9 was consistent with squamous cell lung cancer showing local invasion,  right adrenal metastases and a solitary 8 mm intracranial metastasis on MRI.  Dr. Cherly Hensen with Medical Oncology was consulted as well as radiation oncology.  Patient was transferred to [facility] for initiation of palliative XRT, 10 fractions of palliative chest radiation.  Palliative care is also been consulted for their assistance.     Hospital course has had multiple complications including an episode of observed aspiration on 10/14.  Patient was briefly made n.p.o. and speech therapy evaluation was carried out followed by modified barium swallow on 10/16.  An appropriate dysphagia 2 diet was then ordered.  Additionally patient developed a left upper extremity DVT identified on ultrasound on 10/17 for which Lovenox was initiated.  Details below.   Acute hypoxic respiratory failure secondary to worsening squamous cell lung cancer, left (HCC) Identification of large left lung mass upon this admission determined to be squamous cell lung cancer. Mass is substantial in size measuring 13 x 15 cm x 17 cm with encasement of the left main pulmonary artery with mass effect as well as abutting the distal trachea, encasing the left mainstem bronchus, occluding the left upper lobe bronchus and abutting the thoracic aorta. Started radiation therapy.  She received couple of sessions, plan was to do 10 sessions but patient got sicker with acute hypoxic respiratory failure and left pleural effusion on 05/25/2023, transferred to stepdown, chest x-ray showed complete opacification of the left lung, PCCM consulted, CT chest shows enlarging mass.  Long discussion with son and daughter and patient by me, PCCM and palliative care, patient decided against  Acute       +----------+------------+---------+-----------+----------+-----------------+ Brachial      Full                                                     +----------+------------+---------+-----------+----------+-----------------+ Radial        Full                                                    +----------+------------+---------+-----------+----------+-----------------+ Ulnar                                                Not visualized   +----------+------------+---------+-----------+----------+-----------------+ Cephalic      None       No        No               Age Indeterminate +----------+------------+---------+-----------+----------+-----------------+ Basilic       Full       Yes       Yes                                +----------+------------+---------+-----------+----------+-----------------+ Sluggish flow seen in IJV and waveform phasic but diminished. Unable to visualize subclavian vein due to overlying mass. Ulner veins not visualized due to overlying edema and patient inability to move arm.  Summary:  Right: No evidence of thrombosis in the subclavian.  Left: Findings consistent with acute deep vein thrombosis involving the left axillary vein. Findings consistent with age indeterminate superficial vein thrombosis involving the forearm portion of left cephalic vein. However, unable to visualize the subclavian and ulnar veins. Subcutaneous edema extending from mid upper arm to wrist,  *See table(s) above for measurements and observations.  Diagnosing physician: Heath Lark Electronically signed by Heath Lark on 05/17/2023 at 4:20:10 PM.    Final    DG Swallowing Func-Speech Pathology  Result Date: 05/16/2023 Table formatting from the original result was not included. Modified Barium Swallow Study Patient Details Name: Madison Ho MRN: [id number] Date of Birth: [birth year] Today's Date: 05/16/2023 HPI/PMH: HPI: 72 year old home dwelling female admitted and found to have lung mass and now brain mets. Pt observed to cough with meals. PMH: HTN HLD DM TY 2 BMI 30  Prior  smoker until about 2 months ago  Came to emergency room 10/8 with left shoulder pain 40 pound weight loss failure to thrive low-grade fever temp tachycardia and hypotension responsive to IV fluid . CT scan = large left lung mass concerning for primary lung malignancy-  10/9 biopsy left upper lobe showing metastatic squamous cell CA   10/11 oncology consult 10-CT 4N2M1 SCC lung. MRI brain shows Solitary 8 mm intracranial metastasis at the anterior/inferior  aspect of the left lentiform nucleus. No significant mass effect.  CXR showed extensive left upper lobe  lung mass with veil like opacification over the left lower lobe.  2. Signs of venous congestion within the right lung. Clinical Impression: Clinical Impression: Patient presents with mild oropharyngeal dysphagia mostly characterized by impaired lingual control resulting  through the chest wall up to 6 cm. Mass appears to have enlarged compared to CT 05/08/2023. 3. Masslike extension to the LEFT supraclavicular region and LEFT axilla. 4. AP window extension of the mass. 5. Anterior LEFT rib involvement and chronic compression fracture of midthoracic spine unchanged Electronically Signed   By: Genevive Bi M.D.   On: 05/26/2023 16:33   DG Chest Port 1 View  Result Date: 05/25/2023 CLINICAL DATA:  Shortness of breath, hypoxia EXAM: PORTABLE CHEST 1 VIEW COMPARISON:  05/13/2023 FINDINGS: Complete opacification of the left hemithorax. Right lung clear. No acute bony abnormality. IMPRESSION: Complete opacification of the left hemithorax. Electronically Signed   By: Charlett Nose M.D.   On: 05/25/2023 23:43   VAS [place] UPPER EXTREMITY VENOUS DUPLEX  Result Date: 05/17/2023 UPPER VENOUS STUDY  Patient Name:  Madison Ho  Date of Exam:   05/17/2023 Medical Rec #: [id number]       Accession #:    [id number] Date of Birth: [birth year]      Patient Gender: F Patient Age:   73 years Exam Location:  [facility] Procedure:      VAS [place] UPPER EXTREMITY VENOUS DUPLEX Referring Phys: Hazeline Junker  --------------------------------------------------------------------------------  Indications: Pain, and Swelling Risk Factors: Recent diagnosis of metastatic lung cancer. Limitations: Poor ultrasound/tissue interface and mass of upper lobe of left lung, patient unable to position properly due to pain. Comparison Study: No previous exams Performing Technologist: Jody Hill RVT, RDMS  Examination Guidelines: A complete evaluation includes B-mode imaging, spectral Doppler, color Doppler, and power Doppler as needed of all accessible portions of each vessel. Bilateral testing is considered an integral part of a complete examination. Limited examinations for reoccurring indications may be performed as noted.  Right Findings: +----------+------------+---------+-----------+----------+--------------------+ RIGHT     CompressiblePhasicitySpontaneousProperties      Summary        +----------+------------+---------+-----------+----------+--------------------+ Subclavian               Yes       Yes                   patent by                                                              color/doppler     +----------+------------+---------+-----------+----------+--------------------+  Left Findings: +----------+------------+---------+-----------+----------+-----------------+ LEFT      CompressiblePhasicitySpontaneousProperties     Summary      +----------+------------+---------+-----------+----------+-----------------+ IJV           Full       Yes       Yes                                +----------+------------+---------+-----------+----------+-----------------+ Subclavian                                           Not visualized   +----------+------------+---------+-----------+----------+-----------------+ Axillary      None       No        No  through the chest wall up to 6 cm. Mass appears to have enlarged compared to CT 05/08/2023. 3. Masslike extension to the LEFT supraclavicular region and LEFT axilla. 4. AP window extension of the mass. 5. Anterior LEFT rib involvement and chronic compression fracture of midthoracic spine unchanged Electronically Signed   By: Genevive Bi M.D.   On: 05/26/2023 16:33   DG Chest Port 1 View  Result Date: 05/25/2023 CLINICAL DATA:  Shortness of breath, hypoxia EXAM: PORTABLE CHEST 1 VIEW COMPARISON:  05/13/2023 FINDINGS: Complete opacification of the left hemithorax. Right lung clear. No acute bony abnormality. IMPRESSION: Complete opacification of the left hemithorax. Electronically Signed   By: Charlett Nose M.D.   On: 05/25/2023 23:43   VAS [place] UPPER EXTREMITY VENOUS DUPLEX  Result Date: 05/17/2023 UPPER VENOUS STUDY  Patient Name:  Madison Ho  Date of Exam:   05/17/2023 Medical Rec #: [id number]       Accession #:    [id number] Date of Birth: [birth year]      Patient Gender: F Patient Age:   73 years Exam Location:  [facility] Procedure:      VAS [place] UPPER EXTREMITY VENOUS DUPLEX Referring Phys: Hazeline Junker  --------------------------------------------------------------------------------  Indications: Pain, and Swelling Risk Factors: Recent diagnosis of metastatic lung cancer. Limitations: Poor ultrasound/tissue interface and mass of upper lobe of left lung, patient unable to position properly due to pain. Comparison Study: No previous exams Performing Technologist: Jody Hill RVT, RDMS  Examination Guidelines: A complete evaluation includes B-mode imaging, spectral Doppler, color Doppler, and power Doppler as needed of all accessible portions of each vessel. Bilateral testing is considered an integral part of a complete examination. Limited examinations for reoccurring indications may be performed as noted.  Right Findings: +----------+------------+---------+-----------+----------+--------------------+ RIGHT     CompressiblePhasicitySpontaneousProperties      Summary        +----------+------------+---------+-----------+----------+--------------------+ Subclavian               Yes       Yes                   patent by                                                              color/doppler     +----------+------------+---------+-----------+----------+--------------------+  Left Findings: +----------+------------+---------+-----------+----------+-----------------+ LEFT      CompressiblePhasicitySpontaneousProperties     Summary      +----------+------------+---------+-----------+----------+-----------------+ IJV           Full       Yes       Yes                                +----------+------------+---------+-----------+----------+-----------------+ Subclavian                                           Not visualized   +----------+------------+---------+-----------+----------+-----------------+ Axillary      None       No        No  these medications    Accu-Chek Aviva Plus test strip Generic drug: glucose blood TEST BLOOD SUGAR EVERY DAY   Accu-Chek Aviva Plus w/Device Kit   Accu-Chek Aviva Soln USE AS DIRECTED WITH GLUCOMETER   Accu-Chek Softclix Lancet Dev Kit USE AS DIRECTED  TO CHECK BLOOD SUGAR ONE TIME DAILY   Accu-Chek Softclix Lancets lancets TEST BLOOD SUGAR EVERY DAY   apixaban 5 MG Tabs tablet Commonly known as: ELIQUIS Take 1 tablet (5 mg total) by mouth 2 (two) times daily.   B-D SINGLE USE SWABS REGULAR Pads USE AS DIRECTED EVERY DAY   lidocaine 5 % Commonly known as: Lidoderm Place 1 patch onto the skin daily. Remove & Discard patch within 12 hours or as directed by MD   metFORMIN 500 MG tablet Commonly known as: GLUCOPHAGE TAKE 1  TABLET WITH BREAKFAST, WITH LUNCH, AND WITH EVENING MEAL               Durable Medical Equipment  (From admission, onward)           Start     Ordered   05/27/23 1633  For home use only DME oxygen  Once       Question Answer Comment  Length of Need Lifetime   Mode or (Route) Nasal cannula   Liters per Minute 15   Frequency Continuous (stationary and portable oxygen unit needed)   Oxygen delivery system Gas      05/27/23 1633            Follow-up Information     [facility] Follow up.   Specialty: [facility] Contact information: [address] [place] [address] [phone]         Ignatius Specking, MD Follow up in 1 week(s).   Specialty: Internal Medicine Contact information: [address] [place] [address] [phone]                Allergies  Allergen Reactions   Penicillins Swelling    Has taken cipro with no problems    Consultations: Oncology, PCCM, Perative care   Procedures/Studies: [place] CHEST (PLEURAL EFFUSION)  Result Date: 05/28/2023 INDICATION: Stage IV non-small cell lung cancer with large left pleural effusion. Request for therapeutic thoracentesis. EXAM: CHEST ULTRASOUND COMPARISON:  CT CHEST WO CONTRAST 05/26/23 FINDINGS: Limited US Chest shows moderate pleural effusion on the left. No fluid identified on the right. IMPRESSION: Moderate left-sided pleural effusion. Patient unable to tolerate positioning due to severe pain and increased O2 requirement. No procedure performed. Performed by: Loyce Dys PA-C Electronically Signed   By: Corlis Leak M.D.   On: 05/28/2023 07:23   ECHOCARDIOGRAM COMPLETE  Result Date: 05/27/2023    ECHOCARDIOGRAM REPORT   Patient Name:   Madison Ho Date of Exam: 05/27/2023 Medical Rec #:  [id number]      Height:       65.0 in Accession #:    [id number]     Weight:       190.5 lb Date of Birth:  Oct 29, 1950     BSA:          1.938 m Patient Age:    71 years       BP:            101/60 mmHg Patient Gender: F              HR:           92 bpm. Exam Location:  Inpatient  through the chest wall up to 6 cm. Mass appears to have enlarged compared to CT 05/08/2023. 3. Masslike extension to the LEFT supraclavicular region and LEFT axilla. 4. AP window extension of the mass. 5. Anterior LEFT rib involvement and chronic compression fracture of midthoracic spine unchanged Electronically Signed   By: Genevive Bi M.D.   On: 05/26/2023 16:33   DG Chest Port 1 View  Result Date: 05/25/2023 CLINICAL DATA:  Shortness of breath, hypoxia EXAM: PORTABLE CHEST 1 VIEW COMPARISON:  05/13/2023 FINDINGS: Complete opacification of the left hemithorax. Right lung clear. No acute bony abnormality. IMPRESSION: Complete opacification of the left hemithorax. Electronically Signed   By: Charlett Nose M.D.   On: 05/25/2023 23:43   VAS [place] UPPER EXTREMITY VENOUS DUPLEX  Result Date: 05/17/2023 UPPER VENOUS STUDY  Patient Name:  Madison Ho  Date of Exam:   05/17/2023 Medical Rec #: [id number]       Accession #:    [id number] Date of Birth: [birth year]      Patient Gender: F Patient Age:   73 years Exam Location:  [facility] Procedure:      VAS [place] UPPER EXTREMITY VENOUS DUPLEX Referring Phys: Hazeline Junker  --------------------------------------------------------------------------------  Indications: Pain, and Swelling Risk Factors: Recent diagnosis of metastatic lung cancer. Limitations: Poor ultrasound/tissue interface and mass of upper lobe of left lung, patient unable to position properly due to pain. Comparison Study: No previous exams Performing Technologist: Jody Hill RVT, RDMS  Examination Guidelines: A complete evaluation includes B-mode imaging, spectral Doppler, color Doppler, and power Doppler as needed of all accessible portions of each vessel. Bilateral testing is considered an integral part of a complete examination. Limited examinations for reoccurring indications may be performed as noted.  Right Findings: +----------+------------+---------+-----------+----------+--------------------+ RIGHT     CompressiblePhasicitySpontaneousProperties      Summary        +----------+------------+---------+-----------+----------+--------------------+ Subclavian               Yes       Yes                   patent by                                                              color/doppler     +----------+------------+---------+-----------+----------+--------------------+  Left Findings: +----------+------------+---------+-----------+----------+-----------------+ LEFT      CompressiblePhasicitySpontaneousProperties     Summary      +----------+------------+---------+-----------+----------+-----------------+ IJV           Full       Yes       Yes                                +----------+------------+---------+-----------+----------+-----------------+ Subclavian                                           Not visualized   +----------+------------+---------+-----------+----------+-----------------+ Axillary      None       No        No  Physician Discharge Summary  Madison Ho DGU:[id number] DOB: 01-21-1951 DOA: 05/07/2023  PCP: Ignatius Specking, MD  Admit date: 05/07/2023 Discharge date: 05/28/2023 30 Day Unplanned Readmission Risk Score    Flowsheet Row ED to Hosp-Admission (Current) from 05/07/2023 in [facility]-ICU/STEPDOWN  30 Day Unplanned Readmission Risk Score (%) 15.06 Filed at 05/28/2023 0801       This score is the patient's risk of an unplanned readmission within 30 days of being discharged (0 -100%). The score is based on dignosis, age, lab data, medications, orders, and past utilization.   Low:  0-14.9   Medium: 15-21.9   High: 22-29.9   Extreme: 30 and above          Admitted From: Home Disposition: Home with hospice  Recommendations for Outpatient Follow-up:  Follow up with PCP in 1-2 weeks Please obtain BMP/CBC in one week Please follow up with your PCP on the following pending results: Unresulted Labs (From admission, onward)    None         Home Health: Yes/Home with hospice Equipment/Devices: Home oxygen  Discharge Condition: Guarded CODE STATUS: DNR Diet recommendation: Cardiac  Subjective: Seen and examined.  Despite receiving high amount of oxygen, she has no shortness of breath.  Daughter at the bedside.  Plan confirmed that she still wants to go home and spend the rest of the days of her life at home with her cat and family.  Brief/Interim Summary: 72 year old female with past medical history of hypertension, hyperlipidemia, diabetes mellitus type 2, COPD and neuropathy who presented to [facility] emergency department with left arm and shoulder pain 40 pound weight loss and failure to thrive.   Patient was admitted to hospital service. Workup included CT imaging which revealed a large left lung mass concerning for primary lung malignancy.  Biopsy of left upper lobe mass on 10/9 was consistent with squamous cell lung cancer showing local invasion,  right adrenal metastases and a solitary 8 mm intracranial metastasis on MRI.  Dr. Cherly Hensen with Medical Oncology was consulted as well as radiation oncology.  Patient was transferred to [facility] for initiation of palliative XRT, 10 fractions of palliative chest radiation.  Palliative care is also been consulted for their assistance.     Hospital course has had multiple complications including an episode of observed aspiration on 10/14.  Patient was briefly made n.p.o. and speech therapy evaluation was carried out followed by modified barium swallow on 10/16.  An appropriate dysphagia 2 diet was then ordered.  Additionally patient developed a left upper extremity DVT identified on ultrasound on 10/17 for which Lovenox was initiated.  Details below.   Acute hypoxic respiratory failure secondary to worsening squamous cell lung cancer, left (HCC) Identification of large left lung mass upon this admission determined to be squamous cell lung cancer. Mass is substantial in size measuring 13 x 15 cm x 17 cm with encasement of the left main pulmonary artery with mass effect as well as abutting the distal trachea, encasing the left mainstem bronchus, occluding the left upper lobe bronchus and abutting the thoracic aorta. Started radiation therapy.  She received couple of sessions, plan was to do 10 sessions but patient got sicker with acute hypoxic respiratory failure and left pleural effusion on 05/25/2023, transferred to stepdown, chest x-ray showed complete opacification of the left lung, PCCM consulted, CT chest shows enlarging mass.  Long discussion with son and daughter and patient by me, PCCM and palliative care, patient decided against  Acute       +----------+------------+---------+-----------+----------+-----------------+ Brachial      Full                                                     +----------+------------+---------+-----------+----------+-----------------+ Radial        Full                                                    +----------+------------+---------+-----------+----------+-----------------+ Ulnar                                                Not visualized   +----------+------------+---------+-----------+----------+-----------------+ Cephalic      None       No        No               Age Indeterminate +----------+------------+---------+-----------+----------+-----------------+ Basilic       Full       Yes       Yes                                +----------+------------+---------+-----------+----------+-----------------+ Sluggish flow seen in IJV and waveform phasic but diminished. Unable to visualize subclavian vein due to overlying mass. Ulner veins not visualized due to overlying edema and patient inability to move arm.  Summary:  Right: No evidence of thrombosis in the subclavian.  Left: Findings consistent with acute deep vein thrombosis involving the left axillary vein. Findings consistent with age indeterminate superficial vein thrombosis involving the forearm portion of left cephalic vein. However, unable to visualize the subclavian and ulnar veins. Subcutaneous edema extending from mid upper arm to wrist,  *See table(s) above for measurements and observations.  Diagnosing physician: Heath Lark Electronically signed by Heath Lark on 05/17/2023 at 4:20:10 PM.    Final    DG Swallowing Func-Speech Pathology  Result Date: 05/16/2023 Table formatting from the original result was not included. Modified Barium Swallow Study Patient Details Name: Madison Ho MRN: [id number] Date of Birth: [birth year] Today's Date: 05/16/2023 HPI/PMH: HPI: 72 year old home dwelling female admitted and found to have lung mass and now brain mets. Pt observed to cough with meals. PMH: HTN HLD DM TY 2 BMI 30  Prior  smoker until about 2 months ago  Came to emergency room 10/8 with left shoulder pain 40 pound weight loss failure to thrive low-grade fever temp tachycardia and hypotension responsive to IV fluid . CT scan = large left lung mass concerning for primary lung malignancy-  10/9 biopsy left upper lobe showing metastatic squamous cell CA   10/11 oncology consult 10-CT 4N2M1 SCC lung. MRI brain shows Solitary 8 mm intracranial metastasis at the anterior/inferior  aspect of the left lentiform nucleus. No significant mass effect.  CXR showed extensive left upper lobe  lung mass with veil like opacification over the left lower lobe.  2. Signs of venous congestion within the right lung. Clinical Impression: Clinical Impression: Patient presents with mild oropharyngeal dysphagia mostly characterized by impaired lingual control resulting  Physician Discharge Summary  Madison Ho DGU:[id number] DOB: 01-21-1951 DOA: 05/07/2023  PCP: Ignatius Specking, MD  Admit date: 05/07/2023 Discharge date: 05/28/2023 30 Day Unplanned Readmission Risk Score    Flowsheet Row ED to Hosp-Admission (Current) from 05/07/2023 in [facility]-ICU/STEPDOWN  30 Day Unplanned Readmission Risk Score (%) 15.06 Filed at 05/28/2023 0801       This score is the patient's risk of an unplanned readmission within 30 days of being discharged (0 -100%). The score is based on dignosis, age, lab data, medications, orders, and past utilization.   Low:  0-14.9   Medium: 15-21.9   High: 22-29.9   Extreme: 30 and above          Admitted From: Home Disposition: Home with hospice  Recommendations for Outpatient Follow-up:  Follow up with PCP in 1-2 weeks Please obtain BMP/CBC in one week Please follow up with your PCP on the following pending results: Unresulted Labs (From admission, onward)    None         Home Health: Yes/Home with hospice Equipment/Devices: Home oxygen  Discharge Condition: Guarded CODE STATUS: DNR Diet recommendation: Cardiac  Subjective: Seen and examined.  Despite receiving high amount of oxygen, she has no shortness of breath.  Daughter at the bedside.  Plan confirmed that she still wants to go home and spend the rest of the days of her life at home with her cat and family.  Brief/Interim Summary: 72 year old female with past medical history of hypertension, hyperlipidemia, diabetes mellitus type 2, COPD and neuropathy who presented to [facility] emergency department with left arm and shoulder pain 40 pound weight loss and failure to thrive.   Patient was admitted to hospital service. Workup included CT imaging which revealed a large left lung mass concerning for primary lung malignancy.  Biopsy of left upper lobe mass on 10/9 was consistent with squamous cell lung cancer showing local invasion,  right adrenal metastases and a solitary 8 mm intracranial metastasis on MRI.  Dr. Cherly Hensen with Medical Oncology was consulted as well as radiation oncology.  Patient was transferred to [facility] for initiation of palliative XRT, 10 fractions of palliative chest radiation.  Palliative care is also been consulted for their assistance.     Hospital course has had multiple complications including an episode of observed aspiration on 10/14.  Patient was briefly made n.p.o. and speech therapy evaluation was carried out followed by modified barium swallow on 10/16.  An appropriate dysphagia 2 diet was then ordered.  Additionally patient developed a left upper extremity DVT identified on ultrasound on 10/17 for which Lovenox was initiated.  Details below.   Acute hypoxic respiratory failure secondary to worsening squamous cell lung cancer, left (HCC) Identification of large left lung mass upon this admission determined to be squamous cell lung cancer. Mass is substantial in size measuring 13 x 15 cm x 17 cm with encasement of the left main pulmonary artery with mass effect as well as abutting the distal trachea, encasing the left mainstem bronchus, occluding the left upper lobe bronchus and abutting the thoracic aorta. Started radiation therapy.  She received couple of sessions, plan was to do 10 sessions but patient got sicker with acute hypoxic respiratory failure and left pleural effusion on 05/25/2023, transferred to stepdown, chest x-ray showed complete opacification of the left lung, PCCM consulted, CT chest shows enlarging mass.  Long discussion with son and daughter and patient by me, PCCM and palliative care, patient decided against

## 2023-05-28 NOTE — Discharge Instructions (Addendum)
Information on my medicine - ELIQUIS (apixaban)  This medication education was reviewed with me or my healthcare representative as part of my discharge preparation.  The pharmacist that spoke with me during my hospital stay was: Farrel Demark, Student-PharmD  Why was Eliquis prescribed for you? Eliquis was prescribed to treat blood clots that may have been found in the veins of your legs (deep vein thrombosis) or in your lungs (pulmonary embolism) and to reduce the risk of them occurring again.  What do You need to know about Eliquis ? The dose is 5 mg tablet taken TWICE daily.  Eliquis may be taken with or without food.   Try to take the dose about the same time in the morning and in the evening. If you have difficulty swallowing the tablet whole please discuss with your pharmacist how to take the medication safely.  Take Eliquis exactly as prescribed and DO NOT stop taking Eliquis without talking to the doctor who prescribed the medication.  Stopping may increase your risk of developing a new blood clot.  Refill your prescription before you run out.  After discharge, you should have regular check-up appointments with your healthcare provider that is prescribing your Eliquis.    What do you do if you miss a dose? If a dose of ELIQUIS is not taken at the scheduled time, take it as soon as possible on the same day and twice-daily administration should be resumed. The dose should not be doubled to make up for a missed dose.  Important Safety Information A possible side effect of Eliquis is bleeding. You should call your healthcare provider right away if you experience any of the following: Bleeding from an injury or your nose that does not stop. Unusual colored urine (red or dark brown) or unusual colored stools (red or black). Unusual bruising for unknown reasons. A serious fall or if you hit your head (even if there is no bleeding).  Some medicines may interact with Eliquis and  might increase your risk of bleeding or clotting while on Eliquis. To help avoid this, consult your healthcare provider or pharmacist prior to using any new prescription or non-prescription medications, including herbals, vitamins, non-steroidal anti-inflammatory drugs (NSAIDs) and supplements.  This website has more information on Eliquis (apixaban): http://www.eliquis.com/eliquis/home

## 2023-05-28 NOTE — TOC Progression Note (Signed)
Transition of Care [facility]) - Progression Note    Patient Details  Name: Madison Ho MRN: [id number] Date of Birth: 01-27-1951  Transition of Care [facility]) CM/SW Contact  Darleene Cleaver, [place] Phone Number: 05/28/2023, 10:25 AM  Clinical Narrative:     CSW spoke to Thea Gist at [facility] to get an update on if the DME has been ordered and delivered.  Per [place] the equipment has been ordered, but has not been delivered yet.  Per Misty she will check on an update shortly and let CSW know.  Once equipment has been delivered, CSW will arrange for transport home.  CSW to continue to facilitate discharge planning.   Expected Discharge Plan: Skilled Nursing Facility Barriers to Discharge: Continued Medical Work up  Expected Discharge Plan and Services In-house Referral: Clinical Social Work Discharge Planning Services: CM Consult Post Acute Care Choice: Home Health Living arrangements for the past 2 months: Single Family Home Expected Discharge Date: 05/28/23               DME Arranged: N/A         HH Arranged: PT HH Agency: [facility] Date HH Agency Contacted: 05/11/23 Time HH Agency Contacted: 1145 Representative spoke with at [facility] Agency: Tresa Endo   Social Determinants of Health (SDOH) Interventions SDOH Screenings   Food Insecurity: No Food Insecurity (05/08/2023)  Housing: Low Risk  (05/08/2023)  Transportation Needs: No Transportation Needs (05/08/2023)  Utilities: Not At Risk (05/08/2023)  Alcohol Screen: Low Risk  (11/10/2020)  Depression (PHQ2-9): Low Risk  (11/10/2020)  Financial Resource Strain: Low Risk  (11/10/2020)  Physical Activity: Inactive (11/10/2020)  Social Connections: Moderately Isolated (11/10/2020)  Stress: No Stress Concern Present (11/10/2020)  Tobacco Use: High Risk (05/07/2023)    Readmission Risk Interventions     No data to display

## 2023-05-28 NOTE — Progress Notes (Signed)
Daily Progress Note   Patient Name: Madison Ho       Date: 05/28/2023 DOB: Mar 01, 1951  Age: 72 y.o. MRN#: [id number] Attending Physician: Hughie Closs, MD Primary Care Physician: Ignatius Specking, MD Admit Date: 05/07/2023 Length of Stay: 20 days  Reason for Consultation/Follow-up: Establishing goals of care  Subjective:   CC: Patient having pain in L hand. Following up regarding complex medical decision making.   Subjective:  Reviewed prior to presenting to bed side.  At time of EMR review patient had received IV morphine 2 mg x 4 doses.  Had not received any oral morphine doses.  Presented to bedside to see patient.  Patient's daughter present at bedside.  Noted they are planning to go home with [facility] hospice support today.  Daughter waiting on equipment to be delivered.  Patient's only concern at this time as she was having worsening left hand pain.  Noted worsening left arm edema secondary to compression from her underlying cancer.  Noted use of pain medications appropriate for this.  Patient requesting dose of pain medication at this time so was able to inform RN.  All questions answered at that time.  Hope patient had safe travels home to enjoy time with family.  Discussed care with IDT who is assisting with home hospice support via [facility] today.  Objective:   Vital Signs:  BP (!) 117/52   Pulse 99   Temp 98.5 F (36.9 C) (Axillary)   Resp (!) 26   Ht 5\' 5"  (1.651 m)   Wt 86.4 kg   SpO2 95%   BMI 31.70 kg/m   Physical Exam: General: Ill-appearing, debilitated, laying in bed HENT: Dry mucous membranes Cardiovascular: RRR Respiratory: increased work of breathing noted, on high flow nasal cannula 15 L/min Neuro: Alert, interactive, appropriately answering questions, following commands easily Psych: appropriately answers all questions  Imaging: I personally reviewed recent imaging.   Assessment & Plan:   Assessment: Patient is a 72 y.o. female with past medical  history of hypertension, hyperlipidemia, diabetes, neuropathy, obesity admitted on 05/07/2023 with significant weight loss, weakness, left shoulder pain and left chest swelling. Diagnosed with potential UTI and aspiration pneumonia but also with metastatic lung care to right adrenal, brain. Tentative plans for SRS radiation to brain but with ongoing decline will reassess goals of care before proceeding with treatment.   Recommendations/Plan: # Complex medical decision making/goals of care:  -Planing for patient to go home with family today with [facility] hospice support.   -  Code Status: Limited: Do not attempt resuscitation (DNR) -DNR-LIMITED -Do Not Intubate/DNI   # Symptom management:  -Pain/dyspnea, in setting of static lung cancer   -Continue oral morphine solution 2.5-5mg  q2hrs prn , Please provide at discharge to allow medications for comfort at home.    -Discontinue IV morphine -Tylenol 1000 mg p.o. around-the-clock every 8 hours - Gabapentin 100 mg twice daily - Lidocaine 5% patch transdermal over 12 hours every 24 hours  # Psychosocial Support:  -son, daughter  # Discharge Planning: Home with Hospice, hopefully today  Discussed with: patient, hospitalist, Acc liaison, TOC, RN, patient's daughter  Thank you for allowing the palliative care team to participate in the care [employer].  Alvester Morin, DO Palliative Care Provider PMT # [phone]  If patient remains symptomatic despite maximum doses, please call PMT at [phone] between 0700 and 1900. Outside of these hours, please call attending, as PMT does not have night coverage.  *Please note that this is a verbal dictation  therefore any spelling or grammatical errors are due to the "[employer] One" system interpretation.

## 2023-05-28 NOTE — Plan of Care (Signed)
  Problem: Fluid Volume: Goal: Ability to maintain a balanced intake and output will improve Outcome: Progressing   Problem: Nutritional: Goal: Maintenance of adequate nutrition will improve Outcome: Progressing   Problem: Clinical Measurements: Goal: Cardiovascular complication will be avoided Outcome: Progressing   Problem: Nutrition: Goal: Adequate nutrition will be maintained Outcome: Progressing   Problem: Coping: Goal: Ability to adjust to condition or change in health will improve Outcome: Not Progressing   Problem: Health Behavior/Discharge Planning: Goal: Ability to manage health-related needs will improve Outcome: Not Progressing   Problem: Clinical Measurements: Goal: Respiratory complications will improve Outcome: Not Progressing   Problem: Activity: Goal: Risk for activity intolerance will decrease Outcome: Not Progressing   Problem: Coping: Goal: Level of anxiety will decrease Outcome: Not Progressing   Problem: Pain Managment: Goal: General experience of comfort will improve Outcome: Not Progressing

## 2023-05-28 NOTE — Progress Notes (Signed)
Noted plans for discharge with hospice. I spoke with daughter Babette Relic and she confirms this. We will cancel remaining XRT and MRI orders.     Osker Mason, PAC

## 2023-05-28 NOTE — TOC Transition Note (Signed)
Transition of Care [facility]) - CM/SW Discharge Note   Patient Details  Name: Madison Ho MRN: [id number] Date of Birth: 08/18/1950  [facility]) CM/SW Contact:  Darleene Cleaver, LCSW Phone Number: 05/28/2023, 12:44 PM   Clinical Narrative:     CSW spoke to patient's daughter Tammy to find out if equipment has been delivered.  Per Tammy it has been delivered.  CSW confirmed with Misty from [facility] that everything is set up from her perspective, she confirmed it is.  CSW confirmed with attending physician and bedside nurse that patient is ready for transport to be called.  Patient will be going home with home hospice through [facility]. TOC signing off please reconsult with any other TOC needs, home hospice agency has been notified of planned discharge.  CSW notified Tresa Endo from [facility] that patient is going home with hospice instead of home health.    TOC to contact EMS for transport home.    12:48pm CSW called [facility] at [phone], and she said hopefully within an hour for transport.    Final next level of care: Home w Hospice Care Barriers to Discharge: Barriers Resolved   Patient Goals and CMS Choice CMS Medicare.gov Compare Post Acute Care list provided to:: Patient Represenative (must comment) Choice offered to / list presented to : Adult Children  Discharge Placement                         Discharge Plan and Services Additional resources added to the After Visit Summary for   In-house Referral: Clinical Social Work Discharge Planning Services: CM Consult Post Acute Care Choice: Home Health          DME Arranged: Hospital bed, Oxygen DME Agency: Other - Comment [facility]ospice services.) Date DME Agency Contacted: 05/28/23 Time DME Agency Contacted: 0930 Representative spoke with at DME Agency: Misty HH Arranged: PT HH Agency: [facility] Date [facility] Agency Contacted: 05/11/23 Time HH Agency Contacted:  1145 Representative spoke with at [facility] Agency: Tresa Endo  Social Determinants of Health (SDOH) Interventions SDOH Screenings   Food Insecurity: No Food Insecurity (05/08/2023)  Housing: Low Risk  (05/08/2023)  Transportation Needs: No Transportation Needs (05/08/2023)  Utilities: Not At Risk (05/08/2023)  Alcohol Screen: Low Risk  (11/10/2020)  Depression (PHQ2-9): Low Risk  (11/10/2020)  Financial Resource Strain: Low Risk  (11/10/2020)  Physical Activity: Inactive (11/10/2020)  Social Connections: Moderately Isolated (11/10/2020)  Stress: No Stress Concern Present (11/10/2020)  Tobacco Use: High Risk (05/07/2023)     Readmission Risk Interventions     No data to display

## 2023-05-29 ENCOUNTER — Ambulatory Visit: Payer: Medicare PPO

## 2023-05-29 NOTE — Progress Notes (Signed)
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     Indication for treatment:   palliative       Radiation treatment dates:   05/17/23-05/25/23  Site/planned dose:   The left lung target was to be treated to 30 Gy in 10 fractions. She received 4 fractions though her treatment had to be replanned after 2 fractions due to pain with positioning. She received a total of 12 Gy.  Narrative: The patient's status declined during her course of radiation, and the patient elected to forgo the remaining radiation treatments and did not wish to proceed with any radiation to the brain.  Plan: The patient will discharge with hospice at the conclusion of her hospital stay.     Osker Mason, PAC

## 2023-05-29 NOTE — Radiation Completion Notes (Signed)
Patient Name: Madison Ho, Madison Ho MRN: [id number] Date of Birth: 1951/02/09 Referring Physician: Geanie Berlin, M.D. Date of Service: 2023-05-29 Radiation Oncologist: Dorothy Puffer, M.D. [facility]                             RADIATION ONCOLOGY END OF TREATMENT NOTE     Diagnosis: C34.12 Malignant neoplasm of upper lobe, left bronchus or lung Intent: Palliative     ==========DELIVERED PLANS==========  First Treatment Date: 2023-05-17 - Last Treatment Date: 2023-05-25   Plan Name: Lung_L Site: Lung, Left Technique: 3D Mode: Photon Dose Per Fraction: 3 Gy Prescribed Dose (Delivered / Prescribed): 6 Gy / 6 Gy Prescribed Fxs (Delivered / Prescribed): 2 / 2   Plan Name: Lung_L:1 Site: Lung, Left Technique: 3D Mode: Photon Dose Per Fraction: 3 Gy Prescribed Dose (Delivered / Prescribed): 6 Gy / 24 Gy Prescribed Fxs (Delivered / Prescribed): 2 / 8     ==========ON TREATMENT VISIT DATES========== 2023-05-18     ==========UPCOMING VISITS==========       ==========APPENDIX - ON TREATMENT VISIT NOTES==========   See weekly On Treatment Notes in Epic for details.

## 2023-05-30 ENCOUNTER — Ambulatory Visit: Payer: Medicare PPO

## 2023-05-31 ENCOUNTER — Ambulatory Visit: Payer: Medicare PPO

## 2023-06-01 ENCOUNTER — Ambulatory Visit: Payer: Medicare PPO

## 2023-06-04 ENCOUNTER — Ambulatory Visit: Payer: Medicare PPO

## 2023-07-01 DEATH — deceased

## 2023-07-02 ENCOUNTER — Ambulatory Visit
Admission: RE | Admit: 2023-07-02 | Discharge: 2023-07-02 | Disposition: A | Discharge status: Home / Self Care | Payer: Medicare PPO | Source: Ambulatory Visit | Attending: Radiation Oncology | Admitting: Radiation Oncology

## 2023-07-02 NOTE — Progress Notes (Addendum)
Post-Treat Phone call  Spoke w/ patient's daughter Ms. Marian Sorrow, and she confirms that patient has passed away on 2023-06-30. I offered my condolences.  This concludes the interaction.  Ruel Favors, LPN
